# Patient Record
Sex: Female | Born: 1940 | Race: White | Hispanic: No | Marital: Married | Smoking: Never smoker
Health system: Southern US, Community
[De-identification: ages and names within clinical notes are randomized; demographics above are authoritative.]

## PROBLEM LIST (undated history)

## (undated) DIAGNOSIS — H269 Unspecified cataract: Secondary | ICD-10-CM

## (undated) DIAGNOSIS — Z923 Personal history of irradiation: Secondary | ICD-10-CM

## (undated) DIAGNOSIS — T7840XA Allergy, unspecified, initial encounter: Secondary | ICD-10-CM

## (undated) DIAGNOSIS — M1990 Unspecified osteoarthritis, unspecified site: Secondary | ICD-10-CM

## (undated) DIAGNOSIS — E079 Disorder of thyroid, unspecified: Secondary | ICD-10-CM

## (undated) HISTORY — DX: Disorder of thyroid, unspecified: E07.9

## (undated) HISTORY — DX: Unspecified cataract: H26.9

## (undated) HISTORY — PX: KNEE SURGERY: SHX244

## (undated) HISTORY — PX: DG  BONE DENSITY (ARMC HX): HXRAD1102

## (undated) HISTORY — DX: Unspecified osteoarthritis, unspecified site: M19.90

## (undated) HISTORY — PX: TUBAL LIGATION: SHX77

## (undated) HISTORY — DX: Allergy, unspecified, initial encounter: T78.40XA

---

## 2002-10-06 ENCOUNTER — Encounter: Payer: Self-pay | Admitting: Internal Medicine

## 2002-10-06 ENCOUNTER — Encounter: Admission: RE | Admit: 2002-10-06 | Discharge: 2002-10-06 | Payer: Self-pay | Admitting: Internal Medicine

## 2002-10-15 ENCOUNTER — Ambulatory Visit (HOSPITAL_COMMUNITY): Admission: RE | Admit: 2002-10-15 | Discharge: 2002-10-15 | Payer: Self-pay | Admitting: Gastroenterology

## 2002-12-22 ENCOUNTER — Encounter: Admission: RE | Admit: 2002-12-22 | Discharge: 2002-12-22 | Payer: Self-pay | Admitting: Internal Medicine

## 2002-12-22 ENCOUNTER — Encounter: Payer: Self-pay | Admitting: Internal Medicine

## 2003-11-09 ENCOUNTER — Encounter: Admission: RE | Admit: 2003-11-09 | Discharge: 2003-11-09 | Payer: Self-pay | Admitting: Internal Medicine

## 2004-11-14 ENCOUNTER — Encounter: Admission: RE | Admit: 2004-11-14 | Discharge: 2004-11-14 | Payer: Self-pay | Admitting: Internal Medicine

## 2005-11-18 ENCOUNTER — Encounter: Admission: RE | Admit: 2005-11-18 | Discharge: 2005-11-18 | Payer: Self-pay | Admitting: Internal Medicine

## 2007-03-20 ENCOUNTER — Encounter: Admission: RE | Admit: 2007-03-20 | Discharge: 2007-03-20 | Payer: Self-pay | Admitting: Emergency Medicine

## 2008-03-28 ENCOUNTER — Encounter: Admission: RE | Admit: 2008-03-28 | Discharge: 2008-03-28 | Payer: Self-pay | Admitting: Emergency Medicine

## 2009-03-31 ENCOUNTER — Encounter: Admission: RE | Admit: 2009-03-31 | Discharge: 2009-03-31 | Payer: Self-pay | Admitting: Emergency Medicine

## 2010-04-01 ENCOUNTER — Encounter: Payer: Self-pay | Admitting: Emergency Medicine

## 2010-04-09 ENCOUNTER — Encounter
Admission: RE | Admit: 2010-04-09 | Discharge: 2010-04-09 | Payer: Self-pay | Source: Home / Self Care | Attending: Emergency Medicine | Admitting: Emergency Medicine

## 2010-05-14 ENCOUNTER — Other Ambulatory Visit: Payer: Self-pay | Admitting: Emergency Medicine

## 2010-05-14 ENCOUNTER — Ambulatory Visit
Admission: RE | Admit: 2010-05-14 | Discharge: 2010-05-14 | Disposition: A | Discharge status: Home / Self Care | Payer: Self-pay | Source: Ambulatory Visit | Attending: Emergency Medicine | Admitting: Emergency Medicine

## 2010-05-14 DIAGNOSIS — M25569 Pain in unspecified knee: Secondary | ICD-10-CM

## 2010-06-05 ENCOUNTER — Ambulatory Visit (HOSPITAL_BASED_OUTPATIENT_CLINIC_OR_DEPARTMENT_OTHER)
Admission: RE | Admit: 2010-06-05 | Discharge: 2010-06-05 | Disposition: A | Discharge status: Home / Self Care | Payer: Medicare Other | Source: Ambulatory Visit | Attending: Orthopaedic Surgery | Admitting: Orthopaedic Surgery

## 2010-06-05 DIAGNOSIS — Z01810 Encounter for preprocedural cardiovascular examination: Secondary | ICD-10-CM | POA: Insufficient documentation

## 2010-06-05 DIAGNOSIS — Z01812 Encounter for preprocedural laboratory examination: Secondary | ICD-10-CM | POA: Insufficient documentation

## 2010-06-05 DIAGNOSIS — M2240 Chondromalacia patellae, unspecified knee: Secondary | ICD-10-CM | POA: Insufficient documentation

## 2010-06-05 DIAGNOSIS — M23329 Other meniscus derangements, posterior horn of medial meniscus, unspecified knee: Secondary | ICD-10-CM | POA: Insufficient documentation

## 2010-06-05 LAB — POCT HEMOGLOBIN-HEMACUE: Hemoglobin: 12.7 g/dL (ref 12.0–15.0)

## 2010-06-16 NOTE — Op Note (Signed)
NAMEKJERSTEN, ORMISTON NO.:  [id number]  MEDICAL RECORD NO.:  [id number]           PATIENT TYPE:  LOCATION:                                 FACILITY:  PHYSICIAN:  Lubertha Basque. Jerl Santos, M.D.     DATE OF BIRTH:  DATE OF PROCEDURE:  06/05/2010 DATE OF DISCHARGE:                              OPERATIVE REPORT   PREOPERATIVE DIAGNOSES: 1. Left knee torn medial meniscus. 2. Left knee chondromalacia.  POSTOPERATIVE DIAGNOSES: 1. Left knee torn medial meniscus. 2. Left knee chondromalacia.  PROCEDURES: 1. Left knee partial medial meniscectomy. 2. Left knee abrasion, chondroplasty, patellofemoral.  ANESTHESIA:  General.  ATTENDING SURGEON:  Lubertha Basque. Jerl Santos, MD  ASSISTANT:  Lindwood Qua, PA   INDICATIONS FOR PROCEDURE:  The patient is a 70 year old woman with a long history of intense left knee pain.  She has persisted with difficulty despite oral anti-inflammatories and an injection.  By MRI scan, she has a radial tear of the posterior horn of the medial meniscus.  She has pain which limits her ability to walk and rest, and she is offered an arthroscopy.  Informed operative consent was obtained after discussion of possible complications including reaction to anesthesia and infection.  SUMMARY, FINDINGS, AND PROCEDURE:  Under general anesthesia, an arthroscopy of the left knee was performed.  Suprapatellar pouch was benign while the patellofemoral joint exhibited some grade 3 and focal grade 4 change addressed with abrasion to bleeding bone in one tiny area along with a chondroplasty.  Medial compartment did exhibit a large radial tear of the posterior horn which went back to the root of the meniscus.  About 15% partial medial meniscectomy was required contouring this area and removing a substantial portion of the posterior horn at the root attachment.  She had some grade 3 changes here.  ACL was intact and the lateral compartment was  benign.  DESCRIPTION OF THE PROCEDURE:  The patient was taken to the operating suite where general anesthetic was applied without difficulty.  She was positioned supine and prepped and draped in normal sterile fashion. After administration of IV vancomycin, arthroscopy of the left knee was performed through total of two portals.  Findings were as noted above, and procedure consisted of the partial medial meniscectomy done with basket and shaver involving the posterior horn.  A brief chondroplasty was done in this compartment.  She also had abrasion to bleeding bone, patellofemoral.  The knee was thoroughly irrigated followed by placement of Marcaine with epinephrine and morphine.  Adaptic was placed over the portals, followed by dry gauze and loose Ace wrap.  Estimated blood loss and intraoperative fluids obtained from anesthesia records.  DISPOSITION:  The patient was extubated in the operating room and taken to recovery room in stable condition.  She was to go home same day and follow up in the office in less than 1 week.  I will contact her by phone tonight.     Lubertha Basque Jerl Santos, M.D.     PGD/MEDQ  D:  06/05/2010  T:  06/06/2010  Job:  [id number]  Electronically Signed by Marcene Corning  M.D. on 06/16/2010 01:12:27 PM

## 2010-07-27 NOTE — Op Note (Signed)
NAME:  Nicole Brennan, Nicole Brennan                                ACCOUNT NO.:  [id number]   MEDICAL RECORD NO.:  [id number]                   PATIENT TYPE:  AMB   LOCATION:  ENDO                                 FACILITY:  [facility]   PHYSICIAN:  Anselmo Rod, M.D.               DATE OF BIRTH:  [birth year]   DATE OF PROCEDURE:  10/15/2002  DATE OF DISCHARGE:                                 OPERATIVE REPORT   PROCEDURE:  Screening colonoscopy.   ENDOSCOPIST:  Charna Elizabeth, M.D.   INSTRUMENT USED:  Olympus video colonoscope.   INDICATIONS FOR PROCEDURE:  A 70 year old white female with a family history  of colon in her mother.  She is undergoing screening colonoscopy to rule out  colonic polyps, masses, etc.   PREPROCEDURE PREPARATION:  Informed consent was procured from the patient.  The patient fasted for eight hours prior to the procedure and prepped with a  bottle of magnesium citrate and a gallon of GOLYTELY the night prior to the  procedure.   PREPROCEDURE PHYSICAL EXAMINATION:  VITAL SIGNS:  The patient had stable  vital signs.  NECK:  Supple.  CHEST:  Clear to auscultation.  HEART:  S1 and S2 regular.  ABDOMEN:  Soft with normal bowel sounds.   DESCRIPTION OF PROCEDURE:  The patient was placed in the left lateral  decubitus position, sedated with 60 mg of Demerol and 6 mg of Versed  intravenously.  Once the patient was adequately sedated and maintained on  low flow oxygen and continuous cardiac monitoring, the Olympus video  colonoscope was advanced from the rectum to the cecum.  The appendiceal  orifice and ileocecal valve were visualized and photographed.  There was  some residual stool in the colon.  Multiple washings were done.  A few  scattered diverticula were seen throughout the colon in other stages of  formation.  Small internal hemorrhoids were seen on retroflexion in the  rectum.  No masses or polyps were noted.  The patient tolerated the  procedure well without  complications.   IMPRESSION:  1. Small nonbleeding internal hemorrhoids.  2. Scattered early diverticular disease.  3. No masses or polyps seen.   RECOMMENDATIONS:  1. A high fiber diet with liberal fluid intake has been advocated.  2.     Considering her family history of colon cancer in a first degree relative,     repeat colonoscopy has been recommended in the next 5 years unless the     patient develops any abnormal symptoms.  3. Outpatient followup as need arises in the future.                                               Anselmo Rod, M.D.  JNM/MEDQ  D:  10/15/2002  T:  10/15/2002  Job:  [id number]   cc:   Olene Craven, M.D.  [address]  [address]  [place]  [place] [address]  Fax: [phone]

## 2011-03-13 ENCOUNTER — Other Ambulatory Visit: Payer: Self-pay | Admitting: Emergency Medicine

## 2011-03-13 DIAGNOSIS — Z1231 Encounter for screening mammogram for malignant neoplasm of breast: Secondary | ICD-10-CM

## 2011-04-11 ENCOUNTER — Ambulatory Visit
Admission: RE | Admit: 2011-04-11 | Discharge: 2011-04-11 | Disposition: A | Discharge status: Home / Self Care | Payer: Medicare Other | Source: Ambulatory Visit | Attending: Emergency Medicine | Admitting: Emergency Medicine

## 2011-04-11 DIAGNOSIS — Z1231 Encounter for screening mammogram for malignant neoplasm of breast: Secondary | ICD-10-CM

## 2011-04-19 ENCOUNTER — Other Ambulatory Visit: Payer: Self-pay | Admitting: Physician Assistant

## 2011-04-19 MED ORDER — LEVOTHYROXINE SODIUM 75 MCG PO TABS: 75.0000 ug | ORAL_TABLET | Freq: Every day | ORAL | Status: DC

## 2011-04-30 ENCOUNTER — Encounter: Payer: Self-pay | Admitting: Physician Assistant

## 2011-04-30 ENCOUNTER — Ambulatory Visit (INDEPENDENT_AMBULATORY_CARE_PROVIDER_SITE_OTHER): Payer: Medicare Other | Admitting: Emergency Medicine

## 2011-04-30 ENCOUNTER — Ambulatory Visit: Payer: Medicare Other

## 2011-04-30 DIAGNOSIS — E039 Hypothyroidism, unspecified: Secondary | ICD-10-CM

## 2011-04-30 DIAGNOSIS — Z13220 Encounter for screening for lipoid disorders: Secondary | ICD-10-CM

## 2011-04-30 DIAGNOSIS — M1990 Unspecified osteoarthritis, unspecified site: Secondary | ICD-10-CM | POA: Insufficient documentation

## 2011-04-30 DIAGNOSIS — M7989 Other specified soft tissue disorders: Secondary | ICD-10-CM

## 2011-04-30 DIAGNOSIS — Z0000 Encounter for general adult medical examination without abnormal findings: Secondary | ICD-10-CM

## 2011-04-30 LAB — POCT URINALYSIS DIPSTICK
Bilirubin, UA: NEGATIVE
Blood, UA: NEGATIVE
Glucose, UA: NEGATIVE
Spec Grav, UA: 1.02

## 2011-04-30 LAB — LIPID PANEL
Cholesterol: 189 mg/dL (ref 0–200)
Triglycerides: 98 mg/dL (ref <150)

## 2011-04-30 LAB — CBC WITH DIFFERENTIAL/PLATELET
Basophils Relative: 1 % (ref 0–1)
HCT: 40.8 % (ref 36.0–46.0)
Hemoglobin: 13.6 g/dL (ref 12.0–15.0)
Lymphocytes Relative: 37 % (ref 12–46)
MCHC: 33.3 g/dL (ref 30.0–36.0)
Monocytes Absolute: 0.4 10*3/uL (ref 0.1–1.0)
Monocytes Relative: 8 % (ref 3–12)
Neutro Abs: 2.4 10*3/uL (ref 1.7–7.7)

## 2011-04-30 LAB — COMPREHENSIVE METABOLIC PANEL
Albumin: 4.4 g/dL (ref 3.5–5.2)
BUN: 14 mg/dL (ref 6–23)
Calcium: 10 mg/dL (ref 8.4–10.5)
Chloride: 105 mEq/L (ref 96–112)
Glucose, Bld: 88 mg/dL (ref 70–99)
Potassium: 4.2 mEq/L (ref 3.5–5.3)

## 2011-04-30 LAB — TSH: TSH: 3.504 u[IU]/mL (ref 0.350–4.500)

## 2011-04-30 MED ORDER — ALENDRONATE SODIUM 70 MG PO TABS: 70.0000 mg | ORAL_TABLET | ORAL | Status: DC

## 2011-04-30 MED ORDER — LEVOTHYROXINE SODIUM 75 MCG PO TABS: 75.0000 ug | ORAL_TABLET | Freq: Every day | ORAL | Status: DC

## 2011-04-30 NOTE — Progress Notes (Signed)
Subjective:    Patient ID: Nicole Brennan, female    DOB: 05/03/1940, 71 y.o.   MRN: [id number]  HPI [name] is here today for an annual physical. Overall, she has been feeling great.    Hypothyroidism: No concerns.  Has been watching her weight and participated in Weight Watchers recently and is pleased with her 15 lb weight loss.   Left 4th finger has been swollen for 2 months with some pain.  She has been playing piano more around the holidays and still travels and plays ~ 2-4 hours/day.  She can not get her wedding ring off although she says her ring is not tight.   [facility] UTD Colonoscopy 2010   Review of Systems  Constitutional: Negative.   HENT: Negative.  Facial swelling: Up to date on dental exam.   Eyes: Negative.        Up to date on eye exam  Respiratory: Negative.   Cardiovascular: Negative.   Musculoskeletal: Positive for joint swelling (left hand 4th phalanx PIP).  Skin: Negative.   Neurological: Negative.   Psychiatric/Behavioral: Negative.        Objective:   Physical Exam  Constitutional: She is oriented to person, place, and time. She appears well-developed and well-nourished.  HENT:  Right Ear: External ear normal.  Left Ear: External ear normal.  Nose: Nose normal.  Mouth/Throat: Oropharynx is clear and moist.  Eyes: Conjunctivae and EOM are normal. Pupils are equal, round, and reactive to light. Scleral icterus is present.  Neck: Normal range of motion. No thyromegaly present.  Cardiovascular: Normal rate, regular rhythm and normal heart sounds.   Pulmonary/Chest: Effort normal and breath sounds normal.  Abdominal: Soft. Bowel sounds are normal. There is no tenderness.  Musculoskeletal:       Left hand 4th phalanx PIP with swelling and mild tenderness.  No erythema.  Multiple DIP jts with minimal swelling.  Lymphadenopathy:    She has no cervical adenopathy.  Neurological: She is alert and oriented to person, place, and time. She has normal reflexes.    Skin: Skin is warm.    Results for orders placed in visit on 04/30/11  CBC WITH DIFFERENTIAL      Component Value Range   WBC 4.8  4.0 - 10.5 (K/uL)   RBC 4.29  3.87 - 5.11 (MIL/uL)   Hemoglobin 13.6  12.0 - 15.0 (g/dL)   HCT 40.9  81.1 - 91.4 (%)   MCV 95.1  78.0 - 100.0 (fL)   MCH 31.7  26.0 - 34.0 (pg)   MCHC 33.3  30.0 - 36.0 (g/dL)   RDW 78.2  95.6 - 21.3 (%)   Platelets 343  150 - 400 (K/uL)   Neutrophils Relative 51  43 - 77 (%)   Neutro Abs 2.4  1.7 - 7.7 (K/uL)   Lymphocytes Relative 37  12 - 46 (%)   Lymphs Abs 1.7  0.7 - 4.0 (K/uL)   Monocytes Relative 8  3 - 12 (%)   Monocytes Absolute 0.4  0.1 - 1.0 (K/uL)   Eosinophils Relative 4  0 - 5 (%)   Eosinophils Absolute 0.2  0.0 - 0.7 (K/uL)   Basophils Relative 1  0 - 1 (%)   Basophils Absolute 0.0  0.0 - 0.1 (K/uL)   Smear Review Criteria for review not met    COMPREHENSIVE METABOLIC PANEL      Component Value Range   Sodium 142  135 - 145 (mEq/L)   Potassium 4.2  3.5 -  5.3 (mEq/L)   Chloride 105  96 - 112 (mEq/L)   CO2 27  19 - 32 (mEq/L)   Glucose, Bld 88  70 - 99 (mg/dL)   BUN 14  6 - 23 (mg/dL)   Creat 4.09  8.11 - 9.14 (mg/dL)   Total Bilirubin 0.5  0.3 - 1.2 (mg/dL)   Alkaline Phosphatase 58  39 - 117 (U/L)   AST 22  0 - 37 (U/L)   ALT 20  0 - 35 (U/L)   Total Protein 7.0  6.0 - 8.3 (g/dL)   Albumin 4.4  3.5 - 5.2 (g/dL)   Calcium 78.2  8.4 - 10.5 (mg/dL)  TSH      Component Value Range   TSH 3.504  0.350 - 4.500 (uIU/mL)  LIPID PANEL      Component Value Range   Cholesterol 189  0 - 200 (mg/dL)   Triglycerides 98  <956 (mg/dL)   HDL 66  >21 (mg/dL)   Total CHOL/HDL Ratio 2.9     VLDL 20  0 - 40 (mg/dL)   LDL Cholesterol 308 (*) 0 - 99 (mg/dL)  POCT URINALYSIS DIPSTICK      Component Value Range   Color, UA yellow     Clarity, UA clear     Glucose, UA neg     Bilirubin, UA neg     Ketones, UA neg     Spec Grav, UA 1.020     Blood, UA neg     pH, UA 7.0     Protein, UA neg      Urobilinogen, UA 0.2     Nitrite, UA neg     Leukocytes, UA Negative       [facility] reading (PRIMARY) by  Dr. Cleta Alberts.  Left 4th finger Significant jt space narrowing noted PIP and DIP c/w OA     Assessment & Plan:  Annual Screening Exam Hypothyroidism Osteoarthritis Finger pain /swelling  CBC/Diff, CMET, TSH, Lipid profile Recommend Paraffin baths for OA pain relief; Tylenol QD. Encouraged continuing exercise and diet management Refill Synthroid 75 mcg and Fosamax 70 mg.

## 2011-04-30 NOTE — Patient Instructions (Addendum)
Use paraffin bath for pain relief in hands. Tylenol for arthritis pain

## 2011-05-01 DIAGNOSIS — M810 Age-related osteoporosis without current pathological fracture: Secondary | ICD-10-CM | POA: Insufficient documentation

## 2012-03-16 ENCOUNTER — Other Ambulatory Visit: Payer: Self-pay | Admitting: Emergency Medicine

## 2012-03-16 DIAGNOSIS — Z1231 Encounter for screening mammogram for malignant neoplasm of breast: Secondary | ICD-10-CM

## 2012-04-12 ENCOUNTER — Telehealth: Payer: Self-pay | Admitting: *Deleted

## 2012-04-12 NOTE — Telephone Encounter (Signed)
[facility] requesting refill on alendronate 70mg .  Last fill 01/18/12 #12

## 2012-04-13 ENCOUNTER — Ambulatory Visit
Admission: RE | Admit: 2012-04-13 | Discharge: 2012-04-13 | Disposition: A | Discharge status: Home / Self Care | Payer: Medicare Other | Source: Ambulatory Visit | Attending: Emergency Medicine | Admitting: Emergency Medicine

## 2012-04-13 DIAGNOSIS — Z1231 Encounter for screening mammogram for malignant neoplasm of breast: Secondary | ICD-10-CM

## 2012-04-13 MED ORDER — ALENDRONATE SODIUM 70 MG PO TABS: 70.0000 mg | ORAL_TABLET | ORAL | Status: DC

## 2012-04-13 NOTE — Telephone Encounter (Signed)
1 month supply sent to pharmacy, but will need OV for additional refills

## 2012-04-21 ENCOUNTER — Encounter: Payer: Self-pay | Admitting: Emergency Medicine

## 2012-04-21 ENCOUNTER — Ambulatory Visit (INDEPENDENT_AMBULATORY_CARE_PROVIDER_SITE_OTHER): Payer: Medicare Other | Admitting: Emergency Medicine

## 2012-04-21 VITALS — BP 118/60 | HR 69 | Temp 98.0°F | Resp 16 | Ht 65.5 in | Wt 190.0 lb

## 2012-04-21 DIAGNOSIS — H269 Unspecified cataract: Secondary | ICD-10-CM | POA: Insufficient documentation

## 2012-04-21 DIAGNOSIS — M810 Age-related osteoporosis without current pathological fracture: Secondary | ICD-10-CM

## 2012-04-21 DIAGNOSIS — M79609 Pain in unspecified limb: Secondary | ICD-10-CM

## 2012-04-21 DIAGNOSIS — D235 Other benign neoplasm of skin of trunk: Secondary | ICD-10-CM

## 2012-04-21 DIAGNOSIS — J302 Other seasonal allergic rhinitis: Secondary | ICD-10-CM | POA: Insufficient documentation

## 2012-04-21 DIAGNOSIS — M79646 Pain in unspecified finger(s): Secondary | ICD-10-CM

## 2012-04-21 DIAGNOSIS — R635 Abnormal weight gain: Secondary | ICD-10-CM

## 2012-04-21 DIAGNOSIS — E785 Hyperlipidemia, unspecified: Secondary | ICD-10-CM

## 2012-04-21 LAB — COMPREHENSIVE METABOLIC PANEL
CO2: 26 mEq/L (ref 19–32)
Calcium: 10.1 mg/dL (ref 8.4–10.5)
Chloride: 107 mEq/L (ref 96–112)
Glucose, Bld: 85 mg/dL (ref 70–99)
Sodium: 141 mEq/L (ref 135–145)
Total Bilirubin: 0.5 mg/dL (ref 0.3–1.2)
Total Protein: 6.8 g/dL (ref 6.0–8.3)

## 2012-04-21 LAB — CBC WITH DIFFERENTIAL/PLATELET
Eosinophils Absolute: 0.2 10*3/uL (ref 0.0–0.7)
Eosinophils Relative: 4 % (ref 0–5)
Hemoglobin: 13.1 g/dL (ref 12.0–15.0)
Lymphocytes Relative: 33 % (ref 12–46)
Lymphs Abs: 1.7 10*3/uL (ref 0.7–4.0)
MCH: 31.8 pg (ref 26.0–34.0)
MCV: 94.7 fL (ref 78.0–100.0)
Monocytes Relative: 8 % (ref 3–12)
Neutrophils Relative %: 54 % (ref 43–77)
RBC: 4.12 MIL/uL (ref 3.87–5.11)

## 2012-04-21 LAB — LIPID PANEL
Cholesterol: 185 mg/dL (ref 0–200)
Triglycerides: 124 mg/dL (ref <150)
VLDL: 25 mg/dL (ref 0–40)

## 2012-04-21 LAB — T4, FREE: Free T4: 1.23 ng/dL (ref 0.80–1.80)

## 2012-04-21 NOTE — Progress Notes (Signed)
  Subjective:    Patient ID: Nicole Brennan, female    DOB: 03/23/1940, 72 y.o.   MRN: [id number]  HPI patient here to followup on hypothyroidism on replacement. She also has osteoporosis and takes Fosamax for this. She has been doing well except weight gain over the holidays with she like her thyroid checked be sure this is not going on.    Review of Systems there is a 3 x 4 mm, don't present over the mid back at about the L2 level .     Objective:   Physical Exam H. EENT exam is unremarkable. Neck is supple. Chest is clear to auscultation and percussion. Heart regular rate no murmurs rubs or gallops appreciated abdomen is soft nontender. Over the mid back there is a 3 x 4 mm blackhead. The material was removed from the inside with an 18-gauge needle without difficulty.        Assessment & Plan:  Routine labs were done today next physical in about 6 months

## 2012-04-22 ENCOUNTER — Encounter: Payer: Self-pay | Admitting: *Deleted

## 2012-08-06 ENCOUNTER — Other Ambulatory Visit: Payer: Self-pay

## 2012-08-06 MED ORDER — ALENDRONATE SODIUM 70 MG PO TABS: 70.0000 mg | ORAL_TABLET | ORAL | Status: DC

## 2012-09-08 ENCOUNTER — Other Ambulatory Visit: Payer: Self-pay | Admitting: Emergency Medicine

## 2012-10-13 ENCOUNTER — Ambulatory Visit (INDEPENDENT_AMBULATORY_CARE_PROVIDER_SITE_OTHER): Payer: Medicare Other | Admitting: Emergency Medicine

## 2012-10-13 ENCOUNTER — Encounter: Payer: Self-pay | Admitting: Emergency Medicine

## 2012-10-13 VITALS — BP 128/76 | HR 64 | Temp 97.9°F | Resp 16 | Ht 65.75 in | Wt 184.8 lb

## 2012-10-13 DIAGNOSIS — Z0000 Encounter for general adult medical examination without abnormal findings: Secondary | ICD-10-CM

## 2012-10-13 DIAGNOSIS — Z139 Encounter for screening, unspecified: Secondary | ICD-10-CM

## 2012-10-13 DIAGNOSIS — E559 Vitamin D deficiency, unspecified: Secondary | ICD-10-CM

## 2012-10-13 DIAGNOSIS — E039 Hypothyroidism, unspecified: Secondary | ICD-10-CM

## 2012-10-13 DIAGNOSIS — M810 Age-related osteoporosis without current pathological fracture: Secondary | ICD-10-CM

## 2012-10-13 LAB — COMPREHENSIVE METABOLIC PANEL
AST: 25 U/L (ref 0–37)
Albumin: 4.3 g/dL (ref 3.5–5.2)
BUN: 15 mg/dL (ref 6–23)
CO2: 26 mEq/L (ref 19–32)
Calcium: 9.4 mg/dL (ref 8.4–10.5)
Chloride: 108 mEq/L (ref 96–112)
Creat: 0.89 mg/dL (ref 0.50–1.10)
Potassium: 4.2 mEq/L (ref 3.5–5.3)

## 2012-10-13 LAB — POCT URINALYSIS DIPSTICK
Bilirubin, UA: NEGATIVE
Blood, UA: NEGATIVE
Glucose, UA: NEGATIVE
Nitrite, UA: NEGATIVE
Spec Grav, UA: 1.015
Urobilinogen, UA: 0.2

## 2012-10-13 LAB — CBC WITH DIFFERENTIAL/PLATELET
Eosinophils Relative: 5 % (ref 0–5)
HCT: 39.3 % (ref 36.0–46.0)
Hemoglobin: 13.1 g/dL (ref 12.0–15.0)
Lymphocytes Relative: 33 % (ref 12–46)
Lymphs Abs: 1.7 10*3/uL (ref 0.7–4.0)
MCV: 92.9 fL (ref 78.0–100.0)
Monocytes Absolute: 0.4 10*3/uL (ref 0.1–1.0)
Monocytes Relative: 8 % (ref 3–12)
Neutro Abs: 2.8 10*3/uL (ref 1.7–7.7)
RBC: 4.23 MIL/uL (ref 3.87–5.11)
WBC: 5.2 10*3/uL (ref 4.0–10.5)

## 2012-10-13 LAB — IFOBT (OCCULT BLOOD): IFOBT: NEGATIVE

## 2012-10-13 LAB — TSH: TSH: 1.867 u[IU]/mL (ref 0.350–4.500)

## 2012-10-13 LAB — LIPID PANEL
Cholesterol: 169 mg/dL (ref 0–200)
HDL: 54 mg/dL (ref >39)
Total CHOL/HDL Ratio: 3.1 Ratio

## 2012-10-13 MED ORDER — LEVOTHYROXINE SODIUM 75 MCG PO TABS: ORAL_TABLET | ORAL | Status: DC

## 2012-10-13 NOTE — Progress Notes (Signed)
  Subjective:    Patient ID: Nicole Brennan, female    DOB: [birth year], 72 y.o.   MRN: [id number]  HPI  72 year old female here for annual physical.  Larey Seat last night and hit the right side of body.  Feels fine over all.  Thyroid medicine is working well.  Keeps a busy schedule and works all the time.  Has not been to the hand doctor yet but she is going to schedule the appointment.  Walking some but not as much as she should.  Up to date on colonoscopy.  Has had the shingles vaccine.  Has had the pneumonia vaccine.  Has also had the Hep B shots.      Review of Systems  Constitutional: Positive for unexpected weight change.  HENT: Negative.   Eyes: Negative.   Respiratory: Negative.   Cardiovascular: Negative.   Gastrointestinal: Positive for constipation.  Endocrine: Negative.        Thyroid abnormalities  Genitourinary: Positive for frequency.  Musculoskeletal: Positive for myalgias and joint swelling.  Skin: Negative.   Allergic/Immunologic: Negative.   Neurological: Negative.   Hematological: Negative.   Psychiatric/Behavioral: Negative.        Objective:   Physical Exam HEENT exam is unremarkable. Her neck is supple. Her chest is clear to auscultation and percussion. Cardiac exam reveals a regular rate without murmurs rubs or gallops. Abdomen is obese liver and spleen not enlarged there are no areas of tenderness. GU exam reveals a normal female there are no adnexal masses palpable the vulva appear normal without lesions rectal exam confirms above and no masses are felt. Her extremities are without edema her pulses are 2+ and symmetrical. Her breast exam reveals no tenderness and no masses.        Assessment & Plan:  Meds are refilled. I encouraged her to see Dr. Amanda Pea regarding her hand . Synthroid will be refilled .

## 2012-10-14 LAB — PAP IG (IMAGE GUIDED)

## 2012-10-14 LAB — VITAMIN D 25 HYDROXY (VIT D DEFICIENCY, FRACTURES): Vit D, 25-Hydroxy: 51 ng/mL (ref 30–89)

## 2012-12-14 ENCOUNTER — Other Ambulatory Visit: Payer: Self-pay | Admitting: Emergency Medicine

## 2013-03-12 ENCOUNTER — Other Ambulatory Visit: Payer: Self-pay | Admitting: Physician Assistant

## 2013-03-15 ENCOUNTER — Other Ambulatory Visit: Payer: Self-pay

## 2013-03-15 DIAGNOSIS — Z1231 Encounter for screening mammogram for malignant neoplasm of breast: Secondary | ICD-10-CM

## 2013-04-07 ENCOUNTER — Telehealth: Payer: Self-pay

## 2013-04-07 DIAGNOSIS — E039 Hypothyroidism, unspecified: Secondary | ICD-10-CM

## 2013-04-07 NOTE — Telephone Encounter (Signed)
I changed pharmacy. Can we refill?

## 2013-04-07 NOTE — Telephone Encounter (Signed)
Pt would like a refill on levothyroxine, and fosamax, she states that she has an upcoming appt on Feb.17, 2015.  Pharmacy: [facility]; pt no longer uses [facility] due to them not being in network with her insurance. Best# [phone]

## 2013-04-08 MED ORDER — LEVOTHYROXINE SODIUM 75 MCG PO TABS: ORAL_TABLET | ORAL | Status: DC

## 2013-04-08 MED ORDER — ALENDRONATE SODIUM 70 MG PO TABS: ORAL_TABLET | ORAL | Status: DC

## 2013-04-08 NOTE — Telephone Encounter (Signed)
Pt aware.

## 2013-04-08 NOTE — Telephone Encounter (Signed)
Meds ordered this encounter  Medications  . alendronate (FOSAMAX) 70 MG tablet    Sig: TAKE 1 TABLET EVERY WEEK IN THE AM WITH A GLASS OF WATER.DO NOT LIE DOWN OR EAT ANYTHING FOR AT LEAST 30 MINUTES.    Dispense:  4 tablet    Refill:  0    Order Specific Question:  Supervising Provider    Answer:  DOOLITTLE, ROBERT P [[id number]]  . levothyroxine (SYNTHROID, LEVOTHROID) 75 MCG tablet    Sig: TAKE ONE (1) TABLET EACH DAY    Dispense:  30 tablet    Refill:  0    Order Specific Question:  Supervising Provider    Answer:  DOOLITTLE, ROBERT P [[id number]]

## 2013-04-16 ENCOUNTER — Ambulatory Visit
Admission: RE | Admit: 2013-04-16 | Discharge: 2013-04-16 | Disposition: A | Discharge status: Home / Self Care | Payer: Medicare Other | Source: Ambulatory Visit

## 2013-04-16 DIAGNOSIS — Z1231 Encounter for screening mammogram for malignant neoplasm of breast: Secondary | ICD-10-CM | POA: Diagnosis not present

## 2013-04-27 ENCOUNTER — Ambulatory Visit: Payer: Medicare Other | Admitting: Emergency Medicine

## 2013-04-30 ENCOUNTER — Ambulatory Visit (INDEPENDENT_AMBULATORY_CARE_PROVIDER_SITE_OTHER): Payer: Medicare Other | Admitting: Emergency Medicine

## 2013-04-30 ENCOUNTER — Encounter: Payer: Self-pay | Admitting: Emergency Medicine

## 2013-04-30 VITALS — BP 120/70 | HR 67 | Temp 98.1°F | Resp 16 | Ht 65.5 in | Wt 187.0 lb

## 2013-04-30 DIAGNOSIS — E559 Vitamin D deficiency, unspecified: Secondary | ICD-10-CM

## 2013-04-30 DIAGNOSIS — M25569 Pain in unspecified knee: Secondary | ICD-10-CM

## 2013-04-30 DIAGNOSIS — E039 Hypothyroidism, unspecified: Secondary | ICD-10-CM

## 2013-04-30 DIAGNOSIS — M25551 Pain in right hip: Secondary | ICD-10-CM

## 2013-04-30 DIAGNOSIS — M810 Age-related osteoporosis without current pathological fracture: Secondary | ICD-10-CM

## 2013-04-30 MED ORDER — LEVOTHYROXINE SODIUM 75 MCG PO TABS
ORAL_TABLET | ORAL | Status: DC
Start: 2013-04-30 — End: 2013-04-30

## 2013-04-30 MED ORDER — ALENDRONATE SODIUM 70 MG PO TABS: ORAL_TABLET | ORAL | Status: DC

## 2013-04-30 MED ORDER — LEVOTHYROXINE SODIUM 75 MCG PO TABS: ORAL_TABLET | ORAL | Status: DC

## 2013-04-30 NOTE — Progress Notes (Signed)
   Subjective:    Patient ID: Nicole Brennan, female    DOB: January 04, 1941, 73 y.o.   MRN: [id number]  HPI   Patient here for medication refills on synthroid and fosamax.  Patient has been feeling good.  Has a little pain and limp in lower right back and hip.  Hurts when walking or when lying on back.  Occasionally has a leg cramp.  Finger pain on right middle finger.  Thinks she might have something in the finger.  Finger is a little tender.  Went to hand doctor for left ring finger but they decided not to do anything right now.      Review of Systems     Objective:   Physical Exam patient is alert and cooperative she is in no distress. Her neck is supple. Chest was clear. Heart regular rate no murmurs. Abdomen is soft nontender extremities without edema .        Assessment & Plan:  Meds refill. She is to return to clinic in 6 months for her regular  Physical .

## 2013-06-24 ENCOUNTER — Ambulatory Visit (INDEPENDENT_AMBULATORY_CARE_PROVIDER_SITE_OTHER): Payer: Medicare Other | Admitting: Emergency Medicine

## 2013-06-24 VITALS — BP 120/80 | HR 62 | Temp 97.4°F | Resp 16 | Ht 65.0 in | Wt 188.0 lb

## 2013-06-24 DIAGNOSIS — J029 Acute pharyngitis, unspecified: Secondary | ICD-10-CM

## 2013-06-24 LAB — POCT RAPID STREP A (OFFICE): Rapid Strep A Screen: NEGATIVE

## 2013-06-24 MED ORDER — BENZONATATE 100 MG PO CAPS: 100.0000 mg | ORAL_CAPSULE | Freq: Three times a day (TID) | ORAL | Status: DC | PRN

## 2013-06-24 MED ORDER — FIRST-DUKES MOUTHWASH MT SUSP: OROMUCOSAL | Status: DC

## 2013-06-24 NOTE — Progress Notes (Signed)
   Subjective:    Patient ID: Nicole Brennan, female    DOB: 1940-12-03, 73 y.o.   MRN: [id number]  HPI This chart was scribed for Remo Lipps Hiyab Nhem-MD, by Lovena Le Day, Scribe. This patient was seen in room 10 and the patient's care was started at 9:34 AM.  HPI Comments: Nicole Brennan is a 73 y.o. female who presents to the [facility] for a sore throat since yesterday. 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 She denies any fever or cough. She denies any issues w/seasonal allergies this year. She reports mild rhinorrhea last PM. She has not have a tonsillectomy before.   Past Medical History  Diagnosis Date  . Allergy   . Arthritis   . Cataract     Allergies  Allergen Reactions  . Penicillins Hives    No orders of the defined types were placed in this encounter.    Review of Systems  Constitutional: Negative for fever and chills.  HENT: Positive for sore throat.   Respiratory: Negative for cough and shortness of breath.   Cardiovascular: Negative for chest pain.  Gastrointestinal: Negative for abdominal pain.  Musculoskeletal: Negative for back pain.      Objective:   Physical Exam Nursing note and vitals reviewed. Constitutional: Patient is oriented to person, place, and time. Patient appears well-developed and well-nourished. No distress.  HENT: Fluid behind right TM. The throat is slightly red Head: Normocephalic and atraumatic.  Neck: Neck supple. No tracheal deviation present.  Cardiovascular: Normal rate, regular rhythm and normal heart sounds.   No murmur heard. Pulmonary/Chest: Effort normal and breath sounds normal. No respiratory distress. Patient has no wheezes. Patient has no rales.  Musculoskeletal: Normal range of motion.  Neurological: Patient is alert and oriented to person, place, and time.  Skin: Skin is warm and dry.  Psychiatric: Patient has a normal mood and affect. Patient's behavior is normal.   Results for orders placed in visit on 06/24/13  POCT RAPID STREP A (OFFICE)      Result Value Ref Range   Rapid Strep A Screen Negative  Negative   Triage Vitals: BP 120/80  Pulse 62  Temp(Src) 97.4 F (36.3 C) (Oral)  Resp 16  Ht 5\' 5"  (1.651 m)  Wt 188 lb (85.276 kg)  BMI 31.28 kg/m2  SpO2 97%     Assessment & Plan:  DIAGNOSTIC STUDIES: Oxygen Saturation is 97% on room air, normal by my interpretation.    COORDINATION OF CARE: At 930 AM Discussed treatment plan with patient which includes rapid strept screen. Patient agrees.  I suspect this is allergic or viral related. Treatment Tessalon Perles and Dukes mouthwash and advised her to consider taking Claritin or Zyrtec one a day. I personally performed the services described in this documentation, which was scribed in my presence. The recorded information has been reviewed and is accurate.

## 2013-06-26 LAB — CULTURE, GROUP A STREP: Organism ID, Bacteria: NORMAL

## 2013-07-19 DIAGNOSIS — H2510 Age-related nuclear cataract, unspecified eye: Secondary | ICD-10-CM | POA: Diagnosis not present

## 2013-07-19 DIAGNOSIS — H04129 Dry eye syndrome of unspecified lacrimal gland: Secondary | ICD-10-CM | POA: Diagnosis not present

## 2013-07-19 DIAGNOSIS — H18419 Arcus senilis, unspecified eye: Secondary | ICD-10-CM | POA: Diagnosis not present

## 2013-07-19 DIAGNOSIS — H02839 Dermatochalasis of unspecified eye, unspecified eyelid: Secondary | ICD-10-CM | POA: Diagnosis not present

## 2013-08-23 DIAGNOSIS — H5200 Hypermetropia, unspecified eye: Secondary | ICD-10-CM | POA: Diagnosis not present

## 2013-08-23 DIAGNOSIS — H2510 Age-related nuclear cataract, unspecified eye: Secondary | ICD-10-CM | POA: Diagnosis not present

## 2013-08-23 DIAGNOSIS — H16229 Keratoconjunctivitis sicca, not specified as Sjogren's, unspecified eye: Secondary | ICD-10-CM | POA: Diagnosis not present

## 2013-10-25 ENCOUNTER — Ambulatory Visit (INDEPENDENT_AMBULATORY_CARE_PROVIDER_SITE_OTHER): Payer: Medicare Other | Admitting: Emergency Medicine

## 2013-10-25 ENCOUNTER — Encounter: Payer: Self-pay | Admitting: Emergency Medicine

## 2013-10-25 VITALS — BP 134/72 | HR 60 | Temp 98.3°F | Resp 16 | Ht 66.5 in | Wt 189.4 lb

## 2013-10-25 DIAGNOSIS — E038 Other specified hypothyroidism: Secondary | ICD-10-CM | POA: Diagnosis not present

## 2013-10-25 DIAGNOSIS — Z23 Encounter for immunization: Secondary | ICD-10-CM

## 2013-10-25 DIAGNOSIS — Z0000 Encounter for general adult medical examination without abnormal findings: Secondary | ICD-10-CM

## 2013-10-25 DIAGNOSIS — Z800 Family history of malignant neoplasm of digestive organs: Secondary | ICD-10-CM | POA: Diagnosis not present

## 2013-10-25 LAB — CBC WITH DIFFERENTIAL/PLATELET
BASOS ABS: 0 10*3/uL (ref 0.0–0.1)
BASOS PCT: 1 % (ref 0–1)
EOS PCT: 3 % (ref 0–5)
Eosinophils Absolute: 0.1 10*3/uL (ref 0.0–0.7)
HCT: 40.2 % (ref 36.0–46.0)
Hemoglobin: 13.5 g/dL (ref 12.0–15.0)
Lymphocytes Relative: 33 % (ref 12–46)
Lymphs Abs: 1.6 10*3/uL (ref 0.7–4.0)
MCH: 31.6 pg (ref 26.0–34.0)
MCHC: 33.6 g/dL (ref 30.0–36.0)
MCV: 94.1 fL (ref 78.0–100.0)
MONO ABS: 0.4 10*3/uL (ref 0.1–1.0)
Monocytes Relative: 8 % (ref 3–12)
NEUTROS ABS: 2.6 10*3/uL (ref 1.7–7.7)
Neutrophils Relative %: 55 % (ref 43–77)
Platelets: 371 10*3/uL (ref 150–400)
RBC: 4.27 MIL/uL (ref 3.87–5.11)
RDW: 13.8 % (ref 11.5–15.5)
WBC: 4.8 10*3/uL (ref 4.0–10.5)

## 2013-10-25 LAB — POCT URINALYSIS DIPSTICK
Bilirubin, UA: NEGATIVE
Blood, UA: NEGATIVE
Glucose, UA: NEGATIVE
KETONES UA: NEGATIVE
LEUKOCYTES UA: NEGATIVE
Nitrite, UA: NEGATIVE
PROTEIN UA: NEGATIVE
Spec Grav, UA: 1.01
UROBILINOGEN UA: 0.2
pH, UA: 6.5

## 2013-10-25 NOTE — Progress Notes (Signed)
   Subjective:    Patient ID: Nicole Brennan, female    DOB: January 16, 1941, 73 y.o.   MRN: [id number]  HPI  Patient is doing great.  Has a place on the inside corner of her right eye that is bothering her a little bit but other wise doing good.  Traveling with her music.  Had her mammogram this year and it was ok.      Review of Systems  Constitutional: Positive for activity change.  HENT: Negative.   Eyes: Negative.   Respiratory: Negative.   Cardiovascular: Negative.   Gastrointestinal: Negative.   Endocrine: Negative.   Genitourinary: Negative.   Musculoskeletal: Negative.   Skin: Negative.   Allergic/Immunologic: Negative.   Neurological: Negative.   Hematological: Negative.   Psychiatric/Behavioral: Negative.        Objective:   Physical Exam there is a 4 x 5 mm raised red area medial portion of the right upper lid. Pupils equal and reactive nose normal throat normal neck supple chest clear. Heart regular rate and rhythm. Abdomen soft liver spleen not enlarged no tenderness. Extremities without cyanosis clubbing or edema. Neurological cranial nerves II through XII intact. Motor 5 out of 5.  Results for orders placed in visit on 10/25/13  POCT URINALYSIS DIPSTICK      Result Value Ref Range   Color, UA yellow     Clarity, UA clear     Glucose, UA neg     Bilirubin, UA neg     Ketones, UA neg     Spec Grav, UA 1.010     Blood, UA neg     pH, UA 6.5     Protein, UA neg     Urobilinogen, UA 0.2     Nitrite, UA neg     Leukocytes, UA Negative          Assessment & Plan:  Patient's exam is normal. Routine labs were done. She is up-to-date on mammography. Have rescheduled her to see Dr. Collene Mares for a colonoscopy. She does have a family history of colon cancer. She was given Prevnar vaccine today.

## 2013-10-26 ENCOUNTER — Encounter: Payer: Self-pay | Admitting: Emergency Medicine

## 2013-10-26 ENCOUNTER — Telehealth: Payer: Self-pay | Admitting: Radiology

## 2013-10-26 LAB — COMPLETE METABOLIC PANEL WITH GFR
ALK PHOS: 61 U/L (ref 39–117)
ALT: 21 U/L (ref 0–35)
AST: 22 U/L (ref 0–37)
Albumin: 4.7 g/dL (ref 3.5–5.2)
BILIRUBIN TOTAL: 0.5 mg/dL (ref 0.2–1.2)
BUN: 15 mg/dL (ref 6–23)
CO2: 27 mEq/L (ref 19–32)
Calcium: 10.1 mg/dL (ref 8.4–10.5)
Chloride: 105 mEq/L (ref 96–112)
Creat: 0.81 mg/dL (ref 0.50–1.10)
GFR, Est African American: 85 mL/min
GFR, Est Non African American: 73 mL/min
Glucose, Bld: 84 mg/dL (ref 70–99)
Potassium: 4.4 mEq/L (ref 3.5–5.3)
Sodium: 142 mEq/L (ref 135–145)
Total Protein: 7 g/dL (ref 6.0–8.3)

## 2013-10-26 LAB — LIPID PANEL
CHOLESTEROL: 195 mg/dL (ref 0–200)
HDL: 58 mg/dL (ref >39)
LDL Cholesterol: 106 mg/dL — ABNORMAL HIGH (ref 0–99)
Total CHOL/HDL Ratio: 3.4 Ratio
Triglycerides: 156 mg/dL — ABNORMAL HIGH (ref <150)
VLDL: 31 mg/dL (ref 0–40)

## 2013-10-26 LAB — TSH: TSH: 3.881 u[IU]/mL (ref 0.350–4.500)

## 2013-10-26 NOTE — Telephone Encounter (Signed)
Will you talk to me about the vaccine administration / Prevnar that was given to the patient yesterday? Ext [phone] [address]ia

## 2013-10-26 NOTE — Telephone Encounter (Signed)
I do not believe that a 1 change in the temperature would have any appreciable affect on the quality of the Prevnar. I do think it would be fine to notify the patient of exactly what we know. I would not recommend a repeat vaccination I think that would be unsafe.

## 2013-10-27 ENCOUNTER — Encounter: Payer: Self-pay | Admitting: Family Medicine

## 2013-10-27 NOTE — Telephone Encounter (Signed)
Thank you the manufacturer has advised 1 degree is fine, had it dropped to 0 we would have to discard the remaining doses. I will advise patient. Recomended storage for prevnar is 2-8 degrees.

## 2013-11-03 ENCOUNTER — Telehealth: Payer: Self-pay

## 2013-11-03 NOTE — Telephone Encounter (Signed)
Advised pt results and advised letter to be sent.

## 2013-11-03 NOTE — Telephone Encounter (Signed)
DAUB - PT WANTS KNOW HER LAB RESULTS.  PLEASE CALL TODAY BEFORE LUNCH IF POSSIBLE AT [phone]

## 2013-11-13 DIAGNOSIS — M722 Plantar fascial fibromatosis: Secondary | ICD-10-CM | POA: Diagnosis not present

## 2013-11-13 DIAGNOSIS — M7660 Achilles tendinitis, unspecified leg: Secondary | ICD-10-CM | POA: Diagnosis not present

## 2013-11-30 DIAGNOSIS — M7660 Achilles tendinitis, unspecified leg: Secondary | ICD-10-CM | POA: Diagnosis not present

## 2013-11-30 DIAGNOSIS — M722 Plantar fascial fibromatosis: Secondary | ICD-10-CM | POA: Diagnosis not present

## 2013-12-21 ENCOUNTER — Ambulatory Visit (INDEPENDENT_AMBULATORY_CARE_PROVIDER_SITE_OTHER): Payer: Medicare Other | Admitting: *Deleted

## 2013-12-21 DIAGNOSIS — Z23 Encounter for immunization: Secondary | ICD-10-CM

## 2014-02-16 ENCOUNTER — Other Ambulatory Visit: Payer: Self-pay | Admitting: Emergency Medicine

## 2014-03-15 ENCOUNTER — Other Ambulatory Visit: Payer: Self-pay

## 2014-03-15 DIAGNOSIS — Z1231 Encounter for screening mammogram for malignant neoplasm of breast: Secondary | ICD-10-CM

## 2014-04-19 ENCOUNTER — Ambulatory Visit
Admission: RE | Admit: 2014-04-19 | Discharge: 2014-04-19 | Disposition: A | Discharge status: Home / Self Care | Payer: No Typology Code available for payment source | Source: Ambulatory Visit

## 2014-04-19 DIAGNOSIS — Z1231 Encounter for screening mammogram for malignant neoplasm of breast: Secondary | ICD-10-CM

## 2014-05-02 ENCOUNTER — Other Ambulatory Visit: Payer: Self-pay | Admitting: Emergency Medicine

## 2014-06-01 ENCOUNTER — Telehealth: Payer: Self-pay

## 2014-06-01 DIAGNOSIS — E038 Other specified hypothyroidism: Secondary | ICD-10-CM

## 2014-06-01 NOTE — Telephone Encounter (Signed)
Pt states she need a 90 day supply which will be less expensive of her LEVOTHYROXINE 75MG S AND ALENDRONATE 70MG S. Please call [facility] PHONE NUMBER IS [phone] AND HER ID [employer] NUMBER IS [id number] IF NEEDED

## 2014-06-03 MED ORDER — LEVOTHYROXINE SODIUM 75 MCG PO TABS: 75.0000 ug | ORAL_TABLET | Freq: Every day | ORAL | Status: DC

## 2014-06-03 NOTE — Telephone Encounter (Signed)
Spoke to pt, she is aware she will need an ov for future refills.  i am sending a refill in to supply her until her ov.

## 2014-06-14 ENCOUNTER — Encounter: Payer: Self-pay | Admitting: Emergency Medicine

## 2014-06-14 ENCOUNTER — Ambulatory Visit (INDEPENDENT_AMBULATORY_CARE_PROVIDER_SITE_OTHER): Payer: Medicare Other | Admitting: Emergency Medicine

## 2014-06-14 VITALS — BP 124/58 | HR 61 | Temp 97.7°F | Resp 16 | Ht 65.5 in | Wt 189.0 lb

## 2014-06-14 DIAGNOSIS — E038 Other specified hypothyroidism: Secondary | ICD-10-CM

## 2014-06-14 DIAGNOSIS — E039 Hypothyroidism, unspecified: Secondary | ICD-10-CM

## 2014-06-14 DIAGNOSIS — M810 Age-related osteoporosis without current pathological fracture: Secondary | ICD-10-CM | POA: Diagnosis not present

## 2014-06-14 DIAGNOSIS — E559 Vitamin D deficiency, unspecified: Secondary | ICD-10-CM | POA: Diagnosis not present

## 2014-06-14 MED ORDER — LEVOTHYROXINE SODIUM 75 MCG PO TABS: ORAL_TABLET | ORAL | Status: DC

## 2014-06-14 NOTE — Progress Notes (Signed)
   Subjective:  This chart was scribe for Nicole Russian, MD by Judithann Sauger, ED Scribe. The patient was seen in Room 21 and the patient's care was started at 4:39 PM.    Patient ID: Nicole Brennan, female    DOB: [birth year], 74 y.o.   MRN: [id number]  HPI HPI Comments: Nicole Brennan is a 74 y.o. female with a hx of thyroid disease, arthritis, allergy, and cataract who presents to the [facility] for blood work and medication refill. She would want a 90 day refill so that she is not charged. She reports that she has been non-compliant with her Fosamax because of her broken bone risk. She adds that she has been on Fosamax for about 12 years. She reports that when she wakes up, she is a little stiff. She denies performing any weight bearing exercises or walking a lot. She is unsure the last time she had a bone density done. She also reports that she is UTD on her mammogram.   Past Medical History  Diagnosis Date  . Allergy   . Arthritis   . Cataract   . Thyroid disease    Past Surgical History  Procedure Laterality Date  . Knee surgery    . Tubal ligation     Allergies  Allergen Reactions  . Penicillins Hives      Review of Systems  Constitutional: Negative for fever, chills and fatigue.  Respiratory: Negative for shortness of breath.   Musculoskeletal: Negative for myalgias.       Objective:   Physical Exam  Vitals reviewed.  CONSTITUTIONAL: Well developed/well nourished HEAD: Normocephalic/atraumatic EYES: EOMI/PERRL ENMT: Mucous membranes moist NECK: supple no meningeal signs. Thyroid is not enlarged.  SPINE/BACK:entire spine nontender CV: S1/S2 noted, no murmurs/rubs/gallops noted LUNGS: Lungs are clear to auscultation bilaterally, no apparent distress ABDOMEN: soft, nontender, no rebound or guarding, bowel sounds noted throughout abdomen GU:no cva tenderness NEURO: Pt is awake/alert/appropriate, moves all extremitiesx4.  No facial droop.     EXTREMITIES: pulses normal/equal, full ROM SKIN: warm, color normal PSYCH: no abnormalities of mood noted, alert and oriented to situation     Assessment & Plan:  Patient has been on Fosamax for many years now. There are reports of pathologic subtrochanteric fractures of the hip with patients on long-term Fosamax. I advised her to stop this. We'll check a bone density study. We'll continue calcium and vitamin D. Her Synthroid was refilled and a TSH free T4 were done today.I personally performed the services described in this documentation, which was scribed in my presence. The recorded information has been reviewed and is accurate.

## 2014-06-15 LAB — COMPLETE METABOLIC PANEL WITH GFR
ALBUMIN: 4.4 g/dL (ref 3.5–5.2)
ALT: 16 U/L (ref 0–35)
AST: 20 U/L (ref 0–37)
Alkaline Phosphatase: 54 U/L (ref 39–117)
BUN: 20 mg/dL (ref 6–23)
CO2: 24 mEq/L (ref 19–32)
Calcium: 9.4 mg/dL (ref 8.4–10.5)
Chloride: 108 mEq/L (ref 96–112)
Creat: 1 mg/dL (ref 0.50–1.10)
GFR, EST NON AFRICAN AMERICAN: 56 mL/min — AB
GFR, Est African American: 65 mL/min
GLUCOSE: 84 mg/dL (ref 70–99)
POTASSIUM: 4.2 meq/L (ref 3.5–5.3)
SODIUM: 141 meq/L (ref 135–145)
TOTAL PROTEIN: 6.8 g/dL (ref 6.0–8.3)
Total Bilirubin: 0.3 mg/dL (ref 0.2–1.2)

## 2014-06-15 LAB — T4, FREE: FREE T4: 1.09 ng/dL (ref 0.80–1.80)

## 2014-06-15 LAB — TSH: TSH: 2.179 u[IU]/mL (ref 0.350–4.500)

## 2014-06-16 LAB — VITAMIN D 25 HYDROXY (VIT D DEFICIENCY, FRACTURES): Vit D, 25-Hydroxy: 38 ng/mL (ref 30–100)

## 2014-07-01 ENCOUNTER — Other Ambulatory Visit: Payer: No Typology Code available for payment source

## 2014-07-01 ENCOUNTER — Other Ambulatory Visit: Payer: Self-pay | Admitting: *Deleted

## 2014-07-01 DIAGNOSIS — M810 Age-related osteoporosis without current pathological fracture: Secondary | ICD-10-CM

## 2014-07-27 ENCOUNTER — Ambulatory Visit
Admission: RE | Admit: 2014-07-27 | Discharge: 2014-07-27 | Disposition: A | Discharge status: Home / Self Care | Payer: No Typology Code available for payment source | Source: Ambulatory Visit | Attending: Emergency Medicine | Admitting: Emergency Medicine

## 2014-07-27 DIAGNOSIS — M810 Age-related osteoporosis without current pathological fracture: Secondary | ICD-10-CM

## 2014-07-27 DIAGNOSIS — Z13820 Encounter for screening for osteoporosis: Secondary | ICD-10-CM | POA: Diagnosis not present

## 2014-07-27 DIAGNOSIS — E559 Vitamin D deficiency, unspecified: Secondary | ICD-10-CM

## 2014-07-27 DIAGNOSIS — Z780 Asymptomatic menopausal state: Secondary | ICD-10-CM | POA: Diagnosis not present

## 2014-07-31 ENCOUNTER — Ambulatory Visit (INDEPENDENT_AMBULATORY_CARE_PROVIDER_SITE_OTHER): Payer: Medicare Other | Admitting: Internal Medicine

## 2014-07-31 VITALS — BP 108/84 | HR 66 | Temp 98.0°F | Resp 18 | Ht 65.5 in | Wt 191.8 lb

## 2014-07-31 DIAGNOSIS — H9202 Otalgia, left ear: Secondary | ICD-10-CM

## 2014-07-31 NOTE — Progress Notes (Signed)
   Subjective:    Patient ID: Nicole Brennan, female    DOB: [birth year], 74 y.o.   MRN: [id number]  HPI Has loss hearing left ear. Mild ear ache.  Review of Systems     Objective:   Physical Exam  Constitutional: She is oriented to person, place, and time. She appears well-developed and well-nourished.  HENT:  Head: Normocephalic.  Right Ear: Hearing, tympanic membrane, external ear and ear canal normal.  Left Ear: A foreign body is present. Decreased hearing is noted.  Mouth/Throat: Oropharynx is clear and moist.  Eyes: EOM are normal.  Neck: Normal range of motion.  Cardiovascular: Normal rate.   Pulmonary/Chest: Effort normal.  Neurological: She is alert and oriented to person, place, and time. She exhibits normal muscle tone. Coordination normal.  Psychiatric: She has a normal mood and affect.    Cerumen impaction Irrigated till clear.      Assessment & Plan:  Ear care

## 2014-07-31 NOTE — Patient Instructions (Signed)
Cerumen Impaction °A cerumen impaction is when the wax in your ear forms a plug. This plug usually causes reduced hearing. Sometimes it also causes an earache or dizziness. Removing a cerumen impaction can be difficult and painful. The wax sticks to the ear canal. The canal is sensitive and bleeds easily. If you try to remove a heavy wax buildup with a cotton tipped swab, you may push it in further. °Irrigation with water, suction, and small ear curettes may be used to clear out the wax. If the impaction is fixed to the skin in the ear canal, ear drops may be needed for a few days to loosen the wax. People who build up a lot of wax frequently can use ear wax removal products available in your local drugstore. °SEEK MEDICAL CARE IF:  °You develop an earache, increased hearing loss, or marked dizziness. °Document Released: 04/04/2004 Document Revised: 05/20/2011 Document Reviewed: 05/25/2009 °ExitCare® Patient Information ©2015 [employer]. This information is not intended to replace advice given to you by your health care provider. Make sure you discuss any questions you have with your health care provider. ° °

## 2014-08-25 ENCOUNTER — Encounter: Payer: Self-pay | Admitting: Emergency Medicine

## 2014-09-01 ENCOUNTER — Encounter: Payer: Self-pay | Admitting: Emergency Medicine

## 2014-09-01 ENCOUNTER — Ambulatory Visit (INDEPENDENT_AMBULATORY_CARE_PROVIDER_SITE_OTHER): Payer: Medicare Other | Admitting: Emergency Medicine

## 2014-09-01 VITALS — BP 127/75 | HR 65 | Temp 97.0°F | Resp 16 | Ht 65.5 in | Wt 189.4 lb

## 2014-09-01 DIAGNOSIS — H9202 Otalgia, left ear: Secondary | ICD-10-CM | POA: Diagnosis not present

## 2014-09-01 DIAGNOSIS — T162XXD Foreign body in left ear, subsequent encounter: Secondary | ICD-10-CM | POA: Diagnosis not present

## 2014-09-01 NOTE — Progress Notes (Signed)
Subjective:  This chart was scribed for Nicole Russian, MD by Tamsen Roers, at [facility].  This patient was seen in room 21 and the patient's care was started at 8:16 AM.    Patient ID: Nicole Brennan, female    DOB: [birth year], 74 y.o.   MRN: [id number] Chief Complaint  Patient presents with  . Cerumen Impaction    per patient x 1 month    HPI  HPI Comments: Nicole Brennan is a 74 y.o. female who presents to the [facility] complaining of wax build/pain in her ears bilaterally onset 1 month ago.  Patient states that her left ear is worse than her right and she only has mild change in hearing with a "cogged up sensation."  She was here at [facility] yesterday and saw Dr. Elder Cyphers with the same complaint but denies any relief.  She has not tried to irrigate her ears as she states that she is not able to do it on her own but has been using ear drops (states they did now work).  She has no other complaints today.    Patient Active Problem List   Diagnosis Date Noted  . Seasonal allergies 04/21/2012  . Cataract 04/21/2012  . Osteoporosis 05/01/2011  . Hypothyroidism 04/30/2011  . Osteoarthritis 04/30/2011   Past Medical History  Diagnosis Date  . Allergy   . Arthritis   . Cataract   . Thyroid disease    Past Surgical History  Procedure Laterality Date  . Knee surgery    . Tubal ligation    . Dg  bone density ([facility] hx)     Allergies  Allergen Reactions  . Penicillins Hives   Prior to Admission medications   Medication Sig Start Date End Date Taking? Authorizing Provider  benzonatate (TESSALON) 100 MG capsule Take 1-2 capsules (100-200 mg total) by mouth 3 (three) times daily as needed for cough. Patient not taking: Reported on 06/14/2014 06/24/13   Nicole Russian, MD  calcium carbonate 200 MG capsule Take 250 mg by mouth 2 (two) times daily with a meal.    Historical Provider, MD  Carboxymethylcellulose Sodium ([place] OP) Apply to eye.     Historical Provider, MD  Diphenhyd-Hydrocort-Nystatin (FIRST-DUKES MOUTHWASH) SUSP 1 teaspoon as a rinse and gargle and spit 4 times a day Patient not taking: Reported on 06/14/2014 06/24/13   Nicole Russian, MD  levothyroxine (SYNTHROID, LEVOTHROID) 75 MCG tablet Take 1 tablet daily 06/14/14   Nicole Russian, MD  Multiple Vitamins-Minerals (MULTIVITAMIN WITH MINERALS) tablet Take 1 tablet by mouth daily.    Historical Provider, MD   History   Social History  . Marital Status: Married    Spouse Name: N/A  . Number of Children: N/A  . Years of Education: N/A   Occupational History  . Not on file.   Social History Main Topics  . Smoking status: Never Smoker   . Smokeless tobacco: Never Used  . Alcohol Use: No  . Drug Use: No  . Sexual Activity: Not on file   Other Topics Concern  . Not on file   Social History Narrative     Current Outpatient Prescriptions on File Prior to Visit  Medication Sig Dispense Refill  . calcium carbonate 200 MG capsule Take 250 mg by mouth 2 (two) times daily with a meal.    . Carboxymethylcellulose Sodium (THERATEARS OP) Apply to eye.    . levothyroxine (SYNTHROID, LEVOTHROID) 75  MCG tablet Take 1 tablet daily 90 tablet 3  . Multiple Vitamins-Minerals (MULTIVITAMIN WITH MINERALS) tablet Take 1 tablet by mouth daily.     No current facility-administered medications on file prior to visit.    Allergies  Allergen Reactions  . Penicillins Hives    Review of Systems  HENT: Positive for ear pain.        Objective:   Physical Exam CONSTITUTIONAL: Well developed/well nourished HEAD: Normocephalic/atraumatic EYES: EOMI/PERRL ENMT: Mucous membranes moist there is wax present in both ears. The wax is nonobstructing. This was essentially removed with irrigation small residual anterior wax on the left ear. NECK: supple no meningeal signs SPINE/BACK:entire spine nontender CV: S1/S2 noted, no murmurs/rubs/gallops noted LUNGS: Lungs are clear to  auscultation bilaterally, no apparent distress ABDOMEN: soft, nontender, no rebound or guarding, bowel sounds noted throughout abdomen GU:no cva tenderness NEURO: Pt is awake/alert/appropriate, moves all extremitiesx4.  No facial droop.   EXTREMITIES: pulses normal/equal, full ROM SKIN: warm, color normal PSYCH: no abnormalities of mood noted, alert and oriented to situation  Filed Vitals:   09/01/14 0813  BP: 127/75  Pulse: 65  Temp: 97 F (36.1 C)  TempSrc: Oral  Resp: 16  Height: 5' 5.5" (1.664 m)  Weight: 189 lb 6.4 oz (85.911 kg)  SpO2: 97%       Assessment & Plan:  Wax removed from both external auditory canals. Patient tolerated this well. There is a minimal amount of residual wax on the left. No further treatment necessary.I personally performed the services described in this documentation, which was scribed in my presence. The recorded information has been reviewed and is accurate.  [name], MD

## 2014-09-21 ENCOUNTER — Telehealth: Payer: Self-pay | Admitting: Family Medicine

## 2014-09-21 NOTE — Telephone Encounter (Signed)
lmom to call and reschedule her appt with Daub on 10/18 is Cancell he will not be in the clinic that day

## 2014-10-12 DIAGNOSIS — D225 Melanocytic nevi of trunk: Secondary | ICD-10-CM | POA: Diagnosis not present

## 2014-10-12 DIAGNOSIS — L814 Other melanin hyperpigmentation: Secondary | ICD-10-CM | POA: Diagnosis not present

## 2014-10-12 DIAGNOSIS — L821 Other seborrheic keratosis: Secondary | ICD-10-CM | POA: Diagnosis not present

## 2014-10-27 ENCOUNTER — Encounter: Payer: Medicare Other | Admitting: Emergency Medicine

## 2014-11-08 ENCOUNTER — Encounter: Payer: Self-pay | Admitting: Emergency Medicine

## 2014-11-15 ENCOUNTER — Encounter: Payer: Self-pay | Admitting: Emergency Medicine

## 2014-11-15 ENCOUNTER — Telehealth: Payer: Self-pay | Admitting: *Deleted

## 2014-11-15 ENCOUNTER — Ambulatory Visit (INDEPENDENT_AMBULATORY_CARE_PROVIDER_SITE_OTHER): Payer: Medicare Other | Admitting: Emergency Medicine

## 2014-11-15 VITALS — BP 119/69 | HR 56 | Temp 98.4°F | Resp 16 | Ht 66.0 in | Wt 186.0 lb

## 2014-11-15 DIAGNOSIS — Z23 Encounter for immunization: Secondary | ICD-10-CM | POA: Diagnosis not present

## 2014-11-15 DIAGNOSIS — E038 Other specified hypothyroidism: Secondary | ICD-10-CM

## 2014-11-15 LAB — T4, FREE: Free T4: 1.1 ng/dL (ref 0.80–1.80)

## 2014-11-15 LAB — TSH: TSH: 3.77 u[IU]/mL (ref 0.350–4.500)

## 2014-11-15 NOTE — Progress Notes (Signed)
This chart was scribed for Nicole Queen, MD by Moises Blood, Medical Scribe. This patient was seen in Room 21 and the patient's care was started 11:07 AM.  Chief Complaint:  Chief Complaint  Patient presents with  . Hypothyroidism  . Nail Problem    right big toe    HPI: Nicole Brennan is a 74 y.o. female who reports to [facility] today for follow up for thyroid.  Nail Problem She dropped a coke can on her left big toe 4 years ago and the nail loosened. About 2-3 weeks ago, she jammed it, the nail fell off. She went to get it done by pedicure, and had paint over it to stay in place. After the pedicure, the nail still fell off. She went back and had an acrylic nail put in. Now, the toe has some redness around the nail.   Immunizations She was recommended for a flu shot today.    Past Medical History  Diagnosis Date  . Allergy   . Arthritis   . Cataract   . Thyroid disease    Past Surgical History  Procedure Laterality Date  . Knee surgery    . Tubal ligation    . Dg  bone density ([facility] hx)     Social History   Social History  . Marital Status: Married    Spouse Name: N/A  . Number of Children: N/A  . Years of Education: N/A   Social History Main Topics  . Smoking status: Never Smoker   . Smokeless tobacco: Never Used  . Alcohol Use: No  . Drug Use: No  . Sexual Activity: Not Asked   Other Topics Concern  . None   Social History Narrative   Family History  Problem Relation Age of Onset  . Cancer Mother   . Vascular Disease Mother   . Dementia Father    Allergies  Allergen Reactions  . Penicillins Hives   Prior to Admission medications   Medication Sig Start Date End Date Taking? Authorizing Provider  calcium carbonate 200 MG capsule Take 250 mg by mouth 2 (two) times daily with a meal.    Historical Provider, MD  Carboxymethylcellulose Sodium (THERATEARS OP) Apply to eye.    Historical Provider, MD  levothyroxine (SYNTHROID, LEVOTHROID) 75 MCG tablet  Take 1 tablet daily 06/14/14   [name], MD  Multiple Vitamins-Minerals (MULTIVITAMIN WITH MINERALS) tablet Take 1 tablet by mouth daily.    Historical Provider, MD     ROS: The patient denies fevers, chills, night sweats, unintentional weight loss, chest pain, palpitations, wheezing, dyspnea on exertion, nausea, vomiting, abdominal pain, dysuria, hematuria, melena, numbness, weakness, or tingling. Has redness on left big toe  All other systems have been reviewed and were otherwise negative with the exception of those mentioned in the HPI and as above.    PHYSICAL EXAM: Filed Vitals:   11/15/14 1057  BP: 119/69  Pulse: 56  Temp: 98.4 F (36.9 C)  Resp: 16   Body mass index is 30.04 kg/(m^2).   General: Alert, no acute distress HEENT:  Normocephalic, atraumatic, oropharynx patent; thyroid not enlarged Eye: EOMI, PEERLDC Cardiovascular: no rubs murmurs or gallops.  No Carotid bruits, radial pulse intact. No pedal edema; Irregular rate and rhythm Respiratory: Clear to auscultation bilaterally.  No wheezes, rales, or rhonchi.  No cyanosis, no use of accessory musculature Abdominal: No organomegaly, abdomen is soft and non-tender, positive bowel sounds.  No masses. Musculoskeletal: Gait intact. No edema, tenderness;  acrylic false nail on left great toe, redness around left nail plate area Skin: No rashes. Neurologic: Facial musculature symmetric. Psychiatric: Patient acts appropriately throughout our interaction.  Lymphatic: No cervical or submandibular lymphadenopathy Genitourinary/Anorectal: No acute findings   LABS: Results for orders placed or performed in visit on 06/14/14  Vit D  25 hydroxy (rtn osteoporosis monitoring)  Result Value Ref Range   Vit D, 25-Hydroxy 38 30 - 100 ng/mL  COMPLETE METABOLIC PANEL WITH GFR  Result Value Ref Range   Sodium 141 135 - 145 mEq/L   Potassium 4.2 3.5 - 5.3 mEq/L   Chloride 108 96 - 112 mEq/L   CO2 24 19 - 32 mEq/L   Glucose, Bld  84 70 - 99 mg/dL   BUN 20 6 - 23 mg/dL   Creat 1.00 0.50 - 1.10 mg/dL   Total Bilirubin 0.3 0.2 - 1.2 mg/dL   Alkaline Phosphatase 54 39 - 117 U/L   AST 20 0 - 37 U/L   ALT 16 0 - 35 U/L   Total Protein 6.8 6.0 - 8.3 g/dL   Albumin 4.4 3.5 - 5.2 g/dL   Calcium 9.4 8.4 - 10.5 mg/dL   GFR, Est African American 65 mL/min   GFR, Est Non African American 56 (L) mL/min  TSH  Result Value Ref Range   TSH 2.179 0.350 - 4.500 uIU/mL  T4, free  Result Value Ref Range   Free T4 1.09 0.80 - 1.80 ng/dL     EKG/XRAY:   Primary read interpreted by Dr. Everlene Farrier at [facility].   ASSESSMENT/PLAN: We were unable to get the wax removed from the left ear. We are trying to get her in to [facility] urine nose and throat. She will use De Brox drops until that time. TSH and T4 were done. She does have evidence of trauma to her toe no treatment necessary for this.   Gross sideeffects, risk and benefits, and alternatives of medications d/w patient. Patient is aware that all medications have potential sideeffects and we are unable to predict every sideeffect or drug-drug interaction that may occur.  Nicole Queen MD 11/15/2014 11:15 AM

## 2014-11-15 NOTE — Telephone Encounter (Signed)
Called and left message on machine that pt has appt at [facility] at [facility] on Friday 11/18/14.  Pt must be there at 840am and bring ins card and list of meds.  Also she is to pick up some Debrox over the counter to put in her left ear until she is seen by ENT.

## 2014-11-18 DIAGNOSIS — H6122 Impacted cerumen, left ear: Secondary | ICD-10-CM | POA: Diagnosis not present

## 2014-12-13 ENCOUNTER — Encounter: Payer: Self-pay | Admitting: Emergency Medicine

## 2014-12-27 ENCOUNTER — Ambulatory Visit: Payer: Self-pay | Admitting: Emergency Medicine

## 2015-03-01 ENCOUNTER — Ambulatory Visit (INDEPENDENT_AMBULATORY_CARE_PROVIDER_SITE_OTHER): Payer: Medicare Other

## 2015-03-01 ENCOUNTER — Ambulatory Visit (INDEPENDENT_AMBULATORY_CARE_PROVIDER_SITE_OTHER): Payer: Medicare Other | Admitting: Family Medicine

## 2015-03-01 ENCOUNTER — Encounter: Payer: Self-pay | Admitting: Family Medicine

## 2015-03-01 VITALS — BP 130/82 | HR 74 | Temp 98.2°F | Resp 16 | Ht 65.5 in | Wt 187.4 lb

## 2015-03-01 DIAGNOSIS — M25511 Pain in right shoulder: Secondary | ICD-10-CM

## 2015-03-01 DIAGNOSIS — R059 Cough, unspecified: Secondary | ICD-10-CM

## 2015-03-01 DIAGNOSIS — R05 Cough: Secondary | ICD-10-CM | POA: Diagnosis not present

## 2015-03-01 MED ORDER — PREDNISONE 20 MG PO TABS: ORAL_TABLET | ORAL | Status: DC

## 2015-03-01 NOTE — Patient Instructions (Addendum)
It was good to see you today I will let you know if the radiologist says anything of concern about your films Let me know if the pain in your right clavicle does not eventually go away!  You might ask your eye doctor about Xiidra drops for your dry eyes Try the prednisone for 5 days for your cough, chest congestion. If you are not feeling better or if you start to feel worse, have a fever, etc please let me know.

## 2015-03-01 NOTE — Progress Notes (Signed)
[facility] [address], [place] [address] [phone]  Date:  03/01/2015   Name:  Nicole Brennan   DOB:  05-28-1940   MRN:  [id number]  PCP:  Jenny Reichmann, MD    Chief Complaint: Cough; Sore Throat; and right bone hurts   History of Present Illness:  Nicole Brennan is a 74 y.o. very pleasant female patient who presents with the following:  Here today with illness-  She is an Engineer, civil (consulting) and sings a lot- she has noted a raspy ST off an on, and has an intermittent cough that can be severe.  Her sx have been present for 4-5 days She has not noted a fever. No chills she has noted a soreness in her right clavicle - his hurts her more at night over the lsst 4-5 months.  This is not getting better but is not getting worse.  Tylenol or ice on the area helps a little.     She is not aware of any injury in particular.    She is generally in good health She has not lost any weight, never been a smoker.   She takes just synthroid- no other medications.   Recent NORMAL bone density scan   Patient Active Problem List   Diagnosis Date Noted  . Seasonal allergies 04/21/2012  . Cataract 04/21/2012  . Osteoporosis 05/01/2011  . Hypothyroidism 04/30/2011  . Osteoarthritis 04/30/2011    Past Medical History  Diagnosis Date  . Allergy   . Arthritis   . Cataract   . Thyroid disease     Past Surgical History  Procedure Laterality Date  . Knee surgery    . Tubal ligation    . Dg  bone density ([facility] hx)      Social History  Substance Use Topics  . Smoking status: Never Smoker   . Smokeless tobacco: Never Used  . Alcohol Use: No    Family History  Problem Relation Age of Onset  . Cancer Mother   . Vascular Disease Mother   . Dementia Father     Allergies  Allergen Reactions  . Penicillins Hives    Medication list has been reviewed and updated.  Current Outpatient Prescriptions on File Prior to Visit  Medication Sig Dispense Refill  .  calcium carbonate 200 MG capsule Take 250 mg by mouth 2 (two) times daily with a meal.    . Carboxymethylcellulose Sodium (THERATEARS OP) Apply to eye.    . levothyroxine (SYNTHROID, LEVOTHROID) 75 MCG tablet Take 1 tablet daily 90 tablet 3  . Multiple Vitamins-Minerals (MULTIVITAMIN WITH MINERALS) tablet Take 1 tablet by mouth daily.     No current facility-administered medications on file prior to visit.    Review of Systems:  As per HPI- otherwise negative.   Physical Examination: Filed Vitals:   03/01/15 0927  BP: 130/82  Pulse: 74  Temp: 98.2 F (36.8 C)  Resp: 16   Filed Vitals:   03/01/15 0927  Height: 5' 5.5" (1.664 m)  Weight: 187 lb 6.4 oz (85.004 kg)   Body mass index is 30.7 kg/(m^2). Ideal Body Weight: Weight in (lb) to have BMI = 25: 152.2  GEN: WDWN, NAD, Non-toxic, A & O x 3, looks well.  Some cough and hoarse voice HEENT: Atraumatic, Normocephalic. Neck supple. No masses, No LAD.  Bilateral TM wnl, oropharynx normal.  PEERL,EOMI.   She notes mild tenderness around the right clavicle that seems associated with the muscles  and not the bone itself. No LAD noted  Ears and Nose: No external deformity. CV: RRR, No M/G/R. No JVD. No thrill. No extra heart sounds. PULM: CTA B, no wheezes, crackles, rhonchi. No retractions. No resp. distress. No accessory muscle use. ABD: S, NT, ND. No rebound. No HSM. EXTR: No c/c/e NEURO Normal gait.  PSYCH: Normally interactive. Conversant. Not depressed or anxious appearing.  Calm demeanor.   [facility] reading (PRIMARY) by  Dr. Lorelei Pont. Right clavicle: negative CXR: negative  CHEST 2 VIEW  COMPARISON: None in PACs  FINDINGS: The lungs are well-expanded. There is no focal infiltrate. There is no pleural effusion. The heart and pulmonary vascularity are normal. The mediastinum is normal in width. There is mild multilevel degenerative disc disease of the thoracic spine. There is old deformity of the posterior aspect of the  right eighth rib.  IMPRESSION: There is no active cardiopulmonary disease.  RIGHT CLAVICLE - 2+ VIEWS  COMPARISON: None.  FINDINGS: Frontal and tilt frontal images were obtained. No fracture or dislocation. There is osteoarthritic change in the acromioclavicular joint. No erosive change.  IMPRESSION: Osteoarthritic change in the acromioclavicular joint. No fracture or dislocation.  Assessment and Plan: Cough - Plan: DG Chest 2 View, predniSONE (DELTASONE) 20 MG tablet  Clavicle pain, right - Plan: DG Clavicle Right  Trial of prednisone for likely viral URI that is affecting her ability to sing and perform.  Hope this will help with her cough and congestion.  She did have osteoporosis in the past but this was treated and resolved X-rays are reassuring and do not show any explanation for the pain around the right clavicle.  She would ike to observe and see if this goes away  Signed Lamar Blinks, MD

## 2015-03-03 ENCOUNTER — Telehealth: Payer: Self-pay

## 2015-03-03 MED ORDER — HYDROCODONE-HOMATROPINE 5-1.5 MG/5ML PO SYRP: 2.5000 mL | ORAL_SOLUTION | Freq: Three times a day (TID) | ORAL | Status: DC | PRN

## 2015-03-03 NOTE — Telephone Encounter (Signed)
I wrote some hycodan for the patient - she will have to pick it up

## 2015-03-03 NOTE — Telephone Encounter (Signed)
LMOM for pt that Rx is ready for p/up, and warned about drowsiness.

## 2015-03-03 NOTE — Telephone Encounter (Signed)
Thank you Nicole Brennan

## 2015-03-03 NOTE — Telephone Encounter (Signed)
Pt would like this directed to Dr. Everlene Farrier. She was seen here on 12/21 by Dr. Lorelei Pont for Cough - Primary. She stated she doesn't feel terribly bad, but her cough is really bothering her. She would like something called into [facility] on [address]. CB  #  [phone]

## 2015-03-06 ENCOUNTER — Ambulatory Visit (INDEPENDENT_AMBULATORY_CARE_PROVIDER_SITE_OTHER): Payer: Medicare Other | Admitting: Family Medicine

## 2015-03-06 VITALS — BP 124/70 | HR 64 | Temp 97.4°F | Resp 18 | Ht 65.5 in | Wt 190.8 lb

## 2015-03-06 DIAGNOSIS — R059 Cough, unspecified: Secondary | ICD-10-CM

## 2015-03-06 DIAGNOSIS — J209 Acute bronchitis, unspecified: Secondary | ICD-10-CM | POA: Diagnosis not present

## 2015-03-06 DIAGNOSIS — R05 Cough: Secondary | ICD-10-CM | POA: Diagnosis not present

## 2015-03-06 DIAGNOSIS — J0110 Acute frontal sinusitis, unspecified: Secondary | ICD-10-CM

## 2015-03-06 MED ORDER — BENZONATATE 100 MG PO CAPS: 100.0000 mg | ORAL_CAPSULE | Freq: Three times a day (TID) | ORAL | Status: DC | PRN

## 2015-03-06 MED ORDER — DOXYCYCLINE HYCLATE 100 MG PO CAPS
100.0000 mg | ORAL_CAPSULE | Freq: Two times a day (BID) | ORAL | Status: DC
Start: 2015-03-06 — End: 2015-03-21

## 2015-03-06 NOTE — Progress Notes (Signed)
[facility] [address], [place] [address] [phone]  Date:  03/06/2015   Name:  Nicole Brennan   DOB:  [birth year]   MRN:  [id number]  PCP:  Jenny Reichmann, MD    Chief Complaint: Cough   History of Present Illness:  Nicole Brennan is a 74 y.o. very pleasant female patient who presents with the following:  Here today for a recheck- she was here 5 days ago with complaint of a raspy ST and intermittent cough.  At that time she had a negative CXR and benign exam- we used a short course of prednisone for her sx.    She notes that she still has a cough that is "just severe, and it gets so bad in the night." she has sinus congestion still Overall she feels "punky" but not like she has the flu or anything that serious  They are going out of town for a few days and her husband wanted her to have an eval first  No fever No GI symptoms She feels like her throat  She tried some hycodan syrup but this did not seem to help   Patient Active Problem List   Diagnosis Date Noted  . Seasonal allergies 04/21/2012  . Cataract 04/21/2012  . Osteoporosis 05/01/2011  . Hypothyroidism 04/30/2011  . Osteoarthritis 04/30/2011    Past Medical History  Diagnosis Date  . Allergy   . Arthritis   . Cataract   . Thyroid disease     Past Surgical History  Procedure Laterality Date  . Knee surgery    . Tubal ligation    . Dg  bone density ([facility] hx)      Social History  Substance Use Topics  . Smoking status: Never Smoker   . Smokeless tobacco: Never Used  . Alcohol Use: No    Family History  Problem Relation Age of Onset  . Cancer Mother   . Vascular Disease Mother   . Dementia Father     Allergies  Allergen Reactions  . Penicillins Hives    Medication list has been reviewed and updated.  Current Outpatient Prescriptions on File Prior to Visit  Medication Sig Dispense Refill  . calcium carbonate 200 MG capsule Take 250 mg by mouth 2 (two) times daily  with a meal.    . Carboxymethylcellulose Sodium (THERATEARS OP) Apply to eye.    Marland Kitchen HYDROcodone-homatropine (HYCODAN) 5-1.5 MG/5ML syrup Take 2.5-5 mLs by mouth every 8 (eight) hours as needed for cough. 70 mL 0  . levothyroxine (SYNTHROID, LEVOTHROID) 75 MCG tablet Take 1 tablet daily 90 tablet 3  . Multiple Vitamins-Minerals (MULTIVITAMIN WITH MINERALS) tablet Take 1 tablet by mouth daily.    . predniSONE (DELTASONE) 20 MG tablet Take 1 pill daily 5 tablet 0   No current facility-administered medications on file prior to visit.    Review of Systems:  As per HPI- otherwise negative.   Physical Examination: Filed Vitals:   03/06/15 0814  BP: 124/70  Pulse: 64  Temp: 97.4 F (36.3 C)  Resp: 18   Filed Vitals:   03/06/15 0814  Height: 5' 5.5" (1.664 m)  Weight: 190 lb 12.8 oz (86.546 kg)   Body mass index is 31.26 kg/(m^2). Ideal Body Weight: Weight in (lb) to have BMI = 25: 152.2  GEN: WDWN, NAD, Non-toxic, A & O x , looks well. Mild cough HEENT: Atraumatic, Normocephalic. Neck supple. No masses, No LAD.  Bilateral TM wnl, oropharynx  normal.  PEERL,EOMI.   Ears and Nose: No external deformity. CV: RRR, No M/G/R. No JVD. No thrill. No extra heart sounds. PULM: CTA B, no wheezes, crackles, rhonchi. No retractions. No resp. distress. No accessory muscle use. EXTR: No c/c/e NEURO Normal gait.  PSYCH: Normally interactive. Conversant. Not depressed or anxious appearing.  Calm demeanor.    Assessment and Plan: Acute bronchitis, unspecified organism - Plan: doxycycline (VIBRAMYCIN) 100 MG capsule  Acute frontal sinusitis, recurrence not specified  Cough - Plan: benzonatate (TESSALON) 100 MG capsule  Here today with illness for over a week.  They are going out of town for a few days and she would like an abx which is reasonable at this point.  Meds ordered this encounter  Medications  . doxycycline (VIBRAMYCIN) 100 MG capsule    Sig: Take 1 capsule (100 mg total) by mouth 2  (two) times daily.    Dispense:  20 capsule    Refill:  0  . benzonatate (TESSALON) 100 MG capsule    Sig: Take 1 capsule (100 mg total) by mouth 3 (three) times daily as needed for cough.    Dispense:  40 capsule    Refill:  0   She will let me know if not feeling better soon!   Signed Lamar Blinks, MD

## 2015-03-06 NOTE — Patient Instructions (Signed)
Have a great trip to [facility] head Use the doxycycline antibiotic as directed for your sinuses and chest Use the cough perles as needed-ok to combine with your cough syrup Let me know if you do not feel better soon!

## 2015-03-15 DIAGNOSIS — H2513 Age-related nuclear cataract, bilateral: Secondary | ICD-10-CM | POA: Diagnosis not present

## 2015-03-21 ENCOUNTER — Encounter: Payer: Self-pay | Admitting: Emergency Medicine

## 2015-03-21 ENCOUNTER — Telehealth: Payer: Self-pay | Admitting: *Deleted

## 2015-03-21 ENCOUNTER — Ambulatory Visit (INDEPENDENT_AMBULATORY_CARE_PROVIDER_SITE_OTHER): Payer: Medicare Other | Admitting: Emergency Medicine

## 2015-03-21 ENCOUNTER — Ambulatory Visit: Payer: Medicare Other | Admitting: Emergency Medicine

## 2015-03-21 VITALS — BP 119/68 | HR 80 | Temp 97.9°F | Resp 16 | Wt 188.8 lb

## 2015-03-21 DIAGNOSIS — E038 Other specified hypothyroidism: Secondary | ICD-10-CM

## 2015-03-21 DIAGNOSIS — R059 Cough, unspecified: Secondary | ICD-10-CM

## 2015-03-21 DIAGNOSIS — R05 Cough: Secondary | ICD-10-CM

## 2015-03-21 DIAGNOSIS — J209 Acute bronchitis, unspecified: Secondary | ICD-10-CM | POA: Diagnosis not present

## 2015-03-21 MED ORDER — HYDROCODONE-HOMATROPINE 5-1.5 MG/5ML PO SYRP: 2.5000 mL | ORAL_SOLUTION | Freq: Three times a day (TID) | ORAL | Status: DC | PRN

## 2015-03-21 MED ORDER — ALBUTEROL SULFATE (2.5 MG/3ML) 0.083% IN NEBU
2.5000 mg | INHALATION_SOLUTION | Freq: Once | RESPIRATORY_TRACT | Status: AC
  Administered 2015-03-21: 2.5 mg via RESPIRATORY_TRACT

## 2015-03-21 MED ORDER — LEVOTHYROXINE SODIUM 75 MCG PO TABS: ORAL_TABLET | ORAL | Status: DC

## 2015-03-21 NOTE — Telephone Encounter (Signed)
Faxed prescription to [facility], per Dr Everlene Farrier.

## 2015-03-21 NOTE — Progress Notes (Signed)
By signing my name below, I, Nicole Brennan, attest that this documentation has been prepared under the direction and in the presence of [name], MD. Electronically Signed: [name], ER Scribe. 03/21/2015. 12:32 PM.  Chief Complaint:  Chief Complaint  Patient presents with  . follow up thyroid  . Cough    6 weeks   HPI: Nicole Brennan is a 75 y.o. female who reports to [facility] today complaining of continued cough for the last four weeks. Her sx started with cough and fatigue as well as a slight sore throat. Her fatigue and sore throat have been resolved for some weeks, but her cough has persisted. She has taken tessalon pearls without relief. She saw Dr. Edilia Bo for the same sx on 03/01/2015 and 03/06/2015. She had a normal CXR on the 21st, and was prescribed prednisone at that visit. She returned on the 26th without any improvement in her sx. She stated at that visit her cough was getting bad at night and she noted she had sinus congestion at that visit. She was prescribed 10 days of doxycycline and tessalon pearls at that visit. She has not used an inhaler in the past.   Past Medical History  Diagnosis Date  . Allergy   . Arthritis   . Cataract   . Thyroid disease    Past Surgical History  Procedure Laterality Date  . Knee surgery    . Tubal ligation    . Dg  bone density ([facility] hx)     Social History   Social History  . Marital Status: Married    Spouse Name: N/A  . Number of Children: N/A  . Years of Education: N/A   Social History Main Topics  . Smoking status: Never Smoker   . Smokeless tobacco: Never Used  . Alcohol Use: No  . Drug Use: No  . Sexual Activity: Not Asked   Other Topics Concern  . None   Social History Narrative   Family History  Problem Relation Age of Onset  . Cancer Mother   . Vascular Disease Mother   . Dementia Father    Allergies  Allergen Reactions  . Penicillins Hives   Prior to Admission medications   Medication Sig  Start Date End Date Taking? Authorizing Provider  calcium carbonate 200 MG capsule Take 250 mg by mouth 2 (two) times daily with a meal.    Historical Provider, MD  Carboxymethylcellulose Sodium (THERATEARS OP) Apply to eye.    Historical Provider, MD  levothyroxine (SYNTHROID, LEVOTHROID) 75 MCG tablet Take 1 tablet daily 06/14/14   [name], MD  Multiple Vitamins-Minerals (MULTIVITAMIN WITH MINERALS) tablet Take 1 tablet by mouth daily.    Historical Provider, MD     ROS: The patient denies fevers, chills, night sweats, unintentional weight loss, chest pain, palpitations, wheezing, dyspnea on exertion, nausea, vomiting, abdominal pain, dysuria, hematuria, melena, numbness, weakness, or tingling.   All other systems have been reviewed and were otherwise negative with the exception of those mentioned in the HPI and as above.    PHYSICAL EXAM: Filed Vitals:   03/21/15 1212  BP: 119/68  Pulse: 80  Temp: 97.9 F (36.6 C)  Resp: 16   Body mass index is 30.93 kg/(m^2).   General: Alert, no acute distress HEENT:  Normocephalic, atraumatic, oropharynx patent. Eye: Juliette Mangle [facility] Cardiovascular:  Regular rate and rhythm, no rubs murmurs or gallops.  No Carotid bruits, radial pulse intact. No pedal edema.  Respiratory: Clear  to auscultation bilaterally.  No wheezes, rales, or rhonchi.  No cyanosis, no use of accessory musculature Abdominal: No organomegaly, abdomen is soft and non-tender, positive bowel sounds.  No masses. Musculoskeletal: Gait intact. No edema, tenderness Skin: No rashes. Neurologic: Facial musculature symmetric. Psychiatric: Patient acts appropriately throughout our interaction. Lymphatic: No cervical or submandibular lymphadenopathy    LABS:    EKG/XRAY:   Primary read interpreted by Dr. Everlene Farrier at [facility].   ASSESSMENT/PLAN: The albuterol treatment did not seem to help. This seems to be an upper airways cough. I did refill her Hycodan to take at night. Hopefully  this will give her some relief. When she took the prednisone it also did not help.I personally performed the services described in this documentation, which was scribed in my presence. The recorded information has been reviewed and is accurate.   Gross sideeffects, risk and benefits, and alternatives of medications d/w patient. Patient is aware that all medications have potential sideeffects and we are unable to predict every sideeffect or drug-drug interaction that may occur.  Arlyss Queen MD 03/21/2015 12:32 PM

## 2015-03-27 ENCOUNTER — Other Ambulatory Visit: Payer: Self-pay

## 2015-03-27 DIAGNOSIS — Z1231 Encounter for screening mammogram for malignant neoplasm of breast: Secondary | ICD-10-CM

## 2015-04-05 ENCOUNTER — Other Ambulatory Visit: Payer: Self-pay | Admitting: Emergency Medicine

## 2015-05-17 ENCOUNTER — Ambulatory Visit
Admission: RE | Admit: 2015-05-17 | Discharge: 2015-05-17 | Disposition: A | Discharge status: Home / Self Care | Payer: Medicare Other | Source: Ambulatory Visit

## 2015-05-17 ENCOUNTER — Ambulatory Visit: Payer: No Typology Code available for payment source

## 2015-05-17 DIAGNOSIS — Z1231 Encounter for screening mammogram for malignant neoplasm of breast: Secondary | ICD-10-CM | POA: Diagnosis not present

## 2015-05-19 ENCOUNTER — Other Ambulatory Visit: Payer: Self-pay | Admitting: Emergency Medicine

## 2015-05-19 DIAGNOSIS — R928 Other abnormal and inconclusive findings on diagnostic imaging of breast: Secondary | ICD-10-CM

## 2015-05-22 ENCOUNTER — Telehealth: Payer: Self-pay

## 2015-05-22 NOTE — Telephone Encounter (Signed)
I called and spoke with patient. She will contact the x-ray facility and get scheduled for her follow-up films of the right breast.

## 2015-05-22 NOTE — Telephone Encounter (Signed)
Pt states Nicole Brennan called her this weekend regarding her x-ray. Please call [phone]

## 2015-05-22 NOTE — Telephone Encounter (Signed)
The order has been faxed back to [place].  Contacting patient now to inform her that it was sent.

## 2015-05-22 NOTE — Telephone Encounter (Signed)
Pt called to say she [facility] at 9:20 in the morning but need the referral sent over asap Would like a call back to [phone] when done. Pt is really anxious

## 2015-05-23 ENCOUNTER — Ambulatory Visit
Admission: RE | Admit: 2015-05-23 | Discharge: 2015-05-23 | Disposition: A | Discharge status: Home / Self Care | Payer: Medicare Other | Source: Ambulatory Visit | Attending: Emergency Medicine | Admitting: Emergency Medicine

## 2015-05-23 DIAGNOSIS — R928 Other abnormal and inconclusive findings on diagnostic imaging of breast: Secondary | ICD-10-CM

## 2015-05-23 DIAGNOSIS — N641 Fat necrosis of breast: Secondary | ICD-10-CM | POA: Diagnosis not present

## 2015-07-14 ENCOUNTER — Other Ambulatory Visit: Payer: Self-pay | Admitting: Emergency Medicine

## 2015-07-14 DIAGNOSIS — N63 Unspecified lump in unspecified breast: Secondary | ICD-10-CM

## 2015-08-11 ENCOUNTER — Ambulatory Visit (INDEPENDENT_AMBULATORY_CARE_PROVIDER_SITE_OTHER): Payer: Medicare Other | Admitting: Emergency Medicine

## 2015-08-11 VITALS — BP 124/78 | HR 68 | Temp 98.5°F | Resp 17 | Ht 65.5 in | Wt 187.2 lb

## 2015-08-11 DIAGNOSIS — R059 Cough, unspecified: Secondary | ICD-10-CM

## 2015-08-11 DIAGNOSIS — R490 Dysphonia: Secondary | ICD-10-CM | POA: Diagnosis not present

## 2015-08-11 DIAGNOSIS — J029 Acute pharyngitis, unspecified: Secondary | ICD-10-CM

## 2015-08-11 DIAGNOSIS — R05 Cough: Secondary | ICD-10-CM

## 2015-08-11 LAB — POCT CBC
Granulocyte percent: 72 %G (ref 37–80)
HCT, POC: 35.5 % — AB (ref 37.7–47.9)
HEMOGLOBIN: 12.5 g/dL (ref 12.2–16.2)
LYMPH, POC: 1.9 (ref 0.6–3.4)
MCH: 32.6 pg — AB (ref 27–31.2)
MCHC: 35.1 g/dL (ref 31.8–35.4)
MCV: 92.8 fL (ref 80–97)
MID (cbc): 0.7 (ref 0–0.9)
MPV: 7.4 fL (ref 0–99.8)
PLATELET COUNT, POC: 267 10*3/uL (ref 142–424)
POC Granulocyte: 6.6 (ref 2–6.9)
POC LYMPH PERCENT: 20.7 %L (ref 10–50)
POC MID %: 7.3 % (ref 0–12)
RBC: 3.83 M/uL — AB (ref 4.04–5.48)
RDW, POC: 13.7 %
WBC: 9.2 10*3/uL (ref 4.6–10.2)

## 2015-08-11 LAB — POCT RAPID STREP A (OFFICE): Rapid Strep A Screen: NEGATIVE

## 2015-08-11 MED ORDER — BENZONATATE 100 MG PO CAPS: 100.0000 mg | ORAL_CAPSULE | Freq: Three times a day (TID) | ORAL | Status: DC | PRN

## 2015-08-11 MED ORDER — HYDROCODONE-HOMATROPINE 5-1.5 MG/5ML PO SYRP: 2.5000 mL | ORAL_SOLUTION | Freq: Three times a day (TID) | ORAL | Status: DC | PRN

## 2015-08-11 NOTE — Patient Instructions (Addendum)
     IF you received an x-ray today, you will receive an invoice from [facility]. Please contact [facility] at [phone] with questions or concerns regarding your invoice.   IF you received labwork today, you will receive an invoice from [facility]. Please contact [facility] at [phone] with questions or concerns regarding your invoice.   Our billing staff will not be able to assist you with questions regarding bills from these companies.  You will be contacted with the lab results as soon as they are available. The fastest way to get your results is to activate your My Chart account. Instructions are located on the last page of this paperwork. If you have not heard from us regarding the results in 2 weeks, please contact this office.      Cough, Adult Coughing is a reflex that clears your throat and your airways. Coughing helps to heal and protect your lungs. It is normal to cough occasionally, but a cough that happens with other symptoms or lasts a long time may be a sign of a condition that needs treatment. A cough may last only 2-3 weeks (acute), or it may last longer than 8 weeks (chronic). CAUSES Coughing is commonly caused by:  Breathing in substances that irritate your lungs.  A viral or bacterial respiratory infection.  Allergies.  Asthma.  Postnasal drip.  Smoking.  Acid backing up from the stomach into the esophagus (gastroesophageal reflux).  Certain medicines.  Chronic lung problems, including COPD (or rarely, lung cancer).  Other medical conditions such as heart failure. HOME CARE INSTRUCTIONS  Pay attention to any changes in your symptoms. Take these actions to help with your discomfort:  Take medicines only as told by your health care provider.  If you were prescribed an antibiotic medicine, take it as told by your health care provider. Do not stop taking the antibiotic even if you start to feel  better.  Talk with your health care provider before you take a cough suppressant medicine.  Drink enough fluid to keep your urine clear or pale yellow.  If the air is dry, use a cold steam vaporizer or humidifier in your bedroom or your home to help loosen secretions.  Avoid anything that causes you to cough at work or at home.  If your cough is worse at night, try sleeping in a semi-upright position.  Avoid cigarette smoke. If you smoke, quit smoking. If you need help quitting, ask your health care provider.  Avoid caffeine.  Avoid alcohol.  Rest as needed. SEEK MEDICAL CARE IF:   You have new symptoms.  You cough up pus.  Your cough does not get better after 2-3 weeks, or your cough gets worse.  You cannot control your cough with suppressant medicines and you are losing sleep.  You develop pain that is getting worse or pain that is not controlled with pain medicines.  You have a fever.  You have unexplained weight loss.  You have night sweats. SEEK IMMEDIATE MEDICAL CARE IF:  You cough up blood.  You have difficulty breathing.  Your heartbeat is very fast.   This information is not intended to replace advice given to you by your health care provider. Make sure you discuss any questions you have with your health care provider.   Document Released: 08/24/2010 Document Revised: 11/16/2014 Document Reviewed: 05/04/2014 Elsevier Interactive Patient Education 2016 [employer]vier Inc.  

## 2015-08-11 NOTE — Progress Notes (Addendum)
Patient ID: Nicole Brennan, female   DOB: 12-Oct-1940, 75 y.o.   MRN: [id number]    By signing my name below, I, Nicole Brennan, attest that this documentation has been prepared under the direction and in the presence of Nicole Russian, MD Electronically Signed: Ladene Artist, ED Scribe 08/11/2015 at 9:17 AM  Chief Complaint:  Chief Complaint  Patient presents with  . Nasal Congestion    hoarseness, pressure behind the eyes, cough  x 2-3 days ago    HPI: Nicole Brennan is a 75 y.o. female who reports to [facility] today complaining of gradually worsening sore throat onset 2 days ago. 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 She reports associated symptoms of fatigue, hoarseness, minimally  clear rhinorrhea, nasal congestion, dry cough, pressure behind both eyes. No treatments tried PTA. She denies fever.  Past Medical History  Diagnosis Date  . Allergy   . Arthritis   . Cataract   . Thyroid disease    Past Surgical History  Procedure Laterality Date  . Knee surgery    . Tubal ligation    . Dg  bone density ([facility] hx)     Social History   Social History  . Marital Status: Married    Spouse Name: N/A  . Number of Children: N/A  . Years of Education: N/A   Social History Main Topics  . Smoking status: Never Smoker   . Smokeless tobacco: Never Used  . Alcohol Use: No  . Drug Use: No  . Sexual Activity: Not Asked   Other Topics Concern  . None   Social History Narrative   Family History  Problem Relation Age of Onset  . Cancer Mother   . Vascular Disease Mother   . Dementia Father    Allergies  Allergen Reactions  . Penicillins Hives   Prior to Admission medications   Medication Sig Start Date End Date Taking? Authorizing Provider  calcium carbonate 200 MG capsule Take 250 mg by mouth 2 (two) times daily with a meal.   Yes Historical Provider, MD  Carboxymethylcellulose Sodium (THERATEARS OP) Apply to eye.   Yes Historical Provider, MD    levothyroxine (SYNTHROID, LEVOTHROID) 75 MCG tablet Take 1 tablet daily 03/21/15  Yes Nicole Russian, MD  Multiple Vitamins-Minerals (MULTIVITAMIN WITH MINERALS) tablet Take 1 tablet by mouth daily.   Yes Historical Provider, MD   ROS: The patient denies fevers, chills, night sweats, unintentional weight loss, chest pain, palpitations, wheezing, dyspnea on exertion, nausea, vomiting, abdominal pain, dysuria, hematuria, melena, numbness, weakness, or tingling.  All other systems have been reviewed and were otherwise negative with the exception of those mentioned in the HPI and as above.    PHYSICAL EXAM: Filed Vitals:   08/11/15 0825  BP: 124/78  Pulse: 68  Temp: 98.5 F (36.9 C)  Resp: 17   Body mass index is 30.67 kg/(m^2).  General: Alert, no acute distress HEENT:  Normocephalic, atraumatic, oropharynx patent. Throat is red. Nose is normal. Raspy voice.   Eye: Nicole Brennan [facility] Cardiovascular: Regular rate and rhythm, no rubs murmurs or gallops. No Carotid bruits, radial pulse intact. No pedal edema.  Respiratory: Clear to auscultation bilaterally. No wheezes, rales, or rhonchi. No cyanosis, no use of accessory musculature Abdominal: No organomegaly, abdomen is soft and non-tender, positive bowel sounds. No masses. Musculoskeletal: Gait intact. No edema, tenderness Skin: No rashes. Neurologic: Facial musculature symmetric. Psychiatric: Patient acts appropriately throughout our interaction. Lymphatic: No cervical or submandibular  lymphadenopathy  LABS: Results for orders placed or performed in visit on 08/11/15  POCT CBC  Result Value Ref Range   WBC 9.2 4.6 - 10.2 K/uL   Lymph, poc 1.9 0.6 - 3.4   POC LYMPH PERCENT 20.7 10 - 50 %L   MID (cbc) 0.7 0 - 0.9   POC MID % 7.3 0 - 12 %M   POC Granulocyte 6.6 2 - 6.9   Granulocyte percent 72.0 37 - 80 %G   RBC 3.83 (A) 4.04 - 5.48 M/uL   Hemoglobin 12.5 12.2 - 16.2 g/dL   HCT, POC 35.5 (A) 37.7 - 47.9 %   MCV 92.8 80 - 97 fL   MCH,  POC 32.6 (A) 27 - 31.2 pg   MCHC 35.1 31.8 - 35.4 g/dL   RDW, POC 13.7 %   Platelet Count, POC 267 142 - 424 K/uL   MPV 7.4 0 - 99.8 fL  POCT rapid strep A  Result Value Ref Range   Rapid Strep A Screen Negative Negative   EKG/XRAY:   Primary read interpreted by Dr. Everlene Farrier at [facility].  ASSESSMENT/PLAN: Strep screen is negative cough medication will be filled. Her white cell count is normal. Her lung exam is normal. She does have significant hoarseness. 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 The recorded information has been reviewed and is accurate.Patient will return to clinic in September for a TSH.    Gross sideeffects, risk and benefits, and alternatives of medications d/w patient. Patient is aware that all medications have potential sideeffects and we are unable to predict every sideeffect or drug-drug interaction that may occur.  Nicole Queen MD 08/11/2015 8:49 AM

## 2015-08-12 LAB — CULTURE, GROUP A STREP: ORGANISM ID, BACTERIA: NORMAL

## 2015-08-13 DIAGNOSIS — J0100 Acute maxillary sinusitis, unspecified: Secondary | ICD-10-CM | POA: Diagnosis not present

## 2015-08-13 DIAGNOSIS — J40 Bronchitis, not specified as acute or chronic: Secondary | ICD-10-CM | POA: Diagnosis not present

## 2015-08-23 ENCOUNTER — Ambulatory Visit
Admission: RE | Admit: 2015-08-23 | Discharge: 2015-08-23 | Disposition: A | Discharge status: Home / Self Care | Payer: Medicare Other | Source: Ambulatory Visit | Attending: Emergency Medicine | Admitting: Emergency Medicine

## 2015-08-23 DIAGNOSIS — N63 Unspecified lump in unspecified breast: Secondary | ICD-10-CM

## 2015-08-23 DIAGNOSIS — N641 Fat necrosis of breast: Secondary | ICD-10-CM | POA: Diagnosis not present

## 2015-08-25 ENCOUNTER — Other Ambulatory Visit: Payer: Medicare Other

## 2015-08-25 DIAGNOSIS — R05 Cough: Secondary | ICD-10-CM | POA: Diagnosis not present

## 2015-08-25 DIAGNOSIS — R490 Dysphonia: Secondary | ICD-10-CM | POA: Diagnosis not present

## 2015-08-25 DIAGNOSIS — J343 Hypertrophy of nasal turbinates: Secondary | ICD-10-CM | POA: Diagnosis not present

## 2015-09-04 ENCOUNTER — Ambulatory Visit (INDEPENDENT_AMBULATORY_CARE_PROVIDER_SITE_OTHER): Payer: Medicare Other | Admitting: Emergency Medicine

## 2015-09-04 VITALS — BP 124/74 | HR 74 | Temp 97.4°F | Resp 17 | Ht 65.5 in | Wt 180.0 lb

## 2015-09-04 DIAGNOSIS — R490 Dysphonia: Secondary | ICD-10-CM | POA: Diagnosis not present

## 2015-09-04 DIAGNOSIS — R059 Cough, unspecified: Secondary | ICD-10-CM

## 2015-09-04 DIAGNOSIS — J029 Acute pharyngitis, unspecified: Secondary | ICD-10-CM

## 2015-09-04 DIAGNOSIS — R05 Cough: Secondary | ICD-10-CM | POA: Diagnosis not present

## 2015-09-04 MED ORDER — MONTELUKAST SODIUM 10 MG PO TABS: 10.0000 mg | ORAL_TABLET | Freq: Every day | ORAL | Status: DC

## 2015-09-04 MED ORDER — RANITIDINE HCL 150 MG PO TABS: 150.0000 mg | ORAL_TABLET | Freq: Two times a day (BID) | ORAL | Status: DC

## 2015-09-04 NOTE — Progress Notes (Signed)
Subjective:  This chart was scribed for Arlyss Queen MD, by Tamsen Roers, at [facility].  This patient was seen in room 3  and the patient's care was started at 11:02 AM.   Chief Complaint  Patient presents with  . Cough  . leg cramps     Patient ID: Nicole Brennan, female    DOB: 12-18-1940, 75 y.o.   MRN: [id number]  HPI HPI Comments: Nicole Brennan is a 75 y.o. female who presents to the [facility] complaining of a "tickling and aggravating" cough onset earlier this month.  Patient is resting her voice and has been using saline nasal spray as well as mucin ex.  She has never had allergies or used allergy medication in the past. 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  She went to ENT and was told that there was no issues with her vocal chords or anything significant to be concerned of. She has been using cough syrup and drops but states that nothing has helped alleviate her cough. She denies any wheezing.     She is also complaining of waking up intermittently with cramps in her arms and legs in the middle of the night. She has noticed that it occurs more often after her long trips.  She does not wear compression tights when she travels.    Chest x ray 03/01/15 was normal.  Patient Active Problem List   Diagnosis Date Noted  . Seasonal allergies 04/21/2012  . Cataract 04/21/2012  . Osteoporosis 05/01/2011  . Hypothyroidism 04/30/2011  . Osteoarthritis 04/30/2011   Past Medical History  Diagnosis Date  . Allergy   . Arthritis   . Cataract   . Thyroid disease    Past Surgical History  Procedure Laterality Date  . Knee surgery    . Tubal ligation    . Dg  bone density ([facility] hx)     Allergies  Allergen Reactions  . Penicillins Hives   Prior to Admission medications   Medication Sig Start Date End Date Taking? Authorizing Provider  calcium carbonate 200 MG  capsule Take 250 mg by mouth 2 (two) times daily with a meal.   Yes Historical Provider, MD  Carboxymethylcellulose Sodium (THERATEARS OP) Apply to eye.   Yes Historical Provider, MD  levothyroxine (SYNTHROID, LEVOTHROID) 75 MCG tablet Take 1 tablet daily 03/21/15  Yes [name], MD  Multiple Vitamins-Minerals (MULTIVITAMIN WITH MINERALS) tablet Take 1 tablet by mouth daily.   Yes Historical Provider, MD  benzonatate (TESSALON) 100 MG capsule Take 1-2 capsules (100-200 mg total) by mouth 3 (three) times daily as needed for cough. Patient not taking: Reported on 09/04/2015 08/11/15   [name], MD  HYDROcodone-homatropine [facility]) 5-1.5 MG/5ML syrup Take 2.5-5 mLs by mouth every 8 (eight) hours as needed for cough. Patient not taking: Reported on 09/04/2015 08/11/15   [name], MD   Social History   Social History  . Marital Status: Married    Spouse Name: N/A  . Number of Children: N/A  . Years of Education: N/A   Occupational History  . Not on file.   Social History Main Topics  . Smoking status: Never Smoker   . Smokeless tobacco: Never Used  . Alcohol Use: No  . Drug Use: No  . Sexual Activity: Not on file   Other Topics Concern  . Not on file  Social History Narrative       Review of Systems  Constitutional: Negative for fever and chills.  Eyes: Negative for pain, redness and itching.  Respiratory: Positive for cough. Negative for wheezing.   Gastrointestinal: Negative for nausea and vomiting.  Musculoskeletal: Negative for neck pain and neck stiffness.  Neurological: Negative for seizures, syncope and speech difficulty.       Objective:   Physical Exam Filed Vitals:   09/04/15 1041  BP: 124/74  Pulse: 74  Temp: 97.4 F (36.3 C)  TempSrc: Oral  Resp: 17  Height: 5' 5.5" (1.664 m)  Weight: 180 lb (81.647 kg)  SpO2: 98%   CONSTITUTIONAL: Well developed/well nourished HEAD: Normocephalic/atraumatic EYES: EOMI/PERRL ENMT: Mucous membranes  moist NECK: supple no meningeal signs SPINE/BACK:entire spine nontender CV: S1/S2 noted, no murmurs/rubs/gallops noted LUNGS: Lungs are clear to auscultation bilaterally, no apparent distress, frequent episodes of coughing.  NEURO: Pt is awake/alert/appropriate, moves all extremitiesx4.  No facial droop.   EXTREMITIES: pulses normal/equal, full ROM SKIN: warm, color normal PSYCH: no abnormalities of mood noted, alert and oriented to situation  Office Spirometry Results: FEV1: 2.39 liters FVC: 3.01 liters FEV1/FVC: 79.4 % FVC  % Predicted: 100 liters FEV % Predicted: 105 liters FeF 25-75: 2.25 liters FeF 25-75 % Predicted: 124     Assessment & Plan:  Pulmonary function test is completely normal. I suspect her symptoms are allergy related. She will start on Singulair one at night. She does need to continue to rest her voice. I also advised her to take 150 of Zantac twice a day.I personally performed the services she will also take a magnesium tablet 200 or 250 mg daily. described in this documentation, which was scribed in my presence. The recorded information has been reviewed and is accurate.

## 2015-09-04 NOTE — Patient Instructions (Addendum)
Please rest your  voice. Please gargle with salt water 3-4 times a day. Take Singulair one at night. Take Zantac one twice a day. Call if no better in 2 weeks and we will make a referral to the pulmonary specialist.    IF you received an x-ray today, you will receive an invoice from [facility]. Please contact [facility] at [phone] with questions or concerns regarding your invoice.   IF you received labwork today, you will receive an invoice from [employer]. Please contact [facility] at [phone] with questions or concerns regarding your invoice.   Our billing staff will not be able to assist you with questions regarding bills from these companies.  You will be contacted with the lab results as soon as they are available. The fastest way to get your results is to activate your My Chart account. Instructions are located on the last page of this paperwork. If you have not heard from [place] regarding the results in 2 weeks, please contact this office.

## 2016-01-22 ENCOUNTER — Encounter: Payer: Self-pay | Admitting: Family Medicine

## 2016-01-22 ENCOUNTER — Ambulatory Visit (INDEPENDENT_AMBULATORY_CARE_PROVIDER_SITE_OTHER): Payer: Medicare Other | Admitting: Family Medicine

## 2016-01-22 VITALS — BP 106/64 | HR 83 | Temp 97.8°F | Ht 65.5 in | Wt 186.2 lb

## 2016-01-22 DIAGNOSIS — Z131 Encounter for screening for diabetes mellitus: Secondary | ICD-10-CM | POA: Diagnosis not present

## 2016-01-22 DIAGNOSIS — E038 Other specified hypothyroidism: Secondary | ICD-10-CM

## 2016-01-22 DIAGNOSIS — M7501 Adhesive capsulitis of right shoulder: Secondary | ICD-10-CM | POA: Diagnosis not present

## 2016-01-22 DIAGNOSIS — Z23 Encounter for immunization: Secondary | ICD-10-CM

## 2016-01-22 DIAGNOSIS — Z130 Encounter for screening for diseases of the blood and blood-forming organs and certain disorders involving the immune mechanism: Secondary | ICD-10-CM

## 2016-01-22 DIAGNOSIS — E785 Hyperlipidemia, unspecified: Secondary | ICD-10-CM | POA: Diagnosis not present

## 2016-01-22 DIAGNOSIS — E034 Atrophy of thyroid (acquired): Secondary | ICD-10-CM | POA: Diagnosis not present

## 2016-01-22 LAB — CBC
HCT: 38.5 % (ref 36.0–46.0)
Hemoglobin: 13 g/dL (ref 12.0–15.0)
MCHC: 33.7 g/dL (ref 30.0–36.0)
MCV: 95.1 fl (ref 78.0–100.0)
PLATELETS: 328 10*3/uL (ref 150.0–400.0)
RBC: 4.05 Mil/uL (ref 3.87–5.11)
RDW: 13.4 % (ref 11.5–15.5)
WBC: 5.6 10*3/uL (ref 4.0–10.5)

## 2016-01-22 LAB — LIPID PANEL
CHOLESTEROL: 191 mg/dL (ref 0–200)
HDL: 60.3 mg/dL (ref >39.00)
LDL CALC: 111 mg/dL — AB (ref 0–99)
NonHDL: 130.35
TRIGLYCERIDES: 99 mg/dL (ref 0.0–149.0)
Total CHOL/HDL Ratio: 3
VLDL: 19.8 mg/dL (ref 0.0–40.0)

## 2016-01-22 LAB — COMPREHENSIVE METABOLIC PANEL
ALBUMIN: 4.3 g/dL (ref 3.5–5.2)
ALK PHOS: 56 U/L (ref 39–117)
ALT: 24 U/L (ref 0–35)
AST: 24 U/L (ref 0–37)
BILIRUBIN TOTAL: 0.6 mg/dL (ref 0.2–1.2)
BUN: 18 mg/dL (ref 6–23)
CALCIUM: 10 mg/dL (ref 8.4–10.5)
CHLORIDE: 106 meq/L (ref 96–112)
CO2: 30 mEq/L (ref 19–32)
CREATININE: 0.95 mg/dL (ref 0.40–1.20)
GFR: 61.13 mL/min (ref >60.00)
Glucose, Bld: 100 mg/dL — ABNORMAL HIGH (ref 70–99)
Potassium: 4.7 mEq/L (ref 3.5–5.1)
SODIUM: 141 meq/L (ref 135–145)
TOTAL PROTEIN: 7 g/dL (ref 6.0–8.3)

## 2016-01-22 LAB — TSH: TSH: 2.09 u[IU]/mL (ref 0.35–4.50)

## 2016-01-22 MED ORDER — LEVOTHYROXINE SODIUM 75 MCG PO TABS: ORAL_TABLET | ORAL | 3 refills | Status: DC

## 2016-01-22 NOTE — Progress Notes (Signed)
Pre visit review using our clinic review tool, if applicable. No additional management support is needed unless otherwise documented below in the visit note. 

## 2016-01-22 NOTE — Addendum Note (Signed)
Addended by: Lamar Blinks C on: 01/22/2016 09:20 PM   Modules accepted: Orders

## 2016-01-22 NOTE — Patient Instructions (Signed)
It was very nice to see you today as always!  We will check your thyroid- assuming your dosage is the same I will refill the same dose.  If any adjustmemt is needed I will let you know We will refer you to see physical therapy after the 1st of the year regarding your right shoulder. You do have some stiffness or "frozen shoulder," PT can help to improve your range of motion and prevent worsening

## 2016-01-22 NOTE — Progress Notes (Signed)
[name] [address], [place], [place] [address] [phone] [id number] [phone]  Date:  01/22/2016   Name:  Nicole Brennan   DOB:  1940-09-24   MRN:  [id number]  PCP:  Jenny Reichmann, MD    Chief Complaint: Establish Care (Pt here to est care. Former Dr. Everlene Farrier pt. c/o hair thinning, weight gain and thyoid concers pt feels that she tires more easily now. Flu vaccine today. )   History of Present Illness:  Nicole Brennan is a 75 y.o. very pleasant female patient who presents with the following:  Here today to establish care although I do know this pt from [facility].  I last saw her about 11 months ago with bronchitis.  She has history of hypothyroidism, OA, allergies, GERD.  Lab Results  Component Value Date   TSH 3.770 11/15/2014   Her last TSH level was over a year ago.  She feels like she has noted some hair loss, and has gained a few lbs since June. Would like to make sure that her TSH is still in range.   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  She is not aware of any particular injury. The shoulder does make it a bit hard for her to brush her hair, etc She will occasionally use some ibuprofen for her shoulder and uses biofreeze as needed She gets her synthroid from [facility] mail away- member ID number [id number]  She is fasting today except for tea Flu shot today   Patient Active Problem List   Diagnosis Date Noted  . Seasonal allergies 04/21/2012  . Cataract 04/21/2012  . Osteoporosis  05/01/2011  . Hypothyroidism 04/30/2011  . Osteoarthritis 04/30/2011    Past Medical History:  Diagnosis Date  . Allergy   . Arthritis   . Cataract   . Thyroid disease     Past Surgical History:  Procedure Laterality Date  . DG  BONE DENSITY ([facility] HX)    . KNEE SURGERY    . TUBAL LIGATION      Social History  Substance Use Topics  . Smoking status: Never Smoker  . Smokeless tobacco: Never Used  . Alcohol use No    Family History  Problem Relation Age of Onset  . Cancer Mother   . Vascular Disease Mother   . Dementia Father     Allergies  Allergen Reactions  . Penicillins Hives    Medication list has been reviewed and updated.  Current Outpatient Prescriptions on File Prior to Visit  Medication Sig Dispense Refill  . benzonatate (TESSALON) 100 MG capsule Take 1-2 capsules (100-200 mg total) by mouth 3 (three) times daily as needed for cough. (Patient not taking: Reported on 09/04/2015) 40 capsule 0  . calcium carbonate 200 MG capsule Take 250 mg by mouth 2 (two) times daily with a meal.    . Carboxymethylcellulose Sodium (THERATEARS OP) Apply to eye.    Marland Kitchen  HYDROcodone-homatropine (HYCODAN) 5-1.5 MG/5ML syrup Take 2.5-5 mLs by mouth every 8 (eight) hours as needed for cough. (Patient not taking: Reported on 09/04/2015) 180 mL 0  . levothyroxine (SYNTHROID, LEVOTHROID) 75 MCG tablet Take 1 tablet daily 90 tablet 3  . montelukast (SINGULAIR) 10 MG tablet Take 1 tablet (10 mg total) by mouth at bedtime. 30 tablet 3  . Multiple Vitamins-Minerals (MULTIVITAMIN WITH MINERALS) tablet Take 1 tablet by mouth daily.    . ranitidine (ZANTAC) 150 MG tablet Take 1 tablet (150 mg total) by mouth 2 (two) times daily. 60 tablet 3   No current facility-administered medications on file prior to visit.     Review of Systems:  As per HPI- otherwise negative.  No fever, chills, nausea, vomiting, diarrhea   Physical Examination: Vitals:   01/22/16 0948  BP: 106/64  Pulse: 83   Temp: 97.8 F (36.6 C)   Vitals:   01/22/16 0948  Weight: 186 lb 3.2 oz (84.5 kg)  Height: 5' 5.5" (1.664 m)   Body mass index is 30.51 kg/m. Ideal Body Weight: Weight in (lb) to have BMI = 25: 152.2  GEN: WDWN, NAD, Non-toxic, A & O x 3, looks well, overweight HEENT: Atraumatic, Normocephalic. Neck supple. No masses, No LAD. Ears and Nose: No external deformity. CV: RRR, No M/G/R. No JVD. No thrill. No extra heart sounds. PULM: CTA B, no wheezes, crackles, rhonchi. No retractions. No resp. distress. No accessory muscle use. EXTR: No c/c/e NEURO Normal gait.  PSYCH: Normally interactive. Conversant. Not depressed or anxious appearing.  Calm demeanor.  Right shoulder:  She has a frozen shoulder- active and passive ROM are limited in flexion, abduction, internal rotation.   She has normal biceps and deltoid strength  Assessment and Plan: Hypothyroidism due to acquired atrophy of thyroid - Plan: TSH  Adhesive capsulitis of right shoulder - Plan: Ambulatory referral to Physical Therapy  Screening for deficiency anemia - Plan: CBC  Dyslipidemia - Plan: Lipid panel  Screening for diabetes mellitus - Plan: Comprehensive metabolic panel  Immunization due - Plan: Flu vaccine HIGH DOSE PF  Here today for a follow-up visit Will check her TSH and other labs as above Flu shot today Referral to PT to work on her shoulder problem.  At this time she is not interested in shoulder surgery  Signed Lamar Blinks, MD

## 2016-01-25 ENCOUNTER — Telehealth: Payer: Self-pay | Admitting: Emergency Medicine

## 2016-01-25 NOTE — Telephone Encounter (Signed)
°  Relation to [id number] Call back number:[phone]  Reason for call:  Patient inquiring about lab results

## 2016-01-29 ENCOUNTER — Ambulatory Visit: Payer: Medicare Other | Attending: Family Medicine | Admitting: Physical Therapy

## 2016-01-29 DIAGNOSIS — G8929 Other chronic pain: Secondary | ICD-10-CM | POA: Insufficient documentation

## 2016-01-29 DIAGNOSIS — M25611 Stiffness of right shoulder, not elsewhere classified: Secondary | ICD-10-CM

## 2016-01-29 DIAGNOSIS — R293 Abnormal posture: Secondary | ICD-10-CM | POA: Diagnosis not present

## 2016-01-29 DIAGNOSIS — M25511 Pain in right shoulder: Secondary | ICD-10-CM | POA: Diagnosis not present

## 2016-01-29 DIAGNOSIS — M6281 Muscle weakness (generalized): Secondary | ICD-10-CM | POA: Diagnosis not present

## 2016-01-29 NOTE — Patient Instructions (Signed)
Cane Exercise: Flexion    Lie on back, holding cane above chest. Keeping arms as straight as possible, lower cane toward floor beyond head. Hold __5-10__ seconds. Repeat __15__ times. Do __2__ sessions per day.    SHOULDER: External Rotation - Supine (Cane)    Hold cane with both hands. Rotate arm away from body. Keep elbow on floor and next to body. _15__ reps per set, _2__ sets per day,  Add towel to keep elbow at side.  Copyright  VHI. All rights reserved.      Cane Exercise: External Rotation    Lie with elbows on surface, even with shoulders. Hold cane above chest, palms toward toes. Lower arms back as far as possible. Keep elbows on surface. Hold __5-10__ seconds. Repeat _15___ times. Do __2__ sessions per day.    Cane Exercise: Abduction    Hold cane with right hand over end, palm-up, with other hand palm-down. Move arm out from side and up by pushing with other arm. Hold _5-10___ seconds. Repeat __15__ times. Do _2___ sessions per day.

## 2016-01-29 NOTE — Telephone Encounter (Signed)
Called pt to inform her that lab results should have been mailed to her. Went over results over the phone and also mailed a new copy of labs to pt.

## 2016-01-29 NOTE — Therapy (Signed)
[facility] [address]  [place], [place], [address] Phone: [phone]   Fax:  [phone]  Physical Therapy Evaluation  Patient Details  Name: KATHEINE LEVELL MRN: [id number] Date of Birth: [birth year] Referring Provider: Dr. Lamar Blinks   Encounter Date: 01/29/2016      PT End of Session - 01/29/16 1608    Visit Number 1   Number of Visits 16   Date for PT Re-Evaluation 03/25/16   PT Start Time [id number]   PT Stop Time 1449   PT Time Calculation (min) 47 min   Activity Tolerance Patient tolerated treatment well   Behavior During Therapy [facility] for tasks assessed/performed      Past Medical History:  Diagnosis Date  . Allergy   . Arthritis   . Cataract   . Thyroid disease     Past Surgical History:  Procedure Laterality Date  . DG  BONE DENSITY ([facility] HX)    . KNEE SURGERY    . TUBAL LIGATION      There were no vitals filed for this visit.       Subjective Assessment - 01/29/16 1405    Subjective Patient reporting difficulty using R UE - difficulty raising R arm overhead. 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 Has most difficulty reaching for items above head, dressing, difficulty fastening bra, and washing hair. Reports this issue has lasted >6 months. Patient reports use of ibuprofen intermittently - little relief. Able to sleep on shoulder.   Limitations Lifting;House hold activities   Diagnostic tests none   Patient Stated Goals wants to regian use of R arm, reduce pain   Currently in Pain? Yes   Pain Score 3    Pain Location Shoulder   Pain Orientation Right   Pain Descriptors / Indicators Aching   Pain Type Chronic pain   Pain Onset More than a month ago   Pain Frequency Constant   Aggravating Factors  reaching overhead, prolonged driving   Pain Relieving Factors resting, sleeping            [facility] PT Assessment - 01/29/16 1413       Assessment   Medical Diagnosis Adhesive capsulitis of R shoulder   Referring Provider Dr. Janett Billow Copland    Onset Date/Surgical Date --  >6 months   Hand Dominance Right   Next MD Visit prn   Prior Therapy none     Precautions   Precautions None     Balance Screen   Has the patient fallen in the past 6 months No   Has the patient had a decrease in activity level because of a fear of falling?  Yes   Is the patient reluctant to leave their home because of a fear of falling?  No     Home Ecologist residence   Living Arrangements Spouse/significant other     Prior Function   Level of Independence Independent   Vocation Retired   Psychologist, clinical - 5x/week and church on [place] dog walking, travel      Cognition   Overall Cognitive Status Within Functional Limits for tasks assessed     Observation/Other Assessments   Focus on Therapeutic Outcomes (FOTO)  55 (45% limited, predicted 34% limited)     Posture/Postural Control   Posture/Postural Control Postural limitations   Postural Limitations Rounded Shoulders;Forward head  ROM / Strength   AROM / PROM / Strength AROM;PROM;Strength     AROM   AROM Assessment Site Shoulder   Right/Left Shoulder Right;Left   Right Shoulder Flexion 122 Degrees   Right Shoulder ABduction 123 Degrees   Right Shoulder Internal Rotation --  functional to approx L3   Right Shoulder External Rotation --  functional to T1   Left Shoulder Flexion 164 Degrees   Left Shoulder ABduction 168 Degrees   Left Shoulder Internal Rotation --  functional to mid back/bra area   Left Shoulder External Rotation --  functional to T1     PROM   PROM Assessment Site Shoulder   Right/Left Shoulder Right;Left   Right Shoulder Flexion 140 Degrees   Right Shoulder ABduction 122 Degrees   Right Shoulder Internal Rotation 65 Degrees   Right Shoulder External Rotation 75 Degrees      Strength   Strength Assessment Site Shoulder;Elbow   Right/Left Shoulder Right;Left   Right Shoulder Flexion 4-/5  some pain   Right Shoulder ABduction 4-/5  some pain   Right Shoulder Internal Rotation 4/5   Right Shoulder External Rotation 3+/5   Left Shoulder Flexion 4/5   Left Shoulder ABduction 4/5   Left Shoulder Internal Rotation 4/5   Left Shoulder External Rotation 4/5   Right/Left Elbow Right;Left   Right Elbow Flexion 4/5   Right Elbow Extension 4/5   Left Elbow Flexion 4/5   Left Elbow Extension 4/5     Special Tests    Special Tests Rotator Cuff Impingement   Rotator Cuff Impingment tests Empty Can test;Lift- off test     Lift-Off test   Findings Negative   Side Right     Empty Can test   Findings Negative   Side Right                           PT Education - 01/29/16 1607    Education provided Yes   Education Details exam findings, POC, initial HEP   Person(s) Educated Patient   Methods Explanation;Demonstration;Handout   Comprehension Verbalized understanding;Returned demonstration;Need further instruction          PT Short Term Goals - 01/29/16 1709      PT SHORT TERM GOAL #1   Title patient to be independent with HEP (02/26/16)   Status New     PT SHORT TERM GOAL #2   Title Patient to demonstrate PROM of R shoulder equal to that of L shoulder (02/26/16)   Status New           PT Long Term Goals - 01/29/16 1710      PT LONG TERM GOAL #1   Title Patient to be independent with advanced HEP (03/25/16)   Status New     PT LONG TERM GOAL #2   Title Patient to improve R shoulder AROM equal to that of L shoulder for improved functional use of R UE (03/25/16)   Status New     PT LONG TERM GOAL #3   Title Patient to improve R shoulder strength to >/= 4+/5 with no increase in pain (03/25/16)   Status New     PT LONG TERM GOAL #4   Title Patient to verbalize as well as demonstrate ability to self correct posture for  reduce stress placed on neck/shoulder musculature for improved shoulder biomechanics (03/25/16)   Status New     PT LONG TERM GOAL #5  Title Patient to report ability to wash hair, lift small items above head (1-2#), as well as clasp bra demonstrating improved functional use of R UE (03/25/16)   Status New               Plan - 01/29/16 1609    Clinical Impression Statement Xandra is a 75 y/o F presenting to OPPT today with primary complaints of reduced functional use of R shouler with associated pain. Patient today demonstrated reduced ROM of R shoulder as compared to L, with AROM near equal to PROM, reduced R shoulder strength, as well as reduced postual awareness with rounded shoulders. Patients signs and symptoms consistent with adhesive capsulitis with greatest limitations in flexion, abduction, and internal rotation. Patient to benefit from PT to address the above listed deficits to allow paitnet to regain functional use of R UE.    Rehab Potential Good   PT Frequency 2x / week   PT Duration 8 weeks   PT Treatment/Interventions ADLs/Self Care Home Management;Cryotherapy;Electrical Stimulation;Iontophoresis 4mg /ml Dexamethasone;Moist Heat;Ultrasound;Therapeutic exercise;Therapeutic activities;Patient/family education;Manual techniques;Passive range of motion;Vasopneumatic Device;Taping;Dry needling   PT Next Visit Plan progress ROM   Consulted and Agree with Plan of Care Patient      Patient will benefit from skilled therapeutic intervention in order to improve the following deficits and impairments:  Decreased activity tolerance, Decreased range of motion, Decreased strength, Impaired UE functional use, Pain  Visit Diagnosis: Stiffness of right shoulder, not elsewhere classified - Plan: PT plan of care cert/re-cert  Chronic right shoulder pain - Plan: PT plan of care cert/re-cert  Muscle weakness (generalized) - Plan: PT plan of care cert/re-cert  Abnormal posture - Plan: PT  plan of care cert/re-cert      G-Codes - [id number]    Functional Assessment Tool Used FOTO: 55 (45% limited)   Functional Limitation Carrying, moving and handling objects   Carrying, Moving and Handling Objects Current Status [id number]) At least 40 percent but less than 60 percent impaired, limited or restricted   Carrying, Moving and Handling Objects Goal Status [id number]) At least 20 percent but less than 40 percent impaired, limited or restricted       Problem List Patient Active Problem List   Diagnosis Date Noted  . Seasonal allergies 04/21/2012  . Cataract 04/21/2012  . Osteoporosis 05/01/2011  . Hypothyroidism 04/30/2011  . Osteoarthritis 04/30/2011      [name], PT, DPT 01/29/16 5:24 PM     [facility] [address]  [place], [place], [address] Phone: [phone]   Fax:  [phone]  Name: DALIYA SUMI MRN: [id number] Date of Birth: 01/25/1941

## 2016-01-29 NOTE — Telephone Encounter (Signed)
Patient is following up on her lab results. She is getting frustrated because she hasn't been informed of her lab results. Please advise.

## 2016-02-05 ENCOUNTER — Ambulatory Visit: Payer: Medicare Other | Admitting: Physical Therapy

## 2016-02-05 DIAGNOSIS — M25511 Pain in right shoulder: Secondary | ICD-10-CM

## 2016-02-05 DIAGNOSIS — M6281 Muscle weakness (generalized): Secondary | ICD-10-CM | POA: Diagnosis not present

## 2016-02-05 DIAGNOSIS — M25611 Stiffness of right shoulder, not elsewhere classified: Secondary | ICD-10-CM | POA: Diagnosis not present

## 2016-02-05 DIAGNOSIS — R293 Abnormal posture: Secondary | ICD-10-CM

## 2016-02-05 DIAGNOSIS — G8929 Other chronic pain: Secondary | ICD-10-CM

## 2016-02-05 NOTE — Therapy (Signed)
[facility] [address]  [place], [place], [address] Phone: [phone]   Fax:  [phone]  Physical Therapy Treatment  Patient Details  Name: Nicole Brennan MRN: [id number] Date of Birth: 1940-08-28 Referring Provider: Dr. Lamar Blinks   Encounter Date: 02/05/2016      PT End of Session - 02/05/16 1625    Visit Number 2   Number of Visits 16   Date for PT Re-Evaluation 03/25/16   PT Start Time 1400   PT Stop Time 1446   PT Time Calculation (min) 46 min   Activity Tolerance Patient tolerated treatment well   Behavior During Therapy [facility] for tasks assessed/performed      Past Medical History:  Diagnosis Date  . Allergy   . Arthritis   . Cataract   . Thyroid disease     Past Surgical History:  Procedure Laterality Date  . DG  BONE DENSITY ([facility] HX)    . KNEE SURGERY    . TUBAL LIGATION      There were no vitals filed for this visit.      Subjective Assessment - 02/05/16 1401    Subjective Patient has been perfomring HEP daily, feels like she can reach a little higher. Only has pain when touching "bone" as well as when she "pushes it" going higher.    Limitations Lifting;House hold activities   Patient Stated Goals wants to regian use of R arm, reduce pain   Currently in Pain? No/denies   Pain Score 0-No pain   Aggravating Factors  reaching overhead, prolonged driving   Pain Relieving Factors resting, sleeping                         [facility] Adult PT Treatment/Exercise - 02/05/16 1403      Exercises   Exercises Shoulder     Shoulder Exercises: Supine   External Rotation AAROM;Right;5 reps   External Rotation Limitations 5-10 second hold at end range   Internal Rotation --   Internal Rotation Limitations --   Flexion AAROM;Right;5 reps   Flexion Limitations 5-10 second hold into end range   ABduction AAROM;Right;5 reps   ABduction Limitations 5-10 second hold at end  range     Shoulder Exercises: Seated   Horizontal ABduction Both;10 reps;Theraband   Theraband Level (Shoulder Horizontal ABduction) Level 1 (Yellow)   Horizontal ABduction Limitations with scap squeeze    External Rotation Both;10 reps;Theraband   Theraband Level (Shoulder External Rotation) Level 1 (Yellow)   External Rotation Limitations with scap squeeze     Shoulder Exercises: Standing   Retraction Both;10 reps   Retraction Limitations 5 second hold      Shoulder Exercises: ROM/Strengthening   UBE (Upper Arm Bike) level 2 x 6 minutes (3' fwd/ 3' bwd)     Shoulder Exercises: Stretch   Internal Rotation Stretch 1 rep   Internal Rotation Stretch Limitations 30 seconds with towel behind back - added to HEP     Manual Therapy   Manual Therapy Passive ROM   Manual therapy comments R shoulder - flexion, abduction, ER, and IR - 10 reps with 5-10 second hold at end range                   PT Short Term Goals - 01/29/16 1709      PT SHORT TERM GOAL #1   Title patient to be independent with HEP (02/26/16)  Status New     PT SHORT TERM GOAL #2   Title Patient to demonstrate PROM of R shoulder equal to that of L shoulder (02/26/16)   Status New           PT Long Term Goals - 01/29/16 1710      PT LONG TERM GOAL #1   Title Patient to be independent with advanced HEP (03/25/16)   Status New     PT LONG TERM GOAL #2   Title Patient to improve R shoulder AROM equal to that of L shoulder for improved functional use of R UE (03/25/16)   Status New     PT LONG TERM GOAL #3   Title Patient to improve R shoulder strength to >/= 4+/5 with no increase in pain (03/25/16)   Status New     PT LONG TERM GOAL #4   Title Patient to verbalize as well as demonstrate ability to self correct posture for reduce stress placed on neck/shoulder musculature for improved shoulder biomechanics (03/25/16)   Status New     PT LONG TERM GOAL #5   Title Patient to report ability to wash  hair, lift small items above head (1-2#), as well as clasp bra demonstrating improved functional use of R UE (03/25/16)   Status New               Plan - 02/05/16 1627    Clinical Impression Statement Patient today requiring continued education for proper form for all HEP stretching tasks. Patient with subjective reports of feeling able to move R shoulder more, however, abduction continues to be most limited and painful with both PROM and AROM. Treatment session focusing on improving ROM as well as scapular stabilization strengthening with patient able to perform actively with no increase in pain. Patient to continue to benefit from PT to maximize functional use of R UE.    PT Treatment/Interventions ADLs/Self Care Home Management;Cryotherapy;Electrical Stimulation;Iontophoresis 4mg /ml Dexamethasone;Moist Heat;Ultrasound;Therapeutic exercise;Therapeutic activities;Patient/family education;Manual techniques;Passive range of motion;Vasopneumatic Device;Taping;Dry needling   PT Next Visit Plan progress ROM/scapular strength   Consulted and Agree with Plan of Care Patient      Patient will benefit from skilled therapeutic intervention in order to improve the following deficits and impairments:  Decreased activity tolerance, Decreased range of motion, Decreased strength, Impaired UE functional use, Pain  Visit Diagnosis: Stiffness of right shoulder, not elsewhere classified  Chronic right shoulder pain  Muscle weakness (generalized)  Abnormal posture     Problem List Patient Active Problem List   Diagnosis Date Noted  . Seasonal allergies 04/21/2012  . Cataract 04/21/2012  . Osteoporosis 05/01/2011  . Hypothyroidism 04/30/2011  . Osteoarthritis 04/30/2011      [name], PT, DPT 02/05/16 4:31 PM    [facility] [address]  [place], [place], [address] Phone: [phone]   Fax:   [phone]  Name: Nicole Brennan MRN: [id number] Date of Birth: 1941/02/16

## 2016-02-05 NOTE — Patient Instructions (Signed)
Scapular Retraction (Standing)    With arms at sides, pinch shoulder blades together. Repeat ___15_ times per set. Do __2__ sets per session.    Resisted Horizontal Abduction: Bilateral    Sit or stand, tubing in both hands, arms out in front. Keeping arms straight, pinch shoulder blades together and stretch arms out. Repeat __15__ times per set. Do ___2_ sets per session.     External Rotation (Eccentric), (Resistance Band)    Quickly pull band outward with forearm of affected arm. Keep elbows at sides, wrists neutral. Slowly return to start for 3-5 seconds. Use __yellow______ resistance band. Hint: Use towel roll between elbow and hip. __15_ reps per set, _2__ sets per day,

## 2016-02-07 ENCOUNTER — Ambulatory Visit: Payer: Medicare Other

## 2016-02-07 DIAGNOSIS — G8929 Other chronic pain: Secondary | ICD-10-CM | POA: Diagnosis not present

## 2016-02-07 DIAGNOSIS — M6281 Muscle weakness (generalized): Secondary | ICD-10-CM | POA: Diagnosis not present

## 2016-02-07 DIAGNOSIS — M25511 Pain in right shoulder: Secondary | ICD-10-CM

## 2016-02-07 DIAGNOSIS — R293 Abnormal posture: Secondary | ICD-10-CM

## 2016-02-07 DIAGNOSIS — M25611 Stiffness of right shoulder, not elsewhere classified: Secondary | ICD-10-CM

## 2016-02-07 NOTE — Therapy (Signed)
[facility] [address]  [place], [place], [address] Phone: [phone]   Fax:  [phone]  Physical Therapy Treatment  Patient Details  Name: Nicole Brennan MRN: [id number] Date of Birth: January 27, 1941 Referring Provider: Dr. Lamar Blinks   Encounter Date: 02/07/2016      PT End of Session - 02/07/16 1328    Visit Number 3   Number of Visits 16   Date for PT Re-Evaluation 03/25/16   PT Start Time [id number]   PT Stop Time 1400   PT Time Calculation (min) 45 min   Activity Tolerance Patient tolerated treatment well   Behavior During Therapy [facility] for tasks assessed/performed      Past Medical History:  Diagnosis Date  . Allergy   . Arthritis   . Cataract   . Thyroid disease     Past Surgical History:  Procedure Laterality Date  . DG  BONE DENSITY ([place] HX)    . KNEE SURGERY    . TUBAL LIGATION      There were no vitals filed for this visit.      Subjective Assessment - 02/07/16 1315    Subjective Pt. reporting she has been performing current HEP however still feels like she needs to review some of these.     Patient Stated Goals wants to regian use of R arm, reduce pain   Currently in Pain? No/denies   Pain Score 0-No pain   Multiple Pain Sites No            [facility] Adult PT Treatment/Exercise - 02/07/16 1352      Shoulder Exercises: Supine   External Rotation AAROM;Right;10 reps   External Rotation Limitations 5-10 second hold at end range   Flexion AAROM;Right;10 reps   ABduction AAROM;Right;10 reps   ABduction Limitations 5-10 second hold at end range     Shoulder Exercises: Sidelying   External Rotation Weight (lbs) 1   External Rotation Limitations x 15 reps   ABduction Weight (lbs) 2   ABduction Limitations x 10 reps    Other Sidelying Exercises Sidelying R shoulder circles 2# CW, CCW in 90 dg abduction x 20 reps each way     Shoulder Exercises: Standing   Protraction AROM;15  reps;Both   Protraction Weight (lbs) 2   External Rotation 15 reps   Theraband Level (Shoulder External Rotation) Level 1 (Yellow)   Retraction Both;10 reps   Theraband Level (Shoulder Retraction) Level 3 (Green)   Retraction Limitations 5 second hold    Other Standing Exercises R shoulder circles with 2# CW, CCW in 90 dg flexion x 20 reps each way                  PT Short Term Goals - 02/07/16 1328      PT SHORT TERM GOAL #1   Title patient to be independent with HEP (02/26/16)   Status On-going     PT SHORT TERM GOAL #2   Title Patient to demonstrate PROM of R shoulder equal to that of L shoulder (02/26/16)   Status On-going           PT Long Term Goals - 02/07/16 1329      PT LONG TERM GOAL #1   Title Patient to be independent with advanced HEP (03/25/16)   Status On-going     PT LONG TERM GOAL #2   Title Patient to improve R shoulder AROM equal to that of L  shoulder for improved functional use of R UE (03/25/16)   Status On-going     PT LONG TERM GOAL #3   Title Patient to improve R shoulder strength to >/= 4+/5 with no increase in pain (03/25/16)   Status On-going     PT LONG TERM GOAL #4   Title Patient to verbalize as well as demonstrate ability to self correct posture for reduce stress placed on neck/shoulder musculature for improved shoulder biomechanics (03/25/16)   Status On-going     PT LONG TERM GOAL #5   Title Patient to report ability to wash hair, lift small items above head (1-2#), as well as clasp bra demonstrating improved functional use of R UE (03/25/16)   Status On-going               Plan - 02/07/16 1329    Clinical Impression Statement Pt. tolerated continued scapular and shoulder strengthening and stability activities very well today however continues to require frequent verbal cueing with all therex especially ER at R shoulder.  Pt. pain free today and able to progress reps and resistance with most activities.  Pt. would  benefit from further skilled instruction to maximize R shoulder strength and ROM to improve function.     PT Treatment/Interventions ADLs/Self Care Home Management;Cryotherapy;Electrical Stimulation;Iontophoresis 4mg /ml Dexamethasone;Moist Heat;Ultrasound;Therapeutic exercise;Therapeutic activities;Patient/family education;Manual techniques;Passive range of motion;Vasopneumatic Device;Taping;Dry needling   PT Next Visit Plan progress ROM/scapular strength      Patient will benefit from skilled therapeutic intervention in order to improve the following deficits and impairments:  Decreased activity tolerance, Decreased range of motion, Decreased strength, Impaired UE functional use, Pain  Visit Diagnosis: Stiffness of right shoulder, not elsewhere classified  Chronic right shoulder pain  Muscle weakness (generalized)  Abnormal posture     Problem List Patient Active Problem List   Diagnosis Date Noted  . Seasonal allergies 04/21/2012  . Cataract 04/21/2012  . Osteoporosis 05/01/2011  . Hypothyroidism 04/30/2011  . Osteoarthritis 04/30/2011    Bess Harvest, PTA 02/07/16 2:16 PM  [facility] [address]  [place], [place], [address] Phone: [phone]   Fax:  [phone]  Name: Nicole Brennan MRN: [id number] Date of Birth: 09/21/1940

## 2016-02-14 ENCOUNTER — Ambulatory Visit: Payer: Medicare Other | Attending: Family Medicine

## 2016-02-14 DIAGNOSIS — G8929 Other chronic pain: Secondary | ICD-10-CM

## 2016-02-14 DIAGNOSIS — M25511 Pain in right shoulder: Secondary | ICD-10-CM | POA: Insufficient documentation

## 2016-02-14 DIAGNOSIS — M25611 Stiffness of right shoulder, not elsewhere classified: Secondary | ICD-10-CM | POA: Diagnosis not present

## 2016-02-14 DIAGNOSIS — M6281 Muscle weakness (generalized): Secondary | ICD-10-CM | POA: Diagnosis not present

## 2016-02-14 DIAGNOSIS — R293 Abnormal posture: Secondary | ICD-10-CM | POA: Diagnosis not present

## 2016-02-14 NOTE — Therapy (Signed)
[facility] [address]  [place], [place], [address] Phone: [phone]   Fax:  [phone]  Physical Therapy Treatment  Patient Details  Name: Nicole Brennan MRN: [id number] Date of Birth: March 26, 1940 Referring Provider: Dr. Lamar Blinks   Encounter Date: 02/14/2016      PT End of Session - 02/14/16 1501    Visit Number 4   Number of Visits 16   Date for PT Re-Evaluation 03/25/16   PT Start Time [id number]   PT Stop Time 1530   PT Time Calculation (min) 45 min   Activity Tolerance Patient tolerated treatment well   Behavior During Therapy [facility] for tasks assessed/performed      Past Medical History:  Diagnosis Date  . Allergy   . Arthritis   . Cataract   . Thyroid disease     Past Surgical History:  Procedure Laterality Date  . DG  BONE DENSITY ([place] HX)    . KNEE SURGERY    . TUBAL LIGATION      There were no vitals filed for this visit.      Subjective Assessment - 02/14/16 1457    Subjective Pt. reporting she has been very busy playing shows and feels that her R shoulder is much better at this point.     Patient Stated Goals wants to regian use of R arm, reduce pain   Currently in Pain? No/denies   Pain Score 0-No pain   Multiple Pain Sites No             [facility] Adult PT Treatment/Exercise - 02/14/16 1504      Shoulder Exercises: Supine   External Rotation AAROM;Right;15 reps   External Rotation Limitations 5-10 second hold at end range   Flexion 15 reps;AROM   Shoulder Flexion Weight (lbs) 3   Flexion Limitations 0-100 dg      Shoulder Exercises: Sidelying   External Rotation AROM;15 reps;Right;Weights   External Rotation Weight (lbs) 2   External Rotation Limitations x 15 reps   ABduction AROM;15 reps;Right;Weights   ABduction Weight (lbs) 2   ABduction Limitations x 15 reps    Other Sidelying Exercises Sidelying R shoulder circles 3# CW, CCW in 90 dg abduction x 20 reps each way      Shoulder Exercises: Standing   Other Standing Exercises Standing R shouder flexion, abduction on pegboard x 10 reps each side     Manual Therapy   Manual Therapy Passive ROM   Manual therapy comments R shoulder - flexion, abduction, ER, and IR - 10 reps with 5-10 second hold at end range                   PT Short Term Goals - 02/07/16 1328      PT SHORT TERM GOAL #1   Title patient to be independent with HEP (02/26/16)   Status On-going     PT SHORT TERM GOAL #2   Title Patient to demonstrate PROM of R shoulder equal to that of L shoulder (02/26/16)   Status On-going           PT Long Term Goals - 02/07/16 1329      PT LONG TERM GOAL #1   Title Patient to be independent with advanced HEP (03/25/16)   Status On-going     PT LONG TERM GOAL #2   Title Patient to improve R shoulder AROM equal to that of L shoulder for improved functional  use of R UE (03/25/16)   Status On-going     PT LONG TERM GOAL #3   Title Patient to improve R shoulder strength to >/= 4+/5 with no increase in pain (03/25/16)   Status On-going     PT LONG TERM GOAL #4   Title Patient to verbalize as well as demonstrate ability to self correct posture for reduce stress placed on neck/shoulder musculature for improved shoulder biomechanics (03/25/16)   Status On-going     PT LONG TERM GOAL #5   Title Patient to report ability to wash hair, lift small items above head (1-2#), as well as clasp bra demonstrating improved functional use of R UE (03/25/16)   Status On-going               Plan - 02/14/16 1501    Clinical Impression Statement All supine and sidelying shoulder strengthening therex progressed today in reps and resistance without issue.  Pt. still with, "twinges" of pain with overhead ER, flexion, abduction motions however reporting she can, "go back further than before".  Standing flexion, abduction on peg-board initiated today with pt. at ~ 150 dg on each without pain.  Pt. to  return on 12/8 for appointment with PT and verbalized that she feels she may not need more therapy following this.   PT Treatment/Interventions ADLs/Self Care Home Management;Cryotherapy;Electrical Stimulation;Iontophoresis 4mg /ml Dexamethasone;Moist Heat;Ultrasound;Therapeutic exercise;Therapeutic activities;Patient/family education;Manual techniques;Passive range of motion;Vasopneumatic Device;Taping;Dry needling   PT Next Visit Plan progress ROM/scapular strength      Patient will benefit from skilled therapeutic intervention in order to improve the following deficits and impairments:  Decreased activity tolerance, Decreased range of motion, Decreased strength, Impaired UE functional use, Pain  Visit Diagnosis: Stiffness of right shoulder, not elsewhere classified  Chronic right shoulder pain  Muscle weakness (generalized)  Abnormal posture     Problem List Patient Active Problem List   Diagnosis Date Noted  . Seasonal allergies 04/21/2012  . Cataract 04/21/2012  . Osteoporosis 05/01/2011  . Hypothyroidism 04/30/2011  . Osteoarthritis 04/30/2011    Bess Harvest, PTA 02/14/16 3:56 PM  [facility] [address]  [place], [place], [address] Phone: [phone]   Fax:  [phone]  Name: Nicole Brennan MRN: [id number] Date of Birth: 1940/11/22

## 2016-02-16 ENCOUNTER — Ambulatory Visit: Payer: Medicare Other | Admitting: Physical Therapy

## 2016-02-16 DIAGNOSIS — G8929 Other chronic pain: Secondary | ICD-10-CM

## 2016-02-16 DIAGNOSIS — M25511 Pain in right shoulder: Secondary | ICD-10-CM

## 2016-02-16 DIAGNOSIS — R293 Abnormal posture: Secondary | ICD-10-CM | POA: Diagnosis not present

## 2016-02-16 DIAGNOSIS — M6281 Muscle weakness (generalized): Secondary | ICD-10-CM

## 2016-02-16 DIAGNOSIS — M25611 Stiffness of right shoulder, not elsewhere classified: Secondary | ICD-10-CM | POA: Diagnosis not present

## 2016-02-16 NOTE — Therapy (Signed)
[facility] [address]  [place], [place], [address] Phone: [phone]   Fax:  [phone]  Physical Therapy Treatment  Patient Details  Name: Nicole Brennan MRN: [id number] Date of Birth: 10-11-1940 Referring Provider: Dr. Lamar Blinks   Encounter Date: 02/16/2016      PT End of Session - 02/16/16 1050    Visit Number 5   Number of Visits 16   Date for PT Re-Evaluation 03/25/16   PT Start Time [id number]   PT Stop Time 1129   PT Time Calculation (min) 42 min   Activity Tolerance Patient tolerated treatment well   Behavior During Therapy [facility] for tasks assessed/performed      Past Medical History:  Diagnosis Date  . Allergy   . Arthritis   . Cataract   . Thyroid disease     Past Surgical History:  Procedure Laterality Date  . DG  BONE DENSITY ([place] HX)    . KNEE SURGERY    . TUBAL LIGATION      There were no vitals filed for this visit.      Subjective Assessment - 02/16/16 1047    Subjective Patient continues to be busy with piano playing - easier to shampoo hair, small difficulty with clasping bra behind back   Patient Stated Goals wants to regian use of R arm, reduce pain   Currently in Pain? No/denies   Pain Score 0-No pain                         [facility] Adult PT Treatment/Exercise - 02/16/16 1052      Shoulder Exercises: Sidelying   External Rotation Strengthening;Left;15 reps   External Rotation Weight (lbs) 3   ABduction Strengthening;Left;15 reps;Weights   ABduction Weight (lbs) 3     Shoulder Exercises: Standing   Horizontal ABduction Strengthening;Both;15 reps;Theraband   Theraband Level (Shoulder Horizontal ABduction) Level 3 (Green)   Horizontal ABduction Limitations with scap squeeze   External Rotation Strengthening;Left;15 reps;Theraband  B UE - elbows by side with scap squeeze   Theraband Level (Shoulder External Rotation) Level 3 (Green)   Row  Strengthening;Both;15 reps;Theraband   Theraband Level (Shoulder Row) Level 3 (Green)     Shoulder Exercises: ROM/Strengthening   UBE (Upper Arm Bike) level 3 x 6 minutes (3' fwd/ 3' bwd)   Rhythmic Stabilization, Supine CW/CCW circles x 20, A-Z with yellow medball     Manual Therapy   Manual Therapy Passive ROM   Manual therapy comments R shoulder - flexion, abduction, ER, and IR - 10 reps with 5-10 second hold at end range                   PT Short Term Goals - 02/07/16 1328      PT SHORT TERM GOAL #1   Title patient to be independent with HEP (02/26/16)   Status On-going     PT SHORT TERM GOAL #2   Title Patient to demonstrate PROM of R shoulder equal to that of L shoulder (02/26/16)   Status On-going           PT Long Term Goals - 02/07/16 1329      PT LONG TERM GOAL #1   Title Patient to be independent with advanced HEP (03/25/16)   Status On-going     PT LONG TERM GOAL #2   Title Patient to improve R shoulder AROM equal to that of  L shoulder for improved functional use of R UE (03/25/16)   Status On-going     PT LONG TERM GOAL #3   Title Patient to improve R shoulder strength to >/= 4+/5 with no increase in pain (03/25/16)   Status On-going     PT LONG TERM GOAL #4   Title Patient to verbalize as well as demonstrate ability to self correct posture for reduce stress placed on neck/shoulder musculature for improved shoulder biomechanics (03/25/16)   Status On-going     PT LONG TERM GOAL #5   Title Patient to report ability to wash hair, lift small items above head (1-2#), as well as clasp bra demonstrating improved functional use of R UE (03/25/16)   Status On-going               Plan - 02/16/16 1137    Clinical Impression Statement Patient reporting she feels as if she is getting better, however, continues to have some difficulties with claspingbra behind back as well as reaching straight forward overhead for an object. Patient with some pain at  end range with PROM and slight overpressure. PT recommending conitnuing PT treatmetn to continue to progress functional use of UE.    PT Treatment/Interventions ADLs/Self Care Home Management;Cryotherapy;Electrical Stimulation;Iontophoresis 4mg /ml Dexamethasone;Moist Heat;Ultrasound;Therapeutic exercise;Therapeutic activities;Patient/family education;Manual techniques;Passive range of motion;Vasopneumatic Device;Taping;Dry needling   PT Next Visit Plan progress ROM/scapular strength   Consulted and Agree with Plan of Care Patient      Patient will benefit from skilled therapeutic intervention in order to improve the following deficits and impairments:  Decreased activity tolerance, Decreased range of motion, Decreased strength, Impaired UE functional use, Pain  Visit Diagnosis: Stiffness of right shoulder, not elsewhere classified  Chronic right shoulder pain  Muscle weakness (generalized)  Abnormal posture     Problem List Patient Active Problem List   Diagnosis Date Noted  . Seasonal allergies 04/21/2012  . Cataract 04/21/2012  . Osteoporosis 05/01/2011  . Hypothyroidism 04/30/2011  . Osteoarthritis 04/30/2011      [name], PT, DPT 02/16/16 11:40 AM     [facility] [address]  [place], [place], [address] Phone: [phone]   Fax:  [phone]  Name: Nicole Brennan MRN: [id number] Date of Birth: 03-02-1941

## 2016-02-21 ENCOUNTER — Ambulatory Visit: Payer: Medicare Other | Admitting: Physical Therapy

## 2016-02-21 DIAGNOSIS — G8929 Other chronic pain: Secondary | ICD-10-CM

## 2016-02-21 DIAGNOSIS — M6281 Muscle weakness (generalized): Secondary | ICD-10-CM

## 2016-02-21 DIAGNOSIS — M25611 Stiffness of right shoulder, not elsewhere classified: Secondary | ICD-10-CM | POA: Diagnosis not present

## 2016-02-21 DIAGNOSIS — R293 Abnormal posture: Secondary | ICD-10-CM | POA: Diagnosis not present

## 2016-02-21 DIAGNOSIS — M25511 Pain in right shoulder: Secondary | ICD-10-CM

## 2016-02-21 NOTE — Therapy (Signed)
[facility] [address]  [place], [place], [address] Phone: [phone]   Fax:  [phone]  Physical Therapy Treatment  Patient Details  Name: Nicole Brennan MRN: [id number] Date of Birth: 03/05/1941 Referring Provider: Dr. Lamar Blinks  Encounter Date: 02/21/2016      PT End of Session - 02/21/16 0757    Visit Number 6   Number of Visits 16   Date for PT Re-Evaluation 03/25/16   PT Start Time 0758   PT Stop Time 0842   PT Time Calculation (min) 44 min   Activity Tolerance Patient tolerated treatment well   Behavior During Therapy [facility] for tasks assessed/performed      Past Medical History:  Diagnosis Date  . Allergy   . Arthritis   . Cataract   . Thyroid disease     Past Surgical History:  Procedure Laterality Date  . DG  BONE DENSITY ([place] HX)    . KNEE SURGERY    . TUBAL LIGATION      There were no vitals filed for this visit.      Subjective Assessment - 02/21/16 0757    Subjective Patient reports shoulder is doing well - has some soreness and continues to report inability to lift it straight up.    Patient Stated Goals wants to regian use of R arm, reduce pain   Currently in Pain? No/denies   Pain Score 0-No pain            [facility] PT Assessment - 02/21/16 0001      Assessment   Medical Diagnosis Adhesive capsulitis of R shoulder   Referring Provider Dr. Janett Billow Copland     AROM   Right Shoulder Flexion 155 Degrees   Right Shoulder ABduction 142 Degrees  not true abduction - scaption     PROM   Right Shoulder Flexion 159 Degrees   Right Shoulder ABduction 136 Degrees   Right Shoulder Internal Rotation 65 Degrees   Right Shoulder External Rotation 79 Degrees                     [facility] Adult PT Treatment/Exercise - 02/21/16 0800      Shoulder Exercises: Seated   Horizontal ABduction Strengthening;Both;15 reps;Theraband   Theraband Level (Shoulder Horizontal  ABduction) Level 3 (Green)   External Rotation Strengthening;Both;15 reps;Theraband   Theraband Level (Shoulder External Rotation) Level 3 (Green)   Flexion AROM;Strengthening;Right;10 reps;Weights   Flexion Weight (lbs) 1   Abduction AROM;Strengthening;Right;10 reps;Weights   ABduction Weight (lbs) 1     Shoulder Exercises: Sidelying   External Rotation Strengthening;Right;15 reps;Weights   External Rotation Weight (lbs) 3   ABduction Strengthening;Right;15 reps;Weights   ABduction Weight (lbs) 3     Shoulder Exercises: ROM/Strengthening   UBE (Upper Arm Bike) level 3 x 6 minutes (3/3)   Rhythmic Stabilization, Supine CW/CCW circles x 20, A-Z with yellow medball     Shoulder Exercises: Stretch   Internal Rotation Stretch 3 reps  30 seconds each   Internal Rotation Stretch Limitations with towel behind back     Manual Therapy   Manual Therapy Passive ROM   Passive ROM R shoulder - flexion, abduction, ER, and IR - 10 reps with 5-10 second hold at end range                   PT Short Term Goals - 02/07/16 1328      PT SHORT TERM GOAL #  1   Title patient to be independent with HEP (02/26/16)   Status On-going     PT SHORT TERM GOAL #2   Title Patient to demonstrate PROM of R shoulder equal to that of L shoulder (02/26/16)   Status On-going           PT Long Term Goals - 02/07/16 1329      PT LONG TERM GOAL #1   Title Patient to be independent with advanced HEP (03/25/16)   Status On-going     PT LONG TERM GOAL #2   Title Patient to improve R shoulder AROM equal to that of L shoulder for improved functional use of R UE (03/25/16)   Status On-going     PT LONG TERM GOAL #3   Title Patient to improve R shoulder strength to >/= 4+/5 with no increase in pain (03/25/16)   Status On-going     PT LONG TERM GOAL #4   Title Patient to verbalize as well as demonstrate ability to self correct posture for reduce stress placed on neck/shoulder musculature for improved  shoulder biomechanics (03/25/16)   Status On-going     PT LONG TERM GOAL #5   Title Patient to report ability to wash hair, lift small items above head (1-2#), as well as clasp bra demonstrating improved functional use of R UE (03/25/16)   Status On-going               Plan - 02/21/16 0927    Clinical Impression Statement Patient progressing well with R shoulder AROM and PROM (see measurements above). Patient with some reports of continued weakness and inability to fully raise R arm overhead. Patient tolerating all progressions of R shoulder and periscapular strengthening today with no increase in pain. Patient to continue to benefit form PT to maximize functional use of R UE.    PT Treatment/Interventions ADLs/Self Care Home Management;Cryotherapy;Electrical Stimulation;Iontophoresis 4mg /ml Dexamethasone;Moist Heat;Ultrasound;Therapeutic exercise;Therapeutic activities;Patient/family education;Manual techniques;Passive range of motion;Vasopneumatic Device;Taping;Dry needling   PT Next Visit Plan progress ROM/scapular strength   Consulted and Agree with Plan of Care Patient      Patient will benefit from skilled therapeutic intervention in order to improve the following deficits and impairments:  Decreased activity tolerance, Decreased range of motion, Decreased strength, Impaired UE functional use, Pain  Visit Diagnosis: Stiffness of right shoulder, not elsewhere classified  Chronic right shoulder pain  Muscle weakness (generalized)  Abnormal posture     Problem List Patient Active Problem List   Diagnosis Date Noted  . Seasonal allergies 04/21/2012  . Cataract 04/21/2012  . Osteoporosis 05/01/2011  . Hypothyroidism 04/30/2011  . Osteoarthritis 04/30/2011      [name], PT, DPT 02/21/16 9:30 AM    [facility] [address]  [place], [place], [address] Phone: [phone]   Fax:   [phone]  Name: Nicole Brennan MRN: [id number] Date of Birth: December 13, 1940

## 2016-02-23 ENCOUNTER — Telehealth: Payer: Self-pay | Admitting: Emergency Medicine

## 2016-02-23 ENCOUNTER — Ambulatory Visit: Payer: Medicare Other | Admitting: Physical Therapy

## 2016-02-23 DIAGNOSIS — G8929 Other chronic pain: Secondary | ICD-10-CM

## 2016-02-23 DIAGNOSIS — M25611 Stiffness of right shoulder, not elsewhere classified: Secondary | ICD-10-CM | POA: Diagnosis not present

## 2016-02-23 DIAGNOSIS — R293 Abnormal posture: Secondary | ICD-10-CM | POA: Diagnosis not present

## 2016-02-23 DIAGNOSIS — M6281 Muscle weakness (generalized): Secondary | ICD-10-CM | POA: Diagnosis not present

## 2016-02-23 DIAGNOSIS — M25511 Pain in right shoulder: Secondary | ICD-10-CM

## 2016-02-23 NOTE — Telephone Encounter (Signed)
Relation to [id number] Call back number: [phone]    Reason for call:  Patient received a bill from [facility]. Medicare advised resubmit coding stating "labs are medically necessary" instead of "routine", date of service 01/22/16. Advised patient office manager is out of the office and will follow up Wednesday the latest, please advise

## 2016-02-23 NOTE — Therapy (Signed)
[facility] [address]  [place], [place], [address] Phone: [phone]   Fax:  [phone]  Physical Therapy Treatment  Patient Details  Name: Nicole Brennan MRN: [id number] Date of Birth: 11-Jun-1940 Referring Provider: Dr. Lamar Blinks  Encounter Date: 02/23/2016      PT End of Session - 02/23/16 0934    Visit Number 7   Number of Visits 16   Date for PT Re-Evaluation 03/25/16   PT Start Time 0933   PT Stop Time 1013   PT Time Calculation (min) 40 min   Activity Tolerance Patient tolerated treatment well   Behavior During Therapy [facility] for tasks assessed/performed      Past Medical History:  Diagnosis Date  . Allergy   . Arthritis   . Cataract   . Thyroid disease     Past Surgical History:  Procedure Laterality Date  . DG  BONE DENSITY ([facility] HX)    . KNEE SURGERY    . TUBAL LIGATION      There were no vitals filed for this visit.      Subjective Assessment - 02/23/16 0933    Subjective Patient remaining busy with work - shoulder feels "really good)   Patient Stated Goals wants to regian use of R arm, reduce pain   Currently in Pain? No/denies   Pain Score 0-No pain                         [facility] Adult PT Treatment/Exercise - 02/23/16 0935      Shoulder Exercises: Seated   Flexion AROM;Strengthening;Right;15 reps;Weights   Flexion Weight (lbs) 2     Shoulder Exercises: Sidelying   External Rotation Strengthening;Right;15 reps;Weights   External Rotation Weight (lbs) 4   ABduction Strengthening;Right;15 reps;Weights   ABduction Weight (lbs) 4     Shoulder Exercises: Standing   External Rotation Strengthening;Right;15 reps;Theraband   Theraband Level (Shoulder External Rotation) Level 3 (Green)  in Marketing executive Strengthening;Right;15 reps;Theraband   Theraband Level (Shoulder Internal Rotation) Level 3 (Green)   Internal Rotation Limitations in doorway    Extension Strengthening;Both;15 reps;Theraband   Theraband Level (Shoulder Extension) Level 3 (Green)   Extension Limitations with scap squeeze   Row Strengthening;Both;15 reps;Theraband   Theraband Level (Shoulder Row) Level 3 (Green)     Shoulder Exercises: ROM/Strengthening   UBE (Upper Arm Bike) level 3.5 x 6 minutes (3/3)   Rhythmic Stabilization, Supine Therapist resisted 3 x 30 seconds                  PT Short Term Goals - 02/07/16 1328      PT SHORT TERM GOAL #1   Title patient to be independent with HEP (02/26/16)   Status On-going     PT SHORT TERM GOAL #2   Title Patient to demonstrate PROM of R shoulder equal to that of L shoulder (02/26/16)   Status On-going           PT Long Term Goals - 02/07/16 1329      PT LONG TERM GOAL #1   Title Patient to be independent with advanced HEP (03/25/16)   Status On-going     PT LONG TERM GOAL #2   Title Patient to improve R shoulder AROM equal to that of L shoulder for improved functional use of R UE (03/25/16)   Status On-going     PT LONG TERM  GOAL #3   Title Patient to improve R shoulder strength to >/= 4+/5 with no increase in pain (03/25/16)   Status On-going     PT LONG TERM GOAL #4   Title Patient to verbalize as well as demonstrate ability to self correct posture for reduce stress placed on neck/shoulder musculature for improved shoulder biomechanics (03/25/16)   Status On-going     PT LONG TERM GOAL #5   Title Patient to report ability to wash hair, lift small items above head (1-2#), as well as clasp bra demonstrating improved functional use of R UE (03/25/16)   Status On-going               Plan - 02/23/16 0935    Clinical Impression Statement Patient continues to make good progress with PROM as well as AROM with resistance. Near full AROM of forward flexion with 2#, however some pain/tightness at end range as reported by patient. Patient doing well with RTC and periscapular strengthening  with appropriate muscle fatigue. Patient to continue to benefit form PT to maximize function and improve functional ufe of R UE.    PT Treatment/Interventions ADLs/Self Care Home Management;Cryotherapy;Electrical Stimulation;Iontophoresis 4mg /ml Dexamethasone;Moist Heat;Ultrasound;Therapeutic exercise;Therapeutic activities;Patient/family education;Manual techniques;Passive range of motion;Vasopneumatic Device;Taping;Dry needling   PT Next Visit Plan progress ROM/scapular strength   Consulted and Agree with Plan of Care Patient      Patient will benefit from skilled therapeutic intervention in order to improve the following deficits and impairments:  Decreased activity tolerance, Decreased range of motion, Decreased strength, Impaired UE functional use, Pain  Visit Diagnosis: Stiffness of right shoulder, not elsewhere classified  Chronic right shoulder pain  Muscle weakness (generalized)  Abnormal posture     Problem List Patient Active Problem List   Diagnosis Date Noted  . Seasonal allergies 04/21/2012  . Cataract 04/21/2012  . Osteoporosis 05/01/2011  . Hypothyroidism 04/30/2011  . Osteoarthritis 04/30/2011      [name], PT, DPT 02/23/16 10:15 AM    [facility] [address]  [place], [place], [address] Phone: [phone]   Fax:  [phone]  Name: Nicole Brennan MRN: [id number] Date of Birth: Feb 04, 1941

## 2016-02-26 NOTE — Telephone Encounter (Signed)
I talked to the patient and informed her I did not see any outstanding charges in her account at this time. I told her to call me back with the account number as she was traveling for work at the time of the call. Patient stated she would call back tomorrow with account number

## 2016-02-27 NOTE — Telephone Encounter (Signed)
Patient called to with account number [id number] will check with coding to see why the $25 was remain

## 2016-03-01 ENCOUNTER — Ambulatory Visit: Payer: Medicare Other | Admitting: Physical Therapy

## 2016-03-01 DIAGNOSIS — G8929 Other chronic pain: Secondary | ICD-10-CM | POA: Diagnosis not present

## 2016-03-01 DIAGNOSIS — M6281 Muscle weakness (generalized): Secondary | ICD-10-CM | POA: Diagnosis not present

## 2016-03-01 DIAGNOSIS — M25611 Stiffness of right shoulder, not elsewhere classified: Secondary | ICD-10-CM

## 2016-03-01 DIAGNOSIS — R293 Abnormal posture: Secondary | ICD-10-CM

## 2016-03-01 DIAGNOSIS — M25511 Pain in right shoulder: Secondary | ICD-10-CM

## 2016-03-01 NOTE — Therapy (Addendum)
[facility] [address]  [place], [place], [address] Phone: [phone]   Fax:  [phone]  Physical Therapy Treatment  Patient Details  Name: Nicole Brennan MRN: [id number] Date of Birth: 04/16/1940 Referring Provider: Dr. Lamar Blinks  Encounter Date: 03/01/2016      PT End of Session - 03/01/16 0930    Visit Number 8   Number of Visits 16   Date for PT Re-Evaluation 03/25/16   PT Start Time 0930   PT Stop Time 1016   PT Time Calculation (min) 46 min   Activity Tolerance Patient tolerated treatment well   Behavior During Therapy [facility] for tasks assessed/performed      Past Medical History:  Diagnosis Date  . Allergy   . Arthritis   . Cataract   . Thyroid disease     Past Surgical History:  Procedure Laterality Date  . DG  BONE DENSITY ([facility] HX)    . KNEE SURGERY    . TUBAL LIGATION      There were no vitals filed for this visit.      Subjective Assessment - 03/01/16 0932    Subjective Patient doing well - feels like she is more stiff due to being stressed and not resting much   Patient Stated Goals wants to regian use of R arm, reduce pain   Currently in Pain? No/denies   Pain Score 0-No pain            [facility] PT Assessment - 03/01/16 0001      AROM   Right Shoulder Flexion 158 Degrees   Right Shoulder ABduction 156 Degrees                     [facility] Adult PT Treatment/Exercise - 03/01/16 0001      Shoulder Exercises: Seated   Horizontal ABduction Strengthening;Both;15 reps;Theraband   Theraband Level (Shoulder Horizontal ABduction) Level 3 (Green)   Horizontal ABduction Limitations with scap squeeze   External Rotation Strengthening;Both;15 reps;Theraband   Theraband Level (Shoulder External Rotation) Level 3 (Green)   External Rotation Limitations with scap squeeze     Shoulder Exercises: Standing   External Rotation Strengthening;Right;15 reps;Theraband   Theraband Level (Shoulder External Rotation) Level 3 (Green)   Internal Rotation Strengthening;Right;15 reps;Theraband   Theraband Level (Shoulder Internal Rotation) Level 3 (Green)   Extension Strengthening;Both;15 reps;Theraband   Theraband Level (Shoulder Extension) Level 3 (Green)   Extension Limitations with scap squeeze   Row Strengthening;Both;15 reps;Theraband   Theraband Level (Shoulder Row) Level 3 (Green)     Shoulder Exercises: ROM/Strengthening   UBE (Upper Arm Bike) level 3.5 x 6 minutes (3/3)     Manual Therapy   Manual Therapy Passive ROM   Passive ROM R shoulder - flexion, abduction, ER, and IR - 10 reps with 5-10 second hold at end range                   PT Short Term Goals - 03/01/16 1123      PT SHORT TERM GOAL #1   Title patient to be independent with HEP (02/26/16)   Status Achieved     PT SHORT TERM GOAL #2   Title Patient to demonstrate PROM of R shoulder equal to that of L shoulder (02/26/16)   Status Partially Met  near full flexion and ER; continued mild limitation of abduction and IR           PT  Long Term Goals - 03/01/16 1123      PT LONG TERM GOAL #1   Title Patient to be independent with advanced HEP (03/25/16)   Status On-going     PT LONG TERM GOAL #2   Title Patient to improve R shoulder AROM equal to that of L shoulder for improved functional use of R UE (03/25/16)   Status On-going     PT LONG TERM GOAL #3   Title Patient to improve R shoulder strength to >/= 4+/5 with no increase in pain (03/25/16)   Status On-going     PT LONG TERM GOAL #4   Title Patient to verbalize as well as demonstrate ability to self correct posture for reduce stress placed on neck/shoulder musculature for improved shoulder biomechanics (03/25/16)   Status On-going     PT LONG TERM GOAL #5   Title Patient to report ability to wash hair, lift small items above head (1-2#), as well as clasp bra demonstrating improved functional use of R UE  (03/25/16)   Status On-going               Plan - 03/01/16 0932    Clinical Impression Statement Patient conitnuing to do well - however with some increased tightness which patient attributes to increased activity with little time for rest. Patient with some conintued pain with abduction and IR at end range, with pain quickly resolving. Patient reporting some compliance with HEP and will continue to benefit from PT to maximize function.    PT Treatment/Interventions ADLs/Self Care Home Management;Cryotherapy;Electrical Stimulation;Iontophoresis 89m/ml Dexamethasone;Moist Heat;Ultrasound;Therapeutic exercise;Therapeutic activities;Patient/family education;Manual techniques;Passive range of motion;Vasopneumatic Device;Taping;Dry needling   PT Next Visit Plan progress ROM/scapular strength   Consulted and Agree with Plan of Care Patient      Patient will benefit from skilled therapeutic intervention in order to improve the following deficits and impairments:  Decreased activity tolerance, Decreased range of motion, Decreased strength, Impaired UE functional use, Pain  Visit Diagnosis: Stiffness of right shoulder, not elsewhere classified  Chronic right shoulder pain  Muscle weakness (generalized)  Abnormal posture           G-Codes - 12017/12/201708    Functional Assessment Tool Used FOTO: 56 (44% limited)   Functional Limitation Carrying, moving and handling objects   Carrying, Moving and Handling Objects Goal Status (([id number] At least 20 percent but less than 40 percent impaired, limited or restricted   Carrying, Moving and Handling Objects Discharge Status [phone] At least 40 percent but less than 60 percent impaired, limited or restricted     Problem List Patient Active Problem List   Diagnosis Date Noted  . Seasonal allergies 04/21/2012  . Cataract 04/21/2012  . Osteoporosis 05/01/2011  . Hypothyroidism 04/30/2011  . Osteoarthritis 04/30/2011     SLanney Gins PT, DPT 03/01/16 11:26 AM  PHYSICAL THERAPY DISCHARGE SUMMARY  Visits from Start of Care: 8  Current functional level related to goals / functional outcomes: See above; improvement of range and strength   Remaining deficits: See above; some continued pain and tightness   Education / Equipment: HEP  Plan: Patient agrees to discharge.  Patient goals were partially met. Patient is being discharged due to being pleased with the current functional level.  ?????    Patient not returning to PT after last visit as patient felt she had progressed well and symptoms have ben reduced with some return of normal ROM. PT happy to see patient in the future for any other  PT needs!  [name], PT, DPT 03/29/16 3:17 PM    [facility] [address]  [place], [place], [address] Phone: [phone]   Fax:  [phone]  Name: LISAANNE LAWRIE MRN: [id number] Date of Birth: 07-19-1940

## 2016-03-05 ENCOUNTER — Ambulatory Visit (INDEPENDENT_AMBULATORY_CARE_PROVIDER_SITE_OTHER): Payer: Medicare Other | Admitting: Medical

## 2016-03-05 ENCOUNTER — Encounter: Payer: Self-pay | Admitting: Medical

## 2016-03-05 VITALS — BP 124/53 | HR 64 | Temp 97.7°F | Ht 65.0 in | Wt 188.0 lb

## 2016-03-05 DIAGNOSIS — J029 Acute pharyngitis, unspecified: Secondary | ICD-10-CM

## 2016-03-05 DIAGNOSIS — R05 Cough: Secondary | ICD-10-CM

## 2016-03-05 DIAGNOSIS — R059 Cough, unspecified: Secondary | ICD-10-CM

## 2016-03-05 LAB — POCT RAPID STREP A (OFFICE): Rapid Strep A Screen: NEGATIVE

## 2016-03-05 MED ORDER — AZITHROMYCIN 250 MG PO TABS: ORAL_TABLET | ORAL | 0 refills | Status: DC

## 2016-03-05 MED ORDER — HYDROCODONE-HOMATROPINE 5-1.5 MG/5ML PO SYRP: 5.0000 mL | ORAL_SOLUTION | Freq: Three times a day (TID) | ORAL | 0 refills | Status: DC | PRN

## 2016-03-05 NOTE — Patient Instructions (Signed)
You rapid strep test was negative. You may have early viral pharyngitis(some recent laryngitis as well).  For your recent associated cough will rx hycodan. For mild nasal congestion can use your fluticiasone.  Since heading out of town and sometimes false negative results and rapid strep testing will rx azithromycin today. Can start for worsening st, sinus infection symptoms or bronchitis. Would recommend filling med before leaving out of town.  Follow up in 7 days or as needed

## 2016-03-05 NOTE — Progress Notes (Signed)
Subjective:    Patient ID: Nicole Brennan, female    DOB: 1941-01-16, 75 y.o.   MRN: [id number]  HPI   Pt in with some recent st x 4 days. Pt states she ha history of overuse of her voice this time of season. Pt recently playing a lot of piano 4 hours a day. Pt states pretty busy.  Pt has some cough recent with some pnd. She reports some fatigue.   Pt concerned since heading out of town and does not want to get worse. She questions if antibiotic may be needed.      Review of Systems  Constitutional: Negative for chills, fatigue and fever.  HENT: Positive for congestion, sore throat and voice change. Negative for ear pain, nosebleeds, postnasal drip, sinus pain and sinus pressure.        Mild hoarse voice recently last week.  Respiratory: Positive for cough. Negative for chest tightness, shortness of breath and wheezing.   Cardiovascular: Negative for chest pain and palpitations.  Gastrointestinal: Negative for abdominal pain.  Musculoskeletal: Negative for back pain, joint swelling, myalgias, neck pain and neck stiffness.  Skin: Negative for rash.  Neurological: Negative for dizziness, weakness and headaches.  Hematological: Negative for adenopathy. Does not bruise/bleed easily.  Psychiatric/Behavioral: Negative for behavioral problems, confusion, self-injury and suicidal ideas. The patient is not nervous/anxious.     Past Medical History:  Diagnosis Date  . Allergy   . Arthritis   . Cataract   . Thyroid disease      Social History   Social History  . Marital status: Married    Spouse name: N/A  . Number of children: N/A  . Years of education: N/A   Occupational History  . Not on file.   Social History Main Topics  . Smoking status: Never Smoker  . Smokeless tobacco: Never Used  . Alcohol use No  . Drug use: No  . Sexual activity: Not on file   Other Topics Concern  . Not on file   Social History Narrative  . No narrative on file    Past Surgical  History:  Procedure Laterality Date  . DG  BONE DENSITY ([facility] HX)    . KNEE SURGERY    . TUBAL LIGATION      Family History  Problem Relation Age of Onset  . Cancer Mother   . Vascular Disease Mother   . Dementia Father     Allergies  Allergen Reactions  . Penicillins Hives    Current Outpatient Prescriptions on File Prior to Visit  Medication Sig Dispense Refill  . calcium carbonate 200 MG capsule Take 250 mg by mouth 2 (two) times daily with a meal.    . Carboxymethylcellulose Sodium (THERATEARS OP) Apply to eye.    . levothyroxine (SYNTHROID, LEVOTHROID) 75 MCG tablet Take 1 tablet daily 90 tablet 3  . Multiple Vitamins-Minerals (MULTIVITAMIN WITH MINERALS) tablet Take 1 tablet by mouth daily.     No current facility-administered medications on file prior to visit.     BP (!) 124/53 (BP Location: Left Arm, Patient Position: Sitting, Cuff Size: Large)   Pulse 64   Temp 97.7 F (36.5 C) (Oral)   Ht 5\' 5"  (1.651 m)   Wt 188 lb (85.3 kg)   SpO2 100%   BMI 31.28 kg/m       Objective:   Physical Exam  General  Mental Status - Alert. General Appearance - Well groomed. Not in acute distress.  Skin Rashes-  No Rashes.  HEENT Head- Normal. Ear Auditory Canal - Left- Normal. Right - Normal.Tympanic Membrane- Left- Normal. Right- Normal. Eye Sclera/Conjunctiva- Left- Normal. Right- Normal. Nose & Sinuses Nasal Mucosa- Left-  Boggy and Congested. Right-  Boggy and  Congested.Bilateral maxillary and frontal sinus pressure. Mouth & Throat Lips: Upper Lip- Normal: no dryness, cracking, pallor, cyanosis, or vesicular eruption. Lower Lip-Normal: no dryness, cracking, pallor, cyanosis or vesicular eruption. Buccal Mucosa- Bilateral- No Aphthous ulcers. Oropharynx- No Discharge or Erythema. Tonsils: Characteristics- Bilateral- faint Erythema or Congestion. Size/Enlargement- Bilateral- No enlargement. Discharge- bilateral-None.  Neck Neck- Supple. No Masses.   Chest  and Lung Exam Auscultation: Breath Sounds:-Clear even and unlabored.  Cardiovascular Auscultation:Rythm- Regular, rate and rhythm. Murmurs & Other Heart Sounds:Ausculatation of the heart reveal- No Murmurs.  Lymphatic Head & Neck General Head & Neck Lymphatics: Bilateral: Description- No Localized lymphadenopathy.       Assessment & Plan:  You rapid strep test was negative. You may have early viral pharyngitis(some recent laryngitis as well).  For your recent associated cough will rx hycodan. For mild nasal congestion can use your fluticiasone.  Since heading out of town and sometimes false negative results and rapid strep testing will rx azithromycin today. Can start for worsening st, sinus infection symptoms or bronchitis. Would recommend filling med before leaving out of town.  Follow up in 7 days or as needed  Thompson Mckim, Percell Miller, [employer]

## 2016-03-05 NOTE — Progress Notes (Signed)
Pre visit review using our clinic review tool, if applicable. No additional management support is needed unless otherwise documented below in the visit note. 

## 2016-03-15 ENCOUNTER — Telehealth: Payer: Self-pay | Admitting: Family Medicine

## 2016-03-15 NOTE — Telephone Encounter (Signed)
Caller name: Relationship to patient: Self Can be reached: [phone] Pharmacy:  Reason for call: Patient states that she received a message on My Chart that she needs a Pneumonia shot. Thinks she has had one. Plse adv

## 2016-03-18 NOTE — Telephone Encounter (Signed)
Called her and [facility]- she had pneumovax in 2005- she would have been under 66 at that time.  She did have prenvar 13 in 2015.  She should have a repeat pneumovax as she had this under age 76- can do the next time we see her, no need to make a special trip

## 2016-03-29 ENCOUNTER — Ambulatory Visit (INDEPENDENT_AMBULATORY_CARE_PROVIDER_SITE_OTHER): Payer: Medicare Other | Admitting: Medical

## 2016-03-29 ENCOUNTER — Encounter: Payer: Self-pay | Admitting: Medical

## 2016-03-29 VITALS — BP 142/64 | HR 71 | Temp 97.9°F | Resp 16 | Ht 65.0 in | Wt 187.5 lb

## 2016-03-29 DIAGNOSIS — R059 Cough, unspecified: Secondary | ICD-10-CM

## 2016-03-29 DIAGNOSIS — J3489 Other specified disorders of nose and nasal sinuses: Secondary | ICD-10-CM

## 2016-03-29 DIAGNOSIS — J069 Acute upper respiratory infection, unspecified: Secondary | ICD-10-CM

## 2016-03-29 DIAGNOSIS — R05 Cough: Secondary | ICD-10-CM

## 2016-03-29 MED ORDER — DOXYCYCLINE HYCLATE 100 MG PO TABS: 100.0000 mg | ORAL_TABLET | Freq: Two times a day (BID) | ORAL | 0 refills | Status: DC

## 2016-03-29 MED ORDER — HYDROCODONE-HOMATROPINE 5-1.5 MG/5ML PO SYRP: 5.0000 mL | ORAL_SOLUTION | Freq: Three times a day (TID) | ORAL | 0 refills | Status: DC | PRN

## 2016-03-29 MED FILL — DOXYCYCLINE HYCLATE 100 MG: 100 | 10 days supply | Qty: 20 | Fill #0

## 2016-03-29 MED FILL — HYDROCODONE-HOMATROPINE SYR: 5-1.5 | 8 days supply | Qty: 120 | Fill #0

## 2016-03-29 NOTE — Progress Notes (Signed)
Pre visit review using our clinic review tool, if applicable. No additional management support is needed unless otherwise documented below in the visit note/SLS  

## 2016-03-29 NOTE — Patient Instructions (Signed)
URI vs sinus infection.  Rest hydrate and tylenol for fever.  For nasal congestion use flonase.  For cough refill you hycodan.  If sinus pressure or chest congestion worsens then start doxycyline.   Follow up in 7-10 days or as needed

## 2016-03-29 NOTE — Progress Notes (Signed)
Subjective:    Patient ID: Nicole Brennan, female    DOB: 1940-08-03, 76 y.o.   MRN: [id number]  HPI  Pt in states some sinus pressure, chest congestion and ear pressure. Pt states she had this over past 4-5 days.(But she feels like may have never gotten over her last illness completely)  Pt states sinus pressure is worse now than in past. Last visit had more st complaint.  Pt husband is sick.   Pt has no fever, no chills, no sweats or body aches.  Pt seen at end of December was sick but different presentation on review of that note.   Review of Systems  Constitutional: Negative for chills, fatigue and fever.  HENT: Positive for congestion, postnasal drip, sinus pain and sinus pressure. Negative for sore throat and trouble swallowing.   Respiratory: Positive for cough. Negative for chest tightness, shortness of breath and wheezing.   Cardiovascular: Negative for chest pain and palpitations.  Gastrointestinal: Negative for abdominal pain.  Musculoskeletal: Negative for back pain and joint swelling.  Neurological: Negative for dizziness and headaches.  Hematological: Negative for adenopathy. Does not bruise/bleed easily.  Psychiatric/Behavioral: Negative for behavioral problems.   Past Medical History:  Diagnosis Date  . Allergy   . Arthritis   . Cataract   . Thyroid disease      Social History   Social History  . Marital status: Married    Spouse name: N/A  . Number of children: N/A  . Years of education: N/A   Occupational History  . Not on file.   Social History Main Topics  . Smoking status: Never Smoker  . Smokeless tobacco: Never Used  . Alcohol use No  . Drug use: No  . Sexual activity: Not on file   Other Topics Concern  . Not on file   Social History Narrative  . No narrative on file    Past Surgical History:  Procedure Laterality Date  . DG  BONE DENSITY ([facility] HX)    . KNEE SURGERY    . TUBAL LIGATION      Family History  Problem Relation  Age of Onset  . Cancer Mother   . Vascular Disease Mother   . Dementia Father     Allergies  Allergen Reactions  . Penicillins Hives    Current Outpatient Prescriptions on File Prior to Visit  Medication Sig Dispense Refill  . calcium carbonate 200 MG capsule Take 250 mg by mouth 2 (two) times daily with a meal.    . Carboxymethylcellulose Sodium (THERATEARS OP) Apply to eye.    . levothyroxine (SYNTHROID, LEVOTHROID) 75 MCG tablet Take 1 tablet daily 90 tablet 3  . Multiple Vitamins-Minerals (MULTIVITAMIN WITH MINERALS) tablet Take 1 tablet by mouth daily.    Marland Kitchen HYDROcodone-homatropine (HYCODAN) 5-1.5 MG/5ML syrup Take 5 mLs by mouth every 8 (eight) hours as needed for cough. (Patient not taking: Reported on 03/29/2016) 120 mL 0   No current facility-administered medications on file prior to visit.     BP (!) 142/64 (BP Location: Left Arm, Patient Position: Sitting, Cuff Size: Large)   Pulse 71   Temp 97.9 F (36.6 C) (Oral)   Resp 16   Ht 5\' 5"  (1.651 m)   Wt 187 lb 8 oz (85 kg)   SpO2 99%   BMI 31.20 kg/m       Objective:   Physical Exam  General  Mental Status - Alert. General Appearance - Well groomed. Not in acute distress.  Nasal congested  Skin Rashes- No Rashes.  HEENT Head- Normal. Ear Auditory Canal - Left- Normal. Right - Normal.Tympanic Membrane- Left- Normal. Right- Normal. Eye Sclera/Conjunctiva- Left- Normal. Right- Normal. Nose & Sinuses Nasal Mucosa- Left-  Boggy and Congested. Right-  Boggy and  Congested.Bilateral  No maxillary but  No frontal sinus pressure. Mouth & Throat Lips: Upper Lip- Normal: no dryness, cracking, pallor, cyanosis, or vesicular eruption. Lower Lip-Normal: no dryness, cracking, pallor, cyanosis or vesicular eruption. Buccal Mucosa- Bilateral- No Aphthous ulcers. Oropharynx- No Discharge or Erythema. Tonsils: Characteristics- Bilateral- No Erythema or Congestion. Size/Enlargement- Bilateral- No enlargement. Discharge-  bilateral-None.  Neck Neck- Supple. No Masses.   Chest and Lung Exam Auscultation: Breath Sounds:-Clear even and unlabored.  Cardiovascular Auscultation:Rythm- Regular, rate and rhythm. Murmurs & Other Heart Sounds:Ausculatation of the heart reveal- No Murmurs.  Lymphatic Head & Neck General Head & Neck Lymphatics: Bilateral: Description- No Localized lymphadenopathy.       Assessment & Plan:  URI vs sinus infection.  Rest hydrate and tylenol for fever.  For nasal congestion use flonase.  For cough refill you hycodan.  If sinus pressure or chest congestion worsens then start doxycyline.   Follow up in 7-10 days or as needed

## 2016-03-31 ENCOUNTER — Emergency Department (HOSPITAL_BASED_OUTPATIENT_CLINIC_OR_DEPARTMENT_OTHER)
Admission: EM | Admit: 2016-03-31 | Discharge: 2016-03-31 | Disposition: A | Discharge status: Home / Self Care | Payer: Medicare Other | Attending: Emergency Medicine | Admitting: Emergency Medicine

## 2016-03-31 ENCOUNTER — Emergency Department (HOSPITAL_BASED_OUTPATIENT_CLINIC_OR_DEPARTMENT_OTHER): Payer: Medicare Other

## 2016-03-31 ENCOUNTER — Encounter (HOSPITAL_BASED_OUTPATIENT_CLINIC_OR_DEPARTMENT_OTHER): Payer: Self-pay | Admitting: Emergency Medicine

## 2016-03-31 DIAGNOSIS — J069 Acute upper respiratory infection, unspecified: Secondary | ICD-10-CM | POA: Diagnosis not present

## 2016-03-31 DIAGNOSIS — R05 Cough: Secondary | ICD-10-CM | POA: Diagnosis not present

## 2016-03-31 DIAGNOSIS — Z79899 Other long term (current) drug therapy: Secondary | ICD-10-CM | POA: Insufficient documentation

## 2016-03-31 DIAGNOSIS — E039 Hypothyroidism, unspecified: Secondary | ICD-10-CM | POA: Diagnosis not present

## 2016-03-31 DIAGNOSIS — M6281 Muscle weakness (generalized): Secondary | ICD-10-CM | POA: Diagnosis not present

## 2016-03-31 NOTE — ED Provider Notes (Signed)
[facility] Provider Note   CSN: [id number] Arrival date & time: 03/31/16  1258  By signing my name below, I, Reola Mosher, attest that this documentation has been prepared under the direction and in the presence of Malvin Johns, MD. Electronically Signed: Reola Mosher, ED Scribe. 03/31/16. 3:17 PM.  History   Chief Complaint Chief Complaint  Patient presents with  . URI    seen friday at PCP for same, on abx   The history is provided by the patient and medical records. No language interpreter was used.    HPI Comments: Nicole Brennan is a 76 y.o. female with a h/o hypothyroidism and seasonal allergies, who presents to the Emergency Department complaining of gradually worsening, persistent productive cough onset approximately five days ago.  She reports associated congestion, mild generalized myalgias, rhinorrhea, and frontal headache localized around her eyes secondary to her cough. Per prior chart review, pt was seen for her symptoms by her PCP on 03/29/16 (~3 days ago), and at that time she was dx'd w/ likely URI vs sinus infection. Pt was prescribed Doxycycline and Hycodan at that time which she has been taking without significant relief of her symptoms. Pt has additionally been taking Mucinex and Tylenol at home without relief of her symptoms as well. Her cough is exacerbated with mild exertion. Pt notes that prior to the onset of her current symptoms that she was sick three weeks ago with similar symptoms. She was dx'd w/ viral pharyngitis at that time. Her symptoms from this episode resolved following symptomatic treatment; however, her symptoms returned five days ago. Her husband has been sick recently with similar symptoms. Pt did receive her yearly influenza vaccination this season. She denies fever, nausea, vomiting, leg swelling, abdominal pain, or any other associated symptoms.   PCP: Mackie Pai, PA-C working with Silvestre Mesi, MD  Past Medical  History:  Diagnosis Date  . Allergy   . Arthritis   . Cataract   . Thyroid disease    Patient Active Problem List   Diagnosis Date Noted  . Seasonal allergies 04/21/2012  . Cataract 04/21/2012  . Osteoporosis 05/01/2011  . Hypothyroidism 04/30/2011  . Osteoarthritis 04/30/2011   Past Surgical History:  Procedure Laterality Date  . DG  BONE DENSITY ([place] HX)    . KNEE SURGERY    . TUBAL LIGATION     OB History    No data available     Home Medications    Prior to Admission medications   Medication Sig Start Date End Date Taking? Authorizing Provider  doxycycline (VIBRA-TABS) 100 MG tablet Take 1 tablet (100 mg total) by mouth 2 (two) times daily. 03/29/16  Yes Edward Saguier, PA-C  HYDROcodone-homatropine (HYCODAN) 5-1.5 MG/5ML syrup Take 5 mLs by mouth every 8 (eight) hours as needed for cough. 03/29/16  Yes Edward Saguier, PA-C  levothyroxine (SYNTHROID, LEVOTHROID) 75 MCG tablet Take 1 tablet daily 01/22/16  Yes Gay Filler Copland, MD  Multiple Vitamins-Minerals (MULTIVITAMIN WITH MINERALS) tablet Take 1 tablet by mouth daily.   Yes Historical Provider, MD  calcium carbonate 200 MG capsule Take 250 mg by mouth 2 (two) times daily with a meal.    Historical Provider, MD  Carboxymethylcellulose Sodium (THERATEARS OP) Apply to eye.    Historical Provider, MD  HYDROcodone-homatropine (HYCODAN) 5-1.5 MG/5ML syrup Take 5 mLs by mouth every 8 (eight) hours as needed for cough. 03/05/16   Mackie Pai, PA-C   Family History Family History  Problem Relation Age  of Onset  . Cancer Mother   . Vascular Disease Mother   . Dementia Father    Social History Social History  Substance Use Topics  . Smoking status: Never Smoker  . Smokeless tobacco: Never Used  . Alcohol use No   Allergies   Penicillins  Review of Systems Review of Systems  Constitutional: Negative for fever.  HENT: Positive for congestion and rhinorrhea.   Respiratory: Positive for cough.   Cardiovascular:  Negative for leg swelling.  Gastrointestinal: Negative for abdominal pain, nausea and vomiting.  Musculoskeletal: Positive for myalgias (generalized).  Neurological: Positive for headaches.  All other systems reviewed and are negative.  Physical Exam Updated Vital Signs BP 145/72 (BP Location: Right Arm)   Pulse 93   Temp 97.5 F (36.4 C) (Oral)   Resp 20   Ht 5' 5.5" (1.664 m)   Wt 187 lb (84.8 kg)   SpO2 97%   BMI 30.65 kg/m   Physical Exam  Constitutional: She is oriented to person, place, and time. She appears well-developed and well-nourished.  HENT:  Head: Normocephalic and atraumatic.  Right Ear: External ear normal.  Left Ear: External ear normal.  Mouth/Throat: Oropharynx is clear and moist.  Eyes: Pupils are equal, round, and reactive to light.  Neck: Normal range of motion. Neck supple.  Cardiovascular: Normal rate, regular rhythm and normal heart sounds.   Pulmonary/Chest: Effort normal and breath sounds normal. No respiratory distress. She has no wheezes. She has no rales. She exhibits no tenderness.  Abdominal: Soft. Bowel sounds are normal. There is no tenderness. There is no rebound and no guarding.  Musculoskeletal: Normal range of motion. She exhibits no edema.  Lymphadenopathy:    She has no cervical adenopathy.  Neurological: She is alert and oriented to person, place, and time.  Skin: Skin is warm and dry. No rash noted.  Psychiatric: She has a normal mood and affect.   ED Treatments / Results  DIAGNOSTIC STUDIES: Oxygen Saturation is 97% on RA, normal by my interpretation.   COORDINATION OF CARE: 3:17 PM-Discussed next steps with pt including CXR. Pt verbalized understanding and is agreeable with the plan.   Labs (all labs ordered are listed, but only abnormal results are displayed) Labs Reviewed - No data to display  EKG  EKG Interpretation None      Radiology Dg Chest 2 View  Result Date: 03/31/2016 CLINICAL DATA:  Nonproductive  cough EXAM: CHEST  2 VIEW COMPARISON:  03/01/2015 FINDINGS: Normal heart size. There is no pleural effusion or edema. No airspace opacities identified. Spondylosis identified within the thoracic spine. Chronic right posterolateral rib fracture. IMPRESSION: No active cardiopulmonary disease. Electronically Signed   By: Kerby Moors M.D.   On: 03/31/2016 14:34   Procedures Procedures   Medications Ordered in ED Medications - No data to display  Initial Impression / Assessment and Plan / ED Course  I have reviewed the triage vital signs and the nursing notes.  Pertinent labs & imaging results that were available during my care of the patient were reviewed by me and considered in my medical decision making (see chart for details).     Patient presents with viral URI symptoms. There is no evidence of pneumonia. Her lungs are clear. Her oxygen saturation is normal. She's otherwise well-appearing. She was discharged home good condition. She will continue the medications prescribed by her PCP 2 days ago. Return precautions were given.  Final Clinical Impressions(s) / ED Diagnoses   Final diagnoses:  Viral upper respiratory tract infection   New Prescriptions Discharge Medication List as of 03/31/2016  3:28 PM     I personally performed the services described in this documentation, which was scribed in my presence.  The recorded information has been reviewed and considered.     Malvin Johns, MD 03/31/16 1536

## 2016-03-31 NOTE — ED Triage Notes (Signed)
Patient reports she has been to see her PCP on Friday for cough, ongoing since around Christmas, was diagnosed with URI at that time. Patient c/o headache as well. Patient states she can't seem to get over this. Patient was given flonase, hycodan, & doxycycline, has not used the flonase at all.   Patient states she feels worse than she did Friday. Patient states the cough is non productive.

## 2016-03-31 NOTE — ED Notes (Signed)
Pt given d/c instructions as per chart. Verbalizes understanding. No questions. 

## 2016-04-16 ENCOUNTER — Other Ambulatory Visit: Payer: Self-pay | Admitting: Family Medicine

## 2016-04-16 DIAGNOSIS — Z1231 Encounter for screening mammogram for malignant neoplasm of breast: Secondary | ICD-10-CM

## 2016-05-01 DIAGNOSIS — H2513 Age-related nuclear cataract, bilateral: Secondary | ICD-10-CM | POA: Diagnosis not present

## 2016-05-06 ENCOUNTER — Ambulatory Visit (INDEPENDENT_AMBULATORY_CARE_PROVIDER_SITE_OTHER): Payer: Medicare Other | Admitting: Family Medicine

## 2016-05-06 VITALS — BP 110/72 | HR 84 | Temp 98.1°F | Ht 65.0 in | Wt 186.8 lb

## 2016-05-06 DIAGNOSIS — R05 Cough: Secondary | ICD-10-CM | POA: Diagnosis not present

## 2016-05-06 DIAGNOSIS — R059 Cough, unspecified: Secondary | ICD-10-CM

## 2016-05-06 MED ORDER — PREDNISONE 20 MG PO TABS: ORAL_TABLET | ORAL | 0 refills | Status: DC

## 2016-05-06 MED FILL — predniSONE 20 MG TABS: 20 | 6 days supply | Qty: 9 | Fill #0

## 2016-05-06 NOTE — Patient Instructions (Addendum)
We are going to try a short course of steroids for your cough Take the prednisone as directed - please let me know if your cough does not resolve following this treatment, Sooner if worse.

## 2016-05-06 NOTE — Progress Notes (Signed)
[place] [facility] [address] [place], [place] [address] [phone]  Date:  05/06/2016   Name:  Nicole Brennan   DOB:  [birth year]   MRN:  [id number]  PCP:  Lamar Blinks, MD    Chief Complaint: Cough (c/o dry cough since December. )   History of Present Illness:  Nicole Brennan is a 76 y.o. very pleasant female patient who presents with the following:  Was seen in this office for cough on 03-05-2016 and 03-29-2016 by Mackie Pai. ED visit on 03-31-2016.  Had about 1 week where her cough was leaving, then it came back.  Dry cough.  Very little nasal discharge.  Some sneezing off and on. Denies fever.  Tired at night.  No cough syrup seems to help.  Tussen, codeine cough syrup did not help.  Antibiotic she received in January (doxycycline), did not think that it helped.  When she gets to sleep, she will stay asleep, until she gets up to use the restroom, then starting coughing again. The cough feels like a spasm, like an irresistible urge to cough  The cough is dry. She is otherwise feeling well, not coughing up any blood or other material   Using regular lozenges. She is also using tessalon but it does not seem to help  Denies any changes to her bowel or bladder habits.  Denies SOB.  Feels like she struggles to get a breath sometimes.  Denies any CP palpitations.  Works Programmer, systems at retirement homes, her next [place] [employer] is @ 3pm at [employer]. No history of diabetes or hyperglycemia   Patient Active Problem List   Diagnosis Date Noted  . Seasonal allergies 04/21/2012  . Cataract 04/21/2012  . Osteoporosis 05/01/2011  . Hypothyroidism 04/30/2011  . Osteoarthritis 04/30/2011    Past Medical History:  Diagnosis Date  . Allergy   . Arthritis   . Cataract   . Thyroid disease     Past Surgical History:  Procedure Laterality Date  . DG  BONE DENSITY ([place] HX)    . KNEE SURGERY    . TUBAL LIGATION       Social History  Substance Use Topics  . Smoking status: Never Smoker  . Smokeless tobacco: Never Used  . Alcohol use No    Family History  Problem Relation Age of Onset  . Cancer Mother   . Vascular Disease Mother   . Dementia Father     Allergies  Allergen Reactions  . Penicillins Hives    Medication list has been reviewed and updated.  Current Outpatient Prescriptions on File Prior to Visit  Medication Sig Dispense Refill  . calcium carbonate 200 MG capsule Take 250 mg by mouth 2 (two) times daily with a meal.    . Carboxymethylcellulose Sodium (THERATEARS OP) Apply to eye.    Marland Kitchen HYDROcodone-homatropine (HYCODAN) 5-1.5 MG/5ML syrup Take 5 mLs by mouth every 8 (eight) hours as needed for cough. 120 mL 0  . levothyroxine (SYNTHROID, LEVOTHROID) 75 MCG tablet Take 1 tablet daily 90 tablet 3  . Multiple Vitamins-Minerals (MULTIVITAMIN WITH MINERALS) tablet Take 1 tablet by mouth daily.     No current facility-administered medications on file prior to visit.     Review of Systems:  ROS  No fever, chills, nausea, vomiting, CP, SOB   Physical Examination: Vitals:   05/06/16 1403  BP: 110/72  Pulse: 84  Temp: 98.1 F (36.7 C)   Vitals:  05/06/16 1403  Weight: 186 lb 12.8 oz (84.7 kg)  Height: 5\' 5"  (1.651 m)   Body mass index is 31.09 kg/m. Ideal Body Weight: Weight in (lb) to have BMI = 25: 149.9  GEN: WDWN, NAD, Non-toxic, A & O x 3, overweight, looks well,  Coughing some in room HEENT: Atraumatic, Normocephalic. Neck supple. No masses, No LAD.  Bilateral TM wnl, oropharynx normal.  PEERL,EOMI.   Ears and Nose: No external deformity. CV: RRR, No M/G/R. No JVD. No thrill. No extra heart sounds. PULM: CTA B, no wheezes, crackles, rhonchi. No retractions. No resp. distress. No accessory muscle use.  Benign lung exam EXTR: No c/c/e NEURO Normal gait.  PSYCH: Normally interactive. Conversant. Not depressed or anxious appearing.  Calm demeanor.     Assessment and Plan: Cough - Plan: predniSONE (DELTASONE) 20 MG tablet  Here today with a persistent, irritable cough for the last 2 months. Will try a short course of prednisone for her.  She will let me know if not helpful- Sooner if worse.  She has completed 2 courses of abx without improvement.   If cough continues consider spirometry, inhaled steroid   Signed Lamar Blinks, MD

## 2016-05-06 NOTE — Progress Notes (Signed)
Pre visit review using our clinic review tool, if applicable. No additional management support is needed unless otherwise documented below in the visit note. 

## 2016-05-15 DIAGNOSIS — H16221 Keratoconjunctivitis sicca, not specified as Sjogren's, right eye: Secondary | ICD-10-CM | POA: Diagnosis not present

## 2016-05-15 DIAGNOSIS — H16222 Keratoconjunctivitis sicca, not specified as Sjogren's, left eye: Secondary | ICD-10-CM | POA: Diagnosis not present

## 2016-05-24 ENCOUNTER — Ambulatory Visit
Admission: RE | Admit: 2016-05-24 | Discharge: 2016-05-24 | Disposition: A | Discharge status: Home / Self Care | Payer: Medicare Other | Source: Ambulatory Visit | Attending: Family Medicine | Admitting: Family Medicine

## 2016-05-24 DIAGNOSIS — Z1231 Encounter for screening mammogram for malignant neoplasm of breast: Secondary | ICD-10-CM | POA: Diagnosis not present

## 2016-06-05 ENCOUNTER — Telehealth: Payer: Self-pay | Admitting: *Deleted

## 2016-06-05 NOTE — Telephone Encounter (Signed)
AWV scheduled 07/01/16.

## 2016-06-26 NOTE — Progress Notes (Signed)
Pre visit review using our clinic review tool, if applicable. No additional management support is needed unless otherwise documented below in the visit note. 

## 2016-06-26 NOTE — Progress Notes (Addendum)
Subjective:   Nicole Brennan is a 76 y.o. female who presents for Medicare Annual (Subsequent) preventive examination.  Review of Systems:  No ROS.  Medicare Wellness Visit. Cardiac Risk Factors include: advanced age (>84men, >52 women) Sleep patterns: wakes 1-2x/urinate. Sleeps 8-9 hrs per night. Feels rested. Home Safety/Smoke Alarms:  Feels safe in home. Smoke alarms in place.  Living environment; residence and Firearm Safety: Lives at home with husband. No stairs. No guns. Seat Belt Safety/Bike Helmet: Wears seat belt.   Counseling:   Eye Exam- Wears glasses. Dr.Stonecipher- cataract sx bilateral scheduled for May 2018 Dental- Dr.Odeh every 6 months.  Female:   Pap-   No longer due to age per pt.  Mammo- Last 05/24/16: BI-RADS CATEGORY  1: Negative.      Dexa scan-  Last 07/27/14:   Normal   CCS- pt declines.    Objective:     Vitals: BP 132/68 (BP Location: Right Arm, Patient Position: Sitting, Cuff Size: Normal)   Pulse (!) 56   Ht 5\' 5"  (1.651 m)   Wt 185 lb (83.9 kg)   SpO2 98%   BMI 30.79 kg/m   Body mass index is 30.79 kg/m.   Tobacco History  Smoking Status  . Never Smoker  Smokeless Tobacco  . Never Used     Counseling given: Not Answered   Past Medical History:  Diagnosis Date  . Allergy   . Arthritis   . Cataract   . Thyroid disease    Past Surgical History:  Procedure Laterality Date  . DG  BONE DENSITY ([place] HX)    . KNEE SURGERY    . TUBAL LIGATION     Family History  Problem Relation Age of Onset  . Cancer Mother   . Vascular Disease Mother   . Dementia Father    History  Sexual Activity  . Sexual activity: Not on file    Outpatient Encounter Prescriptions as of 07/01/2016  Medication Sig  . calcium carbonate 200 MG capsule Take 250 mg by mouth 2 (two) times daily with a meal.  . Carboxymethylcellulose Sodium (THERATEARS OP) Apply to eye.  . Glycerin-Polysorbate 80 (REFRESH DRY EYE THERAPY OP) Apply 1 drop to eye as needed.  Marland Kitchen  levothyroxine (SYNTHROID, LEVOTHROID) 75 MCG tablet Take 1 tablet daily  . Multiple Vitamins-Minerals (MULTIVITAMIN WITH MINERALS) tablet Take 1 tablet by mouth daily.  . Turmeric 450 MG CAPS Take 1 capsule by mouth daily.  . [DISCONTINUED] HYDROcodone-homatropine (HYCODAN) 5-1.5 MG/5ML syrup Take 5 mLs by mouth every 8 (eight) hours as needed for cough.  . [DISCONTINUED] predniSONE (DELTASONE) 20 MG tablet Take 2 pills a day for 3 days, then 1 pill a day for 3 days if needed   No facility-administered encounter medications on file as of 07/01/2016.     Activities of Daily Living In your present state of health, do you have any difficulty performing the following activities: 07/01/2016  Hearing? N  Vision? N  Difficulty concentrating or making decisions? N  Walking or climbing stairs? N  Dressing or bathing? N  Doing errands, shopping? N  Preparing Food and eating ? N  Using the Toilet? N  In the past six months, have you accidently leaked urine? N  Do you have problems with loss of bowel control? N  Managing your Medications? N  Managing your Finances? N  Housekeeping or managing your Housekeeping? N  Some recent data might be hidden    Patient Care Team: Gay Filler  Copland, MD as PCP - General (Family Medicine)    Assessment:    Physical assessment deferred to PCP.  Exercise Activities and Dietary recommendations Current Exercise Habits: The patient does not participate in regular exercise at present (Stays busy per pt.), Exercise limited by: None identified   Diet (meal preparation, eat out, water intake, caffeinated beverages, dairy products, fruits and vegetables): well balanced, on average, 3 meals per day  Goals      Patient Stated   . Maintain current healthy lifestyle. (pt-stated)      Fall Risk Fall Risk  07/01/2016 09/04/2015 08/11/2015 03/21/2015 09/01/2014  Falls in the past year? No No No No No   Depression Screen PHQ 2/9 Scores 07/01/2016 09/04/2015 08/11/2015  03/21/2015  PHQ - 2 Score 0 0 0 0     Cognitive Function MMSE - Mini Mental State Exam 07/01/2016  Orientation to time 5  Orientation to Place 5  Registration 3  Attention/ Calculation 5  Recall 3  Language- name 2 objects 2  Language- repeat 1  Language- follow 3 step command 3  Language- read & follow direction 1  Write a sentence 1  Copy design 1  Total score 30        Immunization History  Administered Date(s) Administered  . Influenza Whole 12/10/2011  . Influenza, High Dose Seasonal PF 01/22/2016  . Influenza,inj,Quad PF,36+ Mos 12/21/2013, 11/15/2014  . Pneumococcal Conjugate-13 10/25/2013  . Pneumococcal-Unspecified 03/12/2003  . Tdap 03/12/2007  . Zoster 03/11/2008   Screening Tests Health Maintenance  Topic Date Due  . PNA vac Low Risk Adult (2 of 2 - PPSV23) 10/26/2014  . INFLUENZA VACCINE  10/09/2016  . TETANUS/TDAP  03/11/2017  . COLONOSCOPY  03/11/2018  . MAMMOGRAM  05/25/2018  . DEXA SCAN  Completed      Plan:     Follow up with Dr.Copland as directed.  Continue to eat heart healthy diet (full of fruits, vegetables, whole grains, lean protein, water--limit salt, fat, and sugar intake) and increase physical activity as tolerated.  Continue doing brain stimulating activities (puzzles, reading, adult coloring books, staying active) to keep memory sharp.   During the course of the visit the patient was educated and counseled about the following appropriate screening and preventive services:   Vaccines to include Pneumoccal, Influenza, Td, HCV  Cardiovascular Disease  Colorectal cancer screening  Bone density screening  Diabetes screening  Glaucoma screening  Mammography/PAP  Nutrition counseling   Patient Instructions (the written plan) was given to the patient.   Shela Nevin, [place]  07/01/2016   I have reviewed note by Ms. Vevelyn Royals and agree with her documentation J Copland MD 07/01/16

## 2016-07-01 ENCOUNTER — Ambulatory Visit (INDEPENDENT_AMBULATORY_CARE_PROVIDER_SITE_OTHER): Payer: Medicare Other | Admitting: *Deleted

## 2016-07-01 ENCOUNTER — Encounter: Payer: Self-pay | Admitting: *Deleted

## 2016-07-01 VITALS — BP 132/68 | HR 56 | Ht 65.0 in | Wt 185.0 lb

## 2016-07-01 DIAGNOSIS — Z0000 Encounter for general adult medical examination without abnormal findings: Secondary | ICD-10-CM

## 2016-07-01 NOTE — Patient Instructions (Addendum)
Continue to eat heart healthy diet (full of fruits, vegetables, whole grains, lean protein, water--limit salt, fat, and sugar intake) and increase physical activity as tolerated.  Continue doing brain stimulating activities (puzzles, reading, adult coloring books, staying active) to keep memory sharp. Fat and Cholesterol Restricted Diet High levels of fat and cholesterol in your blood may lead to various health problems, such as diseases of the heart, blood vessels, gallbladder, liver, and pancreas. Fats are concentrated sources of energy that come in various forms. Certain types of fat, including saturated fat, may be harmful in excess. Cholesterol is a substance needed by your body in small amounts. Your body makes all the cholesterol it needs. Excess cholesterol comes from the food you eat. When you have high levels of cholesterol and saturated fat in your blood, health problems can develop because the excess fat and cholesterol will gather along the walls of your blood vessels, causing them to narrow. Choosing the right foods will help you control your intake of fat and cholesterol. This will help keep the levels of these substances in your blood within normal limits and reduce your risk of disease. What is my plan? Your health care provider recommends that you:  Limit your fat intake to ______% or less of your total calories per day.  Limit the amount of cholesterol in your diet to less than _________mg per day.  Eat 20-30 grams of fiber each day. What types of fat should I choose?  Choose healthy fats more often. Choose monounsaturated and polyunsaturated fats, such as olive and canola oil, flaxseeds, walnuts, almonds, and seeds.  Eat more omega-3 fats. Good choices include salmon, mackerel, sardines, tuna, flaxseed oil, and ground flaxseeds. Aim to eat fish at least two times a week.  Limit saturated fats. Saturated fats are primarily found in animal products, such as meats, butter, and  cream. Plant sources of saturated fats include palm oil, palm kernel oil, and coconut oil.  Avoid foods with partially hydrogenated oils in them. These contain trans fats. Examples of foods that contain trans fats are stick margarine, some tub margarines, cookies, crackers, and other baked goods. What general guidelines do I need to follow? These guidelines for healthy eating will help you control your intake of fat and cholesterol:  Check food labels carefully to identify foods with trans fats or high amounts of saturated fat.  Fill one half of your plate with vegetables and green salads.  Fill one fourth of your plate with whole grains. Look for the word "whole" as the first word in the ingredient list.  Fill one fourth of your plate with lean protein foods.  Limit fruit to two servings a day. Choose fruit instead of juice.  Eat more foods that contain fiber, such as apples, broccoli, carrots, beans, peas, and barley.  Eat more home-cooked food and less restaurant, buffet, and fast food.  Limit or avoid alcohol.  Limit foods high in starch and sugar.  Limit fried foods.  Cook foods using methods other than frying. Baking, boiling, grilling, and broiling are all great options.  Lose weight if you are overweight. Losing just 5-10% of your initial body weight can help your overall health and prevent diseases such as diabetes and heart disease. What foods can I eat? Grains   Whole grains, such as whole wheat or whole grain breads, crackers, cereals, and pasta. Unsweetened oatmeal, bulgur, barley, quinoa, or brown rice. Corn or whole wheat flour tortillas. Vegetables   Fresh or frozen vegetables (raw,  steamed, roasted, or grilled). Green salads. Fruits   All fresh, canned (in natural juice), or frozen fruits. Meats and other protein foods   Ground beef (85% or leaner), grass-fed beef, or beef trimmed of fat. Skinless chicken or [place]. Ground chicken or [place]. Pork trimmed of  fat. All fish and seafood. Eggs. Dried beans, peas, or lentils. Unsalted nuts or seeds. Unsalted canned or dry beans. Dairy   Low-fat dairy products, such as skim or 1% milk, 2% or reduced-fat cheeses, low-fat ricotta or cottage cheese, or plain low-fat yo Fats and oils   Tub margarines without trans fats. Light or reduced-fat mayonnaise and salad dressings. Avocado. Olive, canola, sesame, or safflower oils. Natural peanut or almond butter (choose ones without added sugar and oil). The items listed above may not be a complete list of recommended foods or beverages. Contact your dietitian for more options.  Foods to avoid Grains   White bread. White pasta. White rice. Cornbread. Bagels, pastries, and croissants. Crackers that contain trans fat. Vegetables   White potatoes. Corn. Creamed or fried vegetables. Vegetables in a cheese sauce. Fruits   Dried fruits. Canned fruit in light or heavy syrup. Fruit juice. Meats and other protein foods   Fatty cuts of meat. Ribs, chicken wings, bacon, sausage, bologna, salami, chitterlings, fatback, hot dogs, bratwurst, and packaged luncheon meats. Liver and organ meats. Dairy   Whole or 2% milk, cream, half-and-half, and cream cheese. Whole milk cheeses. Whole-fat or sweetened yogurt. Full-fat cheeses. Nondairy creamers and whipped toppings. Processed cheese, cheese spreads, or cheese curds. Beverages   Alcohol. Sweetened drinks (such as sodas, lemonade, and fruit drinks or punches). Fats and oils   Butter, stick margarine, lard, shortening, ghee, or bacon fat. Coconut, palm kernel, or palm oils. Sweets and desserts   Corn syrup, sugars, honey, and molasses. Candy. Jam and jelly. Syrup. Sweetened cereals. Cookies, pies, cakes, donuts, muffins, and ice cream. The items listed above may not be a complete list of foods and beverages to avoid. Contact your dietitian for more information.  This information is not intended to replace advice given to  you by your health care provider. Make sure you discuss any questions you have with your health care provider. Document Released: 02/25/2005 Document Revised: 03/18/2014 Document Reviewed: 05/26/2013 Elsevier Interactive Patient Education  2017 [employer].

## 2016-07-09 DIAGNOSIS — H2511 Age-related nuclear cataract, right eye: Secondary | ICD-10-CM | POA: Diagnosis not present

## 2016-07-10 DIAGNOSIS — H2512 Age-related nuclear cataract, left eye: Secondary | ICD-10-CM | POA: Diagnosis not present

## 2016-07-23 DIAGNOSIS — H2512 Age-related nuclear cataract, left eye: Secondary | ICD-10-CM | POA: Diagnosis not present

## 2016-09-23 ENCOUNTER — Ambulatory Visit (INDEPENDENT_AMBULATORY_CARE_PROVIDER_SITE_OTHER): Payer: Medicare Other | Admitting: Medical

## 2016-09-23 VITALS — BP 137/62 | HR 59 | Temp 97.7°F | Resp 16 | Ht 65.0 in | Wt 189.8 lb

## 2016-09-23 DIAGNOSIS — W57XXXA Bitten or stung by nonvenomous insect and other nonvenomous arthropods, initial encounter: Secondary | ICD-10-CM

## 2016-09-23 DIAGNOSIS — S70369A Insect bite (nonvenomous), unspecified thigh, initial encounter: Secondary | ICD-10-CM | POA: Diagnosis not present

## 2016-09-23 MED ORDER — MUPIROCIN 2 % EX OINT: TOPICAL_OINTMENT | CUTANEOUS | 0 refills | Status: DC

## 2016-09-23 MED FILL — MUPIROCIN 2% OINTMENT: 2 | 14 days supply | Qty: 22 | Fill #0

## 2016-09-23 NOTE — Progress Notes (Signed)
Subjective:    Patient ID: Nicole Brennan, female    DOB: 1940-11-19, 76 y.o.   MRN: [id number]  HPI   Pt in states a week ago yesterday she found a tick on her rt thigh area medial region. She grabbed the tick and pulled it off. She is not sure if she got the entire tick off. Concern that head may have been left in.   No fever, no sweats, no chills, no body aches or rash.  Pt just states red bump on area where tick was.     Review of Systems  Constitutional: Negative for chills, fatigue and fever.  HENT: Negative for congestion.   Respiratory: Negative for cough, chest tightness, shortness of breath and wheezing.   Cardiovascular: Negative for chest pain and palpitations.  Gastrointestinal: Negative for abdominal pain.  Musculoskeletal: Negative for back pain and neck pain.  Skin: Negative for rash.       Rt medial thigh small tick bite.  Hematological: Negative for adenopathy. Does not bruise/bleed easily.  Psychiatric/Behavioral: Negative for hallucinations. The patient is not nervous/anxious.     Past Medical History:  Diagnosis Date  . Allergy   . Arthritis   . Cataract   . Thyroid disease      Social History   Social History  . Marital status: Married    Spouse name: N/A  . Number of children: N/A  . Years of education: N/A   Occupational History  . Not on file.   Social History Main Topics  . Smoking status: Never Smoker  . Smokeless tobacco: Never Used  . Alcohol use No  . Drug use: No  . Sexual activity: Not on file   Other Topics Concern  . Not on file   Social History Narrative  . No narrative on file    Past Surgical History:  Procedure Laterality Date  . DG  BONE DENSITY ([facility] HX)    . KNEE SURGERY    . TUBAL LIGATION      Family History  Problem Relation Age of Onset  . Cancer Mother   . Vascular Disease Mother   . Dementia Father     Allergies  Allergen Reactions  . Penicillins Hives    Current Outpatient Prescriptions  on File Prior to Visit  Medication Sig Dispense Refill  . calcium carbonate 200 MG capsule Take 250 mg by mouth 2 (two) times daily with a meal.    . Carboxymethylcellulose Sodium (THERATEARS OP) Apply to eye.    . Glycerin-Polysorbate 80 (REFRESH DRY EYE THERAPY OP) Apply 1 drop to eye as needed.    Marland Kitchen levothyroxine (SYNTHROID, LEVOTHROID) 75 MCG tablet Take 1 tablet daily 90 tablet 3  . Multiple Vitamins-Minerals (MULTIVITAMIN WITH MINERALS) tablet Take 1 tablet by mouth daily.    . Turmeric 450 MG CAPS Take 1 capsule by mouth daily.     No current facility-administered medications on file prior to visit.     BP 137/62   Pulse (!) 59   Temp 97.7 F (36.5 C) (Oral)   Resp 16   Ht 5\' 5"  (1.651 m)   Wt 189 lb 12.8 oz (86.1 kg)   SpO2 100%   BMI 31.58 kg/m       Objective:   Physical Exam  General- No acute distress. Pleasant patient. Neck- Full range of motion, no jvd Lungs- Clear, even and unlabored. Heart- regular rate and rhythm. Neurologic- CNII- XII grossly intact.  Rt thigh- medial distall area.  Red raised area 8 mm in size at region where tick was attached. No warmth, no induration. Or fluctuance.  MA misdocumented the location of bite. Was pt rt side.        Assessment & Plan:  For recent tick bite and mild inflammation at tick bite site will give you mupirocin topical antibiotic.  I will put in future tick bite labs to be done on this Friday. Presently I think too early to do study and antibody study could come back  falsely negative.  If by upcoming Monday small bump persists/same then will start doxycycline antibiotic. Or you would start doxycycline earlier if fever, chills, sweats body aches or classic tick bite rash.  Will follow the tick bite study results and let you know results when in.  Follow up in 7 days or as needed

## 2016-09-23 NOTE — Patient Instructions (Signed)
For recent tick bite and mild inflammation at tick bite site will give you mupirocin topical antibiotic.  I will put in future tick bite labs to be done on this Friday. Presently I think too early to do study and antibody study could come back  falsely negative.  If by upcoming Monday small bump persists/same then will start doxycycline antibiotic. Or you would start doxycycline earlier if fever, chills, sweats body aches or classic tick bite rash.  Will follow the tick bite study results and let you know results when in.  Follow up in 7 days or as needed

## 2016-09-27 ENCOUNTER — Other Ambulatory Visit: Payer: Medicare Other

## 2016-09-27 DIAGNOSIS — S70361A Insect bite (nonvenomous), right thigh, initial encounter: Secondary | ICD-10-CM | POA: Diagnosis not present

## 2016-09-27 DIAGNOSIS — W57XXXA Bitten or stung by nonvenomous insect and other nonvenomous arthropods, initial encounter: Secondary | ICD-10-CM

## 2016-09-30 LAB — ROCKY MTN SPOTTED FVR ABS PNL(IGG+IGM)
RMSF IGG: NOT DETECTED
RMSF IgM: NOT DETECTED

## 2016-09-30 LAB — LYME AB/WESTERN BLOT REFLEX: B burgdorferi Ab IgG+IgM: <0.9 Index (ref <0.90)

## 2016-10-02 NOTE — Progress Notes (Unsigned)
Reviewed pt better.

## 2016-10-03 ENCOUNTER — Telehealth: Payer: Self-pay | Admitting: Family Medicine

## 2016-10-03 NOTE — Telephone Encounter (Signed)
Patient Name: Nicole Brennan  DOB: 02-27-1941    Initial Comment Caller states she has been having bad headaches since last Sunday. Sharp shooting pains in her head, on the side.   Nurse Assessment  Nurse: Christel Mormon, RN, Levada Dy Date/Time Eilene Ghazi Time): 10/03/2016 2:07:22 PM  Confirm and document reason for call. If symptomatic, describe symptoms. ---Caller has been having sharp, shooting pain in the left side of her head. The pain is pretty much non-stop. She has taken a lot of Tylenol which is not working. She had her glasses changed and though it could be strain on her eye. She is going back to the eye doctor later in the day. Her husband wanted her to see Dr. Edilia Bo. She did not want to see anyone else in the office.  Does the patient have any new or worsening symptoms? ---Yes  Will a triage be completed? ---Yes  Related visit to physician within the last 2 weeks? ---No  Does the PT have any chronic conditions? (i.e. diabetes, asthma, etc.) ---Yes  List chronic conditions. ---hypothyroidism, dry eye  Is this a behavioral health or substance abuse call? ---No     Guidelines    Guideline Title Affirmed Question Affirmed Notes  Headache [1] MODERATE headache (e.g., interferes with normal activities) AND [2] present > 24 hours AND [3] unexplained (Exceptions: analgesics not tried, typical migraine, or headache part of viral illness)    Final Disposition User   See Physician within [facility], RN, Levada Dy    Comments  unable to make appt- no "acute" option given- office was able to give her one for 9:30am with the NP.   Referrals  REFERRED TO PCP OFFICE   Disagree/Comply: Comply

## 2016-10-04 ENCOUNTER — Ambulatory Visit (INDEPENDENT_AMBULATORY_CARE_PROVIDER_SITE_OTHER): Payer: Medicare Other | Admitting: Family

## 2016-10-04 VITALS — BP 111/47 | HR 73 | Temp 97.9°F | Ht 65.0 in | Wt 186.0 lb

## 2016-10-04 DIAGNOSIS — R292 Abnormal reflex: Secondary | ICD-10-CM

## 2016-10-04 DIAGNOSIS — G500 Trigeminal neuralgia: Secondary | ICD-10-CM

## 2016-10-04 DIAGNOSIS — R519 Headache, unspecified: Secondary | ICD-10-CM

## 2016-10-04 DIAGNOSIS — R51 Headache: Secondary | ICD-10-CM

## 2016-10-04 LAB — COMPREHENSIVE METABOLIC PANEL
ALBUMIN: 3.7 g/dL (ref 3.5–5.2)
ALK PHOS: 63 U/L (ref 39–117)
ALT: 79 U/L — ABNORMAL HIGH (ref 0–35)
AST: 66 U/L — ABNORMAL HIGH (ref 0–37)
BUN: 12 mg/dL (ref 6–23)
CO2: 27 mEq/L (ref 19–32)
Calcium: 9.5 mg/dL (ref 8.4–10.5)
Chloride: 107 mEq/L (ref 96–112)
Creatinine, Ser: 0.9 mg/dL (ref 0.40–1.20)
GFR: 64.94 mL/min (ref >60.00)
GLUCOSE: 96 mg/dL (ref 70–99)
POTASSIUM: 4.2 meq/L (ref 3.5–5.1)
Sodium: 140 mEq/L (ref 135–145)
TOTAL PROTEIN: 6.7 g/dL (ref 6.0–8.3)
Total Bilirubin: 0.4 mg/dL (ref 0.2–1.2)

## 2016-10-04 LAB — SEDIMENTATION RATE: SED RATE: 13 mm/h (ref 0–30)

## 2016-10-04 LAB — TSH: TSH: 2.34 u[IU]/mL (ref 0.35–4.50)

## 2016-10-04 MED ORDER — GABAPENTIN 100 MG PO CAPS: 100.0000 mg | ORAL_CAPSULE | Freq: Three times a day (TID) | ORAL | 0 refills | Status: DC

## 2016-10-04 MED FILL — GABAPENTIN 100 MG CAPSULE: 100 | 30 days supply | Qty: 90 | Fill #0

## 2016-10-04 NOTE — Patient Instructions (Addendum)
Please complete lab work prior to leaving. I think that your left sided sharp pain may be related to a condition called trigeminal neuralgia. Please begin trial of gabapentin 3 times daily to see if this helps with her symptoms. He may continue to use Tylenol as needed for headache. Ensure adequate rest. Try to drink less iced tea and more water to improve her hydration. I think he may be mildly dehydrated today. Call if symptoms worsen. Please keep your upcoming appointment with Dr. Lorelei Pont.   Trigeminal Neuralgia Trigeminal neuralgia is a nerve disorder that causes attacks of severe facial pain. The attacks last from a few seconds to several minutes. They can happen for days, weeks, or months and then go away for months or years. Trigeminal neuralgia is also called tic douloureux. What are the causes? This condition is caused by damage to a nerve in the face that is called the trigeminal nerve. An attack can be triggered by:  Talking.  Chewing.  Putting on makeup.  Washing your face.  Shaving your face.  Brushing your teeth.  Touching your face.  What increases the risk? This condition is more likely to develop in:  Women.  People who are 60 years of age or older.  What are the signs or symptoms? The main symptom of this condition is pain in the jaw, lips, eyes, nose, scalp, forehead, and face. The pain may be intense, stabbing, electric, or shock-like. How is this diagnosed? This condition is diagnosed with a physical exam. A CT scan or MRI may be done to rule out other conditions that can cause facial pain. How is this treated? This condition may be treated with:  Avoiding the things that trigger your attacks.  Pain medicine.  Surgery. This may be done in severe cases if other medical treatment does not provide relief.  Follow these instructions at home:  Take over-the-counter and prescription medicines only as told by your health care provider.  If you wish to  get pregnant, talk with your health care provider before you start trying to get pregnant.  Avoid the things that trigger your attacks. It may help to: ? Chew on the unaffected side of your mouth. ? Avoid touching your face. ? Avoid blasts of hot or cold air. Contact a health care provider if:  Your pain medicine is not helping.  You develop new, unexplained symptoms, such as: ? Double vision. ? Facial weakness. ? Changes in hearing or balance.  You become pregnant. Get help right away if:  Your pain is unbearable, and your pain medicine does not help. This information is not intended to replace advice given to you by your health care provider. Make sure you discuss any questions you have with your health care provider. Document Released: 02/23/2000 Document Revised: 10/29/2015 Document Reviewed: 06/20/2014 Elsevier Interactive Patient Education  [employer].

## 2016-10-04 NOTE — Addendum Note (Signed)
Addended by: Peggyann Shoals on: 10/04/2016 11:03 AM   Modules accepted: Orders

## 2016-10-04 NOTE — Progress Notes (Signed)
Subjective:    Patient ID: Nicole Brennan, female    DOB: 1940/11/27, 76 y.o.   MRN: [id number]  HPI   Ms. Akkerman is a 76 year old female with history of seasonal allergies, osteoporosis, hypothyroidism, and osteoarthritis, who presents today with chief complaint of migraine headache. She reports that her headache began on Sunday, July 22. She reports that She has a nagging dull headache. Intermittently she has sharp shooting pain on the left side of her temple. She admits to working quite hard in keeping a very fast pace schedule. 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  She reports occasional associated dizziness. Per chart review she does not have history of migraines.  Reports that her headache this AM is behind her eyes and   continues to have occasional  Sharp left  sided pain in the left temple. Denies visual problems.  Reports she had mild nausea but no vomiting. Reports that she applied ice.  She denies facial numbness.  She has used tylenol with mild improvement. She denies sinus congestion     Review of Systems See HPI  Past Medical History:  Diagnosis Date  . Allergy   . Arthritis   . Cataract   . Thyroid disease      Social History   Social History  . Marital status: Married    Spouse name: N/A  . Number of children: N/A  . Years of education: N/A   Occupational History  . Not on file.   Social History Main Topics  . Smoking status: Never Smoker  . Smokeless tobacco: Never Used  . Alcohol use No  . Drug use: No  . Sexual activity: Not on file   Other Topics Concern  . Not on file   Social History Narrative  . No narrative on file    Past Surgical History:  Procedure Laterality Date  . DG  BONE DENSITY ([facility] HX)    . KNEE SURGERY    . TUBAL LIGATION      Family History  Problem Relation Age of Onset  . Cancer Mother   . Vascular Disease Mother   . Dementia Father      Allergies  Allergen Reactions  . Penicillins Hives    Current Outpatient Prescriptions on File Prior to Visit  Medication Sig Dispense Refill  . calcium carbonate 200 MG capsule Take 250 mg by mouth 2 (two) times daily with a meal.    . Carboxymethylcellulose Sodium (THERATEARS OP) Apply to eye.    . Glycerin-Polysorbate 80 (REFRESH DRY EYE THERAPY OP) Apply 1 drop to eye as needed.    Marland Kitchen levothyroxine (SYNTHROID, LEVOTHROID) 75 MCG tablet Take 1 tablet daily 90 tablet 3  . Multiple Vitamins-Minerals (MULTIVITAMIN WITH MINERALS) tablet Take 1 tablet by mouth daily.    . mupirocin ointment (BACTROBAN) 2 % Apply to area twice daily. 22 g 0  . Turmeric 450 MG CAPS Take 1 capsule by mouth daily.     No current facility-administered medications on file prior to visit.     BP (!) 111/47   Pulse 73   Temp 97.9 F (36.6 C) (Oral)   Ht 5' 5"  (1.651 m)   Wt 186 lb (84.4 kg)   SpO2 98%   BMI 30.95 kg/m       Objective:   Physical Exam  Constitutional: She is oriented to person, place, and time. She appears well-developed  and well-nourished.  HENT:  Head: Normocephalic and atraumatic.  Right Ear: Tympanic membrane and ear canal normal.  Left Ear: Tympanic membrane and ear canal normal.  Mouth/Throat: No oropharyngeal exudate, posterior oropharyngeal edema or posterior oropharyngeal erythema.  Eyes: Pupils are equal, round, and reactive to light. EOM are normal.  Cardiovascular: Normal rate, regular rhythm and normal heart sounds.   No murmur heard. Pulmonary/Chest: Effort normal and breath sounds normal. No respiratory distress. She has no wheezes.  Musculoskeletal: She exhibits no edema.  Lymphadenopathy:    She has no cervical adenopathy.  Neurological: She is alert and oriented to person, place, and time. No cranial nerve deficit. She exhibits normal muscle tone.  Reflex Scores:      Patellar reflexes are 3+ on the right side and 3+ on the left side. Skin: Skin is warm and  dry.  Psychiatric: She has a normal mood and affect. Her behavior is normal. Judgment and thought content normal.          Assessment & Plan:  Orthostatic vitals are taken today and she is noted to have  mild orthostasis with a drop in her systolic pressure from 322-025. Her heart rate went up slightly from 75-85 bpm with standing. She reports that she drinks large amounts of onset we tea. She drinks very little water. I encouraged her to decrease her tea consumption and increase her water consumption. She appears mildly dehydrated today based on her orthostatics.  Headache-it sounds more like a tension headache rather than a migraine headache. I advised patient to continue Tylenol on an as-needed basis.   Trigeminal neuralgia-reports of severe intermittent sharp shooting pain in the left temple. Symptoms suggestive of trigeminal neuralgia. Will give her a trial of gabapentin. She has an upcoming appointment with Dr. Edilia Bo and I have advised her to keep this appointment. I will check some basic laboratories including a MET and a TSH. She was noted to be quite hyperreflexive today on exam. I will also check an ESR to rule out temporal arteritis.

## 2016-10-08 ENCOUNTER — Telehealth: Payer: Self-pay | Admitting: Family Medicine

## 2016-10-08 NOTE — Telephone Encounter (Signed)
Caller name: Tillie Rung with [facility] Can be reached: [phone]  Reason for call: Please call with dx code for lab testing 09/27/16. Cannot use tick bite DX for billing.

## 2016-10-09 ENCOUNTER — Telehealth: Payer: Self-pay | Admitting: Medical

## 2016-10-09 NOTE — Telephone Encounter (Signed)
Called and talked to [facility]. Dx tick bite not covered by medicare? Explained no associated symptoms at time when I ordered test. So can't add dx. She states will resubmit.

## 2016-10-13 NOTE — Progress Notes (Addendum)
[place] at [facility] [address], [place], [place] [address] [phone]  Date:  10/14/2016   Name:  Nicole Brennan   DOB:  07-30-1940   MRN:  [id number]  PCP:  Darreld Mclean, MD    Chief Complaint: Follow-up (Pt here to f/u on visit from 10/04/16. )   History of Present Illness:  Nicole Brennan is a 76 y.o. very pleasant female patient who presents with the following:  Seen in clinic a couple of weeks ago by Lenna Sciara with headache:  Orthostatic vitals are taken today and she is noted to have  mild orthostasis with a drop in her systolic pressure from 570-177. Her heart rate went up slightly from 75-85 bpm with standing. She reports that she drinks large amounts of onset we tea. She drinks very little water. I encouraged her to decrease her tea consumption and increase her water consumption. She appears mildly dehydrated today based on her orthostatics.  Headache-it sounds more like a tension headache rather than a migraine headache. I advised patient to continue Tylenol on an as-needed basis.   Trigeminal neuralgia-reports of severe intermittent sharp shooting pain in the left temple. Symptoms suggestive of trigeminal neuralgia. Will give her a trial of gabapentin. She has an upcoming appointment with Dr. Edilia Bo and I have advised her to keep this appointment. I will check some basic laboratories including a MET and a TSH. She was noted to be quite hyperreflexive today on exam. I will also check an ESR to rule out temporal arteritis  At the time of her last visit she was having specific left sided temple pain.  Concern for TA vs trigeminal neuralgia.   Her sed rate was normal, but her AST/ ALT were slightly elevated ; TA not likely due to normal sed rate Melissa had her start on some gabapentin, but [name] was not sure about taking this. She took it TID for a while but is now down to BID  BP Readings from Last 3 Encounters:   10/14/16 110/70  10/04/16 (!) 111/47  09/23/16 137/62   The pain is now resolved  She is still very active in playing the piano, clearning- feels that she is back to pretty much normal as far as her energy level and functioning  She is under stress- she and her husband both still work, they have to because of finances.  However this is not different or worse than normal  Her BP seems to be back to normal and she is no longer feeling faint.  Will also repeat her LFts today Lab Results  Component Value Date   TSH 2.34 10/04/2016   Her recent thyroid level was normal She has not noted any vomiting or diarrhea  No fever or chills appetite is normal No rash   Patient Active Problem List   Diagnosis Date Noted  . Seasonal allergies 04/21/2012  . Cataract 04/21/2012  . Osteoporosis 05/01/2011  . Hypothyroidism 04/30/2011  . Osteoarthritis 04/30/2011    Past Medical History:  Diagnosis Date  . Allergy   . Arthritis   . Cataract   . Thyroid disease     Past Surgical History:  Procedure Laterality Date  . DG  BONE DENSITY ([place] HX)    . KNEE SURGERY    . TUBAL LIGATION      Social History  Substance Use Topics  . Smoking status: Never Smoker  . Smokeless tobacco: Never Used  .  Alcohol use No    Family History  Problem Relation Age of Onset  . Cancer Mother   . Vascular Disease Mother   . Dementia Father     Allergies  Allergen Reactions  . Penicillins Hives    Medication list has been reviewed and updated.  Current Outpatient Prescriptions on File Prior to Visit  Medication Sig Dispense Refill  . calcium carbonate 200 MG capsule Take 250 mg by mouth 2 (two) times daily with a meal.    . Carboxymethylcellulose Sodium (THERATEARS OP) Apply to eye.    . gabapentin (NEURONTIN) 100 MG capsule Take 1 capsule (100 mg total) by mouth 3 (three) times daily. 90 capsule 0  . Glycerin-Polysorbate 80 (REFRESH DRY EYE THERAPY OP) Apply 1 drop to eye as needed.    Marland Kitchen  levothyroxine (SYNTHROID, LEVOTHROID) 75 MCG tablet Take 1 tablet daily 90 tablet 3  . Multiple Vitamins-Minerals (MULTIVITAMIN WITH MINERALS) tablet Take 1 tablet by mouth daily.    . Turmeric 450 MG CAPS Take 1 capsule by mouth daily.     No current facility-administered medications on file prior to visit.     Review of Systems:  As per HPI- otherwise negative. She does not have any pain in her teeth or jaw    Physical Examination: Vitals:   10/14/16 1257  BP: 110/70  Pulse: 75  Temp: 98 F (36.7 C)   Vitals:   10/14/16 1257  Weight: 188 lb 6.4 oz (85.5 kg)  Height: _0  (1.651 m)   Body mass index is 31.35 kg/m. Ideal Body Weight: Weight in (lb) to have BMI = 25: 149.9  GEN: WDWN, NAD, Non-toxic, A & O x 3, looks well, overweight HEENT: Atraumatic, Normocephalic. Neck supple. No masses, No LAD. Ears and Nose: No external deformity. CV: RRR, No M/G/R. No JVD. No thrill. No extra heart sounds. PULM: CTA B, no wheezes, crackles, rhonchi. No retractions. No resp. distress. No accessory muscle use. EXTR: No c/c/e NEURO Normal gait.  PSYCH: Normally interactive. Conversant. Not depressed or anxious appearing.  Calm demeanor.  No tenderness to the left side of her face or head to suggest TA, no nodules, rash, lesion or swelling   Assessment and Plan: Transaminitis - Plan: Hepatic function panel  Idiopathic hypotension - Plan: CBC  Headache, unspecified headache type  Here today for a followup visit HA is resolved- she would like to taper off gabapentin which is ok Will repeat her LFTs today, check CBC She will let me know if her HA come back Plan to visit in a few months for a routine recheck  Signed Lamar Blinks, MD  Received her labs 8/7  Results for orders placed or performed in visit on 10/14/16  Hepatic function panel  Result Value Ref Range   Total Bilirubin 0.6 0.2 - 1.2 mg/dL   Bilirubin, Direct 0.1 0.0 - 0.3 mg/dL   Alkaline Phosphatase 52 39 -  117 U/L   AST 26 0 - 37 U/L   ALT 28 0 - 35 U/L   Total Protein 6.8 6.0 - 8.3 g/dL   Albumin 4.0 3.5 - 5.2 g/dL  CBC  Result Value Ref Range   WBC 6.2 4.0 - 10.5 K/uL   RBC 3.74 (L) 3.87 - 5.11 Mil/uL   Platelets 477.0 (H) 150.0 - 400.0 K/uL   Hemoglobin 12.1 12.0 - 15.0 g/dL   HCT 36.2 36.0 - 46.0 %   MCV 96.6 78.0 - 100.0 fl   MCHC 33.5 30.0 -  36.0 g/dL   RDW 13.6 11.5 - 15.5 %

## 2016-10-14 ENCOUNTER — Ambulatory Visit (INDEPENDENT_AMBULATORY_CARE_PROVIDER_SITE_OTHER): Payer: Medicare Other | Admitting: Family Medicine

## 2016-10-14 VITALS — BP 110/70 | HR 75 | Temp 98.0°F | Ht 65.0 in | Wt 188.4 lb

## 2016-10-14 DIAGNOSIS — I950 Idiopathic hypotension: Secondary | ICD-10-CM | POA: Diagnosis not present

## 2016-10-14 DIAGNOSIS — R51 Headache: Secondary | ICD-10-CM | POA: Diagnosis not present

## 2016-10-14 DIAGNOSIS — R740 Nonspecific elevation of levels of transaminase and lactic acid dehydrogenase [LDH]: Secondary | ICD-10-CM | POA: Diagnosis not present

## 2016-10-14 DIAGNOSIS — R519 Headache, unspecified: Secondary | ICD-10-CM

## 2016-10-14 DIAGNOSIS — R7401 Elevation of levels of liver transaminase levels: Secondary | ICD-10-CM

## 2016-10-14 LAB — CBC
HEMATOCRIT: 36.2 % (ref 36.0–46.0)
HEMOGLOBIN: 12.1 g/dL (ref 12.0–15.0)
MCHC: 33.5 g/dL (ref 30.0–36.0)
MCV: 96.6 fl (ref 78.0–100.0)
Platelets: 477 10*3/uL — ABNORMAL HIGH (ref 150.0–400.0)
RBC: 3.74 Mil/uL — AB (ref 3.87–5.11)
RDW: 13.6 % (ref 11.5–15.5)
WBC: 6.2 10*3/uL (ref 4.0–10.5)

## 2016-10-14 LAB — HEPATIC FUNCTION PANEL
ALT: 28 U/L (ref 0–35)
AST: 26 U/L (ref 0–37)
Albumin: 4 g/dL (ref 3.5–5.2)
Alkaline Phosphatase: 52 U/L (ref 39–117)
BILIRUBIN DIRECT: 0.1 mg/dL (ref 0.0–0.3)
BILIRUBIN TOTAL: 0.6 mg/dL (ref 0.2–1.2)
Total Protein: 6.8 g/dL (ref 6.0–8.3)

## 2016-10-14 NOTE — Patient Instructions (Signed)
It was nice to see you today- try to get some rest and take naps when you can I will be in touch with your labs asap Please let me know if your headaches come back You can cut the gabapentin down to 1 pill daily for 3-4 days, then you can stop it completely

## 2016-10-15 ENCOUNTER — Telehealth: Payer: Self-pay | Admitting: Family Medicine

## 2016-10-15 ENCOUNTER — Encounter: Payer: Self-pay | Admitting: Family Medicine

## 2016-10-15 NOTE — Telephone Encounter (Signed)
°  Relation to YB:RKVT Call back number:[phone] Pharmacy:  Reason for call:  Patient would like to discuss lab results today,please advise

## 2016-10-15 NOTE — Telephone Encounter (Signed)
Returned pt's call. Lab results and provider recommendations discussed. Pt verbalized understanding and has no additional concerns at this time.

## 2016-10-29 ENCOUNTER — Encounter: Payer: Self-pay | Admitting: Family Medicine

## 2016-11-05 ENCOUNTER — Other Ambulatory Visit: Payer: Self-pay | Admitting: Family Medicine

## 2016-11-05 DIAGNOSIS — E038 Other specified hypothyroidism: Secondary | ICD-10-CM

## 2016-11-05 MED ORDER — LEVOTHYROXINE SODIUM 75 MCG PO TABS: ORAL_TABLET | ORAL | 3 refills | Status: DC

## 2016-11-05 NOTE — Telephone Encounter (Signed)
Please inform Pt that Rx has been sent for 1 year.

## 2016-11-05 NOTE — Telephone Encounter (Signed)
Caller name: Nicole Brennan  Relation to pt: self  Call back number: [phone] Pharmacy: [place], [address]  Reason for call: Pt states is needing a renewal rx for Levothyroxine 75 MG rx #[id number] (account # [id number]) sent to [facility] Pt would like to have it for a year and would like to be called when the renewal has been sent to her pharmacy. Please advise. (Pt also left written document about the renewal- pt at the front office tray)

## 2016-11-07 NOTE — Telephone Encounter (Signed)
Pt notified and made aware.  She was very thankful for the call. No additional needs voiced.

## 2016-12-25 NOTE — Progress Notes (Signed)
[place] at [facility] [address], [place], [place] [address] [phone]  Date:  12/26/2016   Name:  Nicole Brennan   DOB:  March 07, 1941   MRN:  [id number]  PCP:  Darreld Mclean, MD    Chief Complaint: Blister (c/o possible infection left big toe. Pt denies any pain. )   History of Present Illness:  Nicole Brennan is a 76 y.o. very pleasant female patient who presents with the following:  History of hypothyroidism She lost her left great toenail a couple of years ago when she dropped a coke can on it. Since then she has had a false nail placed on the toe a few times for appearance  About a week ago she had a pedicure and the tech placed an acrylic nail on the toe. A couple of days later a blister appeared at the base of the nail  The toe is not really painful, and she doesn't have any fever or other systemic symptoms.  She is just a bit concerned and would like to drain the blister if we can Would also like to get her flu shot today   Patient Active Problem List   Diagnosis Date Noted  . Seasonal allergies 04/21/2012  . Cataract 04/21/2012  . Osteoporosis 05/01/2011  . Hypothyroidism 04/30/2011  . Osteoarthritis 04/30/2011    Past Medical History:  Diagnosis Date  . Allergy   . Arthritis   . Cataract   . Thyroid disease     Past Surgical History:  Procedure Laterality Date  . DG  BONE DENSITY ([place] HX)    . KNEE SURGERY    . TUBAL LIGATION      Social History  Substance Use Topics  . Smoking status: Never Smoker  . Smokeless tobacco: Never Used  . Alcohol use No    Family History  Problem Relation Age of Onset  . Cancer Mother   . Vascular Disease Mother   . Dementia Father     Allergies  Allergen Reactions  . Penicillins Hives    Medication list has been reviewed and updated.  Current Outpatient Prescriptions on File Prior to Visit  Medication Sig Dispense Refill  . calcium carbonate 200 MG  capsule Take 250 mg by mouth 2 (two) times daily with a meal.    . Carboxymethylcellulose Sodium (THERATEARS OP) Apply to eye.    . gabapentin (NEURONTIN) 100 MG capsule Take 1 capsule (100 mg total) by mouth 3 (three) times daily. 90 capsule 0  . Glycerin-Polysorbate 80 (REFRESH DRY EYE THERAPY OP) Apply 1 drop to eye as needed.    Marland Kitchen levothyroxine (SYNTHROID, LEVOTHROID) 75 MCG tablet Take 1 tablet daily 90 tablet 3  . Multiple Vitamins-Minerals (MULTIVITAMIN WITH MINERALS) tablet Take 1 tablet by mouth daily.    . Turmeric 450 MG CAPS Take 1 capsule by mouth daily.     No current facility-administered medications on file prior to visit.     Review of Systems:  As per HPI- otherwise negative.   Physical Examination: Vitals:   12/26/16 1751  BP: 122/72  Temp: 97.8 F (36.6 C)  SpO2: 99%   Vitals:   12/26/16 1751  Weight: 191 lb 12.8 oz (87 kg)  Height: 5\' 5"  (1.651 m)   Body mass index is 31.92 kg/m. Ideal Body Weight: Weight in (lb) to have BMI = 25: 149.9  GEN: WDWN, NAD, Non-toxic, A & O x 3, overweight, looks  well HEENT: Atraumatic, Normocephalic. Neck supple. No masses, No LAD. Ears and Nose: No external deformity. CV: RRR, No M/G/R. No JVD. No thrill. No extra heart sounds. PULM: CTA B, no wheezes, crackles, rhonchi. No retractions. No resp. distress. No accessory muscle use. EXTR: No c/c/e NEURO Normal gait.  PSYCH: Normally interactive. Conversant. Not depressed or anxious appearing.  Calm demeanor.  Left great toe: at the base of the nail is a soft, fluid filled blister.  It is not painful or red, no heat or swelling  Pt would like to drain blister.  Prepped with alcohol and made a small incision with scalpel.  Drained clear serous fluid from blister   Assessment and Plan: Blister (nonthermal), right great toe, initial encounter  Immunization due  Here today with a blister of the left great toe- treated as above Discussed after care with her- she will  watch for any sign of infection but I suspect this will do well Flu shot today  Signed Lamar Blinks, MD

## 2016-12-26 ENCOUNTER — Ambulatory Visit (INDEPENDENT_AMBULATORY_CARE_PROVIDER_SITE_OTHER): Payer: Medicare Other | Admitting: Family Medicine

## 2016-12-26 VITALS — BP 122/72 | Temp 97.8°F | Ht 65.0 in | Wt 191.8 lb

## 2016-12-26 DIAGNOSIS — Z23 Encounter for immunization: Secondary | ICD-10-CM

## 2016-12-26 DIAGNOSIS — S90422A Blister (nonthermal), left great toe, initial encounter: Secondary | ICD-10-CM

## 2016-12-26 NOTE — Patient Instructions (Addendum)
It was nice to see you today!  Take care and watch for any sign of infection of your toe- heat, redness, pain, pus, swelling If any of these occur please let me know right away!    Just wash the toe with soap and water as you normally would.  Keep a band- aid or closed shoes when outdoors  You got your flu shot today

## 2016-12-31 ENCOUNTER — Ambulatory Visit: Payer: Medicare Other

## 2017-03-22 NOTE — Progress Notes (Signed)
[place] at [facility] [address] [place], [place] [address] [phone]  Date:  03/24/2017   Name:  Nicole Brennan   DOB:  1940/10/24   MRN:  [id number]  PCP:  Darreld Mclean, MD    Chief Complaint: Spasms (c/o lower back spasms last week. )   History of Present Illness:  Nicole Brennan is a 77 y.o. very pleasant female patient who presents with the following:  Here today with concern of back spasm History of arthritis, osteoporosis and hypothyroidism   Lab Results  Component Value Date   TSH 2.34 10/04/2016   Tetanus: this is due, but not covered  Pneumovax booster? 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    Patient Active Problem List   Diagnosis Date Noted  . Seasonal allergies 04/21/2012  . Cataract 04/21/2012  . Osteoporosis 05/01/2011  . Hypothyroidism 04/30/2011  . Osteoarthritis 04/30/2011    Past Medical History:  Diagnosis Date  . Allergy   . Arthritis   . Cataract   . Thyroid disease     Past Surgical History:  Procedure Laterality Date  . DG  BONE DENSITY ([place] HX)    . KNEE SURGERY    . TUBAL LIGATION      Social History   Tobacco Use  .  Smoking status: Never Smoker  . Smokeless tobacco: Never Used  Substance Use Topics  . Alcohol use: No  . Drug use: No    Family History  Problem Relation Age of Onset  . Cancer Mother   . Vascular Disease Mother   . Dementia Father     Allergies  Allergen Reactions  . Penicillins Hives    Medication list has been reviewed and updated.  Current Outpatient Medications on File Prior to Visit  Medication Sig Dispense Refill  . calcium carbonate 200 MG capsule Take 250 mg by mouth 2 (two) times daily with a meal.    . Carboxymethylcellulose Sodium (THERATEARS OP) Apply to eye.    . gabapentin (NEURONTIN) 100 MG capsule Take 1 capsule (100 mg total) by mouth 3 (three) times daily. 90 capsule 0  . Glycerin-Polysorbate 80 (REFRESH DRY EYE THERAPY OP) Apply 1 drop to eye as needed.    Marland Kitchen levothyroxine (SYNTHROID, LEVOTHROID) 75 MCG tablet Take 1 tablet daily 90 tablet 3  . Multiple Vitamins-Minerals (MULTIVITAMIN WITH MINERALS) tablet Take 1 tablet by mouth daily.    . Turmeric 450 MG CAPS Take 1 capsule by mouth daily.     No current  facility-administered medications on file prior to visit.     Review of Systems:  As per HPI- otherwise negative. No fever or chills   Physical Examination: Vitals:   03/24/17 1052  BP: 130/82  Pulse: 66  SpO2: 98%   Vitals:   03/24/17 1052  Weight: 190 lb 9.6 oz (86.5 kg)  Height: 5\' 5"  (1.651 m)   Body mass index is 31.72 kg/m. Ideal Body Weight: Weight in (lb) to have BMI = 25: 149.9  GEN: WDWN, NAD, Non-toxic, A & O x 3, mild overweight, looks well HEENT: Atraumatic, Normocephalic. Neck supple. No masses, No LAD.  Bilateral TM wnl, oropharynx normal.  PEERL,EOMI.   Ears and Nose: No external deformity. CV: RRR, No M/G/R. No JVD. No thrill. No extra heart sounds. PULM: CTA B, no wheezes, crackles, rhonchi. No retractions. No resp. distress. No accessory muscle use. ABD: S, NT, ND, +BS. No rebound. No HSM. EXTR: No c/c/e NEURO  Normal gait.  PSYCH: Normally interactive. Conversant. Not depressed or anxious appearing.  Calm demeanor.  Normal strength and sensation of both LE, normal DTR, negative SLR Tenderness over the right SI joint    Assessment and Plan: Spasm of muscle of lower back - Plan: DG HIP UNILAT W OR W/O PELVIS 2-3 VIEWS RIGHT, methocarbamol (ROBAXIN) 500 MG tablet  Right hip pain - Plan: DG HIP UNILAT W OR W/O PELVIS 2-3 VIEWS RIGHT  Discussed immunizations today- she wishes to defer due to cost, but discussed pneumovax and tetanus for her Robaxin to use if needed for muscle spasm Will obtain plain films for her today She will let me know if her pain does not continue to improve   Signed Lamar Blinks, MD   Mail radiology report to pt - letter to pt  You have some arthritis in your hips, on both sides. However, as long as your hips are not chronically painful there is nothing that we need to do about this.  The x-rays do mention likely loss of bone density. Your dexa scan from 2016 however was normal. I would recommend that we repeat your bone density this year.  Please let me know if you want me to go ahead and order this test for you!  Dg Hip Unilat W Or W/o Pelvis 2-3 Views Right  Result Date: 03/24/2017 CLINICAL DATA:  Pain for 2 weeks EXAM: DG HIP (WITH OR WITHOUT PELVIS) 2-3V RIGHT COMPARISON:  None : Frontal pelvis as well as frontal and lateral right hip images were obtained. No fracture or dislocation. There is mild symmetric narrowing of both hip joints. No erosive change. There is mild sclerosis in the pubic symphysis region without erosion. There are surgical clips in the fallopian tube regions. Sacroiliac joints appear unremarkable. There is periarticular osteoporosis. IMPRESSION: Symmetric narrowing both hip joints. 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 No fracture or dislocation. 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 Electronically Signed   By: Lowella Grip  III M.D.   On: 03/24/2017 11:43

## 2017-03-24 ENCOUNTER — Ambulatory Visit (INDEPENDENT_AMBULATORY_CARE_PROVIDER_SITE_OTHER): Payer: Medicare Other | Admitting: Family Medicine

## 2017-03-24 ENCOUNTER — Encounter: Payer: Self-pay | Admitting: Family Medicine

## 2017-03-24 ENCOUNTER — Ambulatory Visit (HOSPITAL_BASED_OUTPATIENT_CLINIC_OR_DEPARTMENT_OTHER)
Admission: RE | Admit: 2017-03-24 | Discharge: 2017-03-24 | Disposition: A | Discharge status: Home / Self Care | Payer: Medicare Other | Source: Ambulatory Visit | Attending: Family Medicine | Admitting: Family Medicine

## 2017-03-24 VITALS — BP 130/82 | HR 66 | Ht 65.0 in | Wt 190.6 lb

## 2017-03-24 DIAGNOSIS — M25551 Pain in right hip: Secondary | ICD-10-CM

## 2017-03-24 DIAGNOSIS — M810 Age-related osteoporosis without current pathological fracture: Secondary | ICD-10-CM | POA: Insufficient documentation

## 2017-03-24 DIAGNOSIS — M62830 Muscle spasm of back: Secondary | ICD-10-CM | POA: Diagnosis not present

## 2017-03-24 DIAGNOSIS — M160 Bilateral primary osteoarthritis of hip: Secondary | ICD-10-CM | POA: Diagnosis not present

## 2017-03-24 MED ORDER — METHOCARBAMOL 500 MG PO TABS: 500.0000 mg | ORAL_TABLET | Freq: Three times a day (TID) | ORAL | 0 refills | Status: DC | PRN

## 2017-03-24 MED FILL — METHOCARBAMOL 500 MG TABLET: 500 | 10 days supply | Qty: 30 | Fill #0

## 2017-03-24 NOTE — Patient Instructions (Signed)
It was great to see you today- take care!  Let's plan to visit in July for labs, etc We will get an x-ray of your right hip and pelvis today- I will be in touch with this report  Try the robaxin as needed for back pain, but remember this can make you feel a bit sleepy You could have another dose of Pneumovax 23 (pneumonia shot) and a tetanus booster at your convenience if you have any sort of significant wound you will need a tetanus shot

## 2017-04-02 DIAGNOSIS — M545 Low back pain: Secondary | ICD-10-CM | POA: Diagnosis not present

## 2017-04-02 MED FILL — predniSONE 5 MG TABS: 5 | 6 days supply | Qty: 21 | Fill #0

## 2017-04-07 DIAGNOSIS — M545 Low back pain: Secondary | ICD-10-CM | POA: Diagnosis not present

## 2017-04-07 MED FILL — HYDROCODON-APAP 5-325: 5-325 | 7 days supply | Qty: 40 | Fill #0

## 2017-04-25 ENCOUNTER — Other Ambulatory Visit (HOSPITAL_BASED_OUTPATIENT_CLINIC_OR_DEPARTMENT_OTHER): Payer: Self-pay | Admitting: Orthopaedic Surgery

## 2017-04-25 DIAGNOSIS — M545 Low back pain: Secondary | ICD-10-CM | POA: Diagnosis not present

## 2017-04-26 ENCOUNTER — Ambulatory Visit (HOSPITAL_BASED_OUTPATIENT_CLINIC_OR_DEPARTMENT_OTHER)
Admission: RE | Admit: 2017-04-26 | Discharge: 2017-04-26 | Disposition: A | Discharge status: Home / Self Care | Payer: Medicare Other | Source: Ambulatory Visit | Attending: Orthopaedic Surgery | Admitting: Orthopaedic Surgery

## 2017-04-26 DIAGNOSIS — M5136 Other intervertebral disc degeneration, lumbar region: Secondary | ICD-10-CM | POA: Diagnosis not present

## 2017-04-26 DIAGNOSIS — M545 Low back pain: Secondary | ICD-10-CM | POA: Diagnosis not present

## 2017-04-30 DIAGNOSIS — M5416 Radiculopathy, lumbar region: Secondary | ICD-10-CM | POA: Diagnosis not present

## 2017-05-12 DIAGNOSIS — M545 Low back pain: Secondary | ICD-10-CM | POA: Diagnosis not present

## 2017-05-12 MED FILL — traMADol HCL 50 MG TABS: 50 | 7 days supply | Qty: 30 | Fill #0

## 2017-05-14 ENCOUNTER — Other Ambulatory Visit: Payer: Self-pay

## 2017-05-14 ENCOUNTER — Ambulatory Visit: Payer: Medicare Other | Attending: Orthopaedic Surgery | Admitting: Physical Therapy

## 2017-05-14 ENCOUNTER — Telehealth: Payer: Self-pay | Admitting: Family Medicine

## 2017-05-14 DIAGNOSIS — M6281 Muscle weakness (generalized): Secondary | ICD-10-CM | POA: Diagnosis not present

## 2017-05-14 DIAGNOSIS — M545 Low back pain, unspecified: Secondary | ICD-10-CM

## 2017-05-14 DIAGNOSIS — R29898 Other symptoms and signs involving the musculoskeletal system: Secondary | ICD-10-CM

## 2017-05-14 NOTE — Therapy (Signed)
[facility] [address]  [place], [place], [address] Phone: [phone]   Fax:  [phone]  Physical Therapy Evaluation  Patient Details  Name: Nicole Brennan MRN: [id number] Date of Birth: Oct 14, 1940 Referring Provider: Dr. Rhona Brennan   Encounter Date: 05/14/2017  PT End of Session - 05/14/17 1431    Visit Number  1    Number of Visits  12    Date for PT Re-Evaluation  06/25/17    Authorization Type  Medicare    PT Start Time  0842    PT Stop Time  0940    PT Time Calculation (min)  58 min    Activity Tolerance  Patient tolerated treatment well    Behavior During Therapy  [facility] for tasks assessed/performed       Past Medical History:  Diagnosis Date  . Allergy   . Arthritis   . Cataract   . Thyroid disease     Past Surgical History:  Procedure Laterality Date  . DG  BONE DENSITY ([place] HX)    . KNEE SURGERY    . TUBAL LIGATION      There were no vitals filed for this visit.   Subjective Assessment - 05/14/17 0843    Subjective  in January seen by PCP - thought was having muscle spasms. Seen by ortho MD - has been getting cortisone injections. Walking is painful as well as daily activities and general mobility. Mornings are more difficult. Having another injection on 05/22/17. Having achiness in B HS and posterior knee and calf regions. No N&T. Denies bowel and bladder involvement.     Diagnostic tests  MRI: 1. Partially visualized diffuse marrow edema in the right sacral ala consistent with fracture. 2. Multilevel lumbar disc and facet degeneration resulting in up to mild lateral recess and neural foraminal narrowing as above. No high-grade stenosis.    Patient Stated Goals  improve pain    Currently in Pain?  Yes    Pain Score  8     Pain Location  Back    Pain Orientation  Lower;Right;Left    Pain Descriptors / Indicators  Aching    Pain Type  Acute pain    Pain Onset  More than a month ago    Pain  Frequency  Constant    Aggravating Factors   walking    Pain Relieving Factors  pain medication, pain patches, lying down, sitting, massage         [facility] PT Assessment - 05/14/17 0853      Assessment   Medical Diagnosis  Low Back pain    Referring Provider  Dr. Rhona Brennan    Onset Date/Surgical Date  -- January 2019    Next MD Visit  -- 05/22/17    Prior Therapy  yes - not for this issue      Precautions   Precautions  None      Restrictions   Weight Bearing Restrictions  No      Balance Screen   Has the patient fallen in the past 6 months  No    Has the patient had a decrease in activity level because of a fear of falling?   No    Is the patient reluctant to leave their home because of a fear of falling?   No      Home Film/video editor residence    Living Arrangements  Spouse/significant other  Prior Function   Level of Independence  Independent    Vocation Requirements  professional piano       Cognition   Overall Cognitive Status  Within Functional Limits for tasks assessed      Sensation   Light Touch  Appears Intact      Coordination   Gross Motor Movements are Fluid and Coordinated  No likey due to pain      Posture/Postural Control   Posture/Postural Control  Postural limitations    Postural Limitations  Rounded Shoulders;Forward head      ROM / Strength   AROM / PROM / Strength  AROM;Strength      AROM   Overall AROM Comments  deferred other motions due to limited tolerance for motion    AROM Assessment Site  Lumbar    Lumbar Flexion  flexed knees - pain - causing patient to sit back in chair before rising back up      Strength   Strength Assessment Site  Hip;Knee    Right/Left Hip  Right;Left    Right Hip Flexion  4-/5 pain at L low back    Left Hip Flexion  4/5    Right/Left Knee  Right;Left    Right Knee Flexion  4/5    Right Knee Extension  4+/5    Left Knee Flexion  4-/5    Left Knee Extension  4-/5       Palpation   Palpation comment  sharp increase in pain with light palpation to B glute region             Objective measurements completed on examination: See above findings.      [facility] Adult PT Treatment/Exercise - 05/14/17 0853      Modalities   Modalities  Ultrasound;Moist Heat;Electrical Stimulation      Moist Heat Therapy   Number Minutes Moist Heat  20 Minutes    Moist Heat Location  Lumbar Spine      Electrical Stimulation   Electrical Stimulation Location  B low back    Electrical Stimulation Action  IFC    Electrical Stimulation Parameters  to tolerance    Electrical Stimulation Goals  Pain;Tone      Ultrasound   Ultrasound Location  B lateral sacral area    Ultrasound Parameters  1.0 mHz, 20% duty cycle, 1.5 w/cm2    Ultrasound Goals  Pain             PT Education - 05/14/17 1431    Education provided  Yes    Education Details  exam findings, POC, self massage with ball    Person(s) Educated  Patient    Methods  Explanation;Demonstration    Comprehension  Verbalized understanding          PT Long Term Goals - 05/14/17 1439      PT LONG TERM GOAL #1   Title  patient to be independent with advanced HEP    Status  New    Target Date  06/25/17      PT LONG TERM GOAL #2   Title  patient to demonstrate normal lumbar AROM in all planes with pain no greater than 2/10    Status  New    Target Date  06/25/17      PT LONG TERM GOAL #3   Title  patient to demonstrate B LE strength of >/= 4+/5 without pain provocation    Status  New    Target Date  06/25/17  PT LONG TERM GOAL #4   Title  patient to report ability to perform ADLs and leisure activities with pain no greater than 2/10    Status  New    Target Date  06/25/17             Plan - 05/14/17 1432    Clinical Impression Statement  Nicole Brennan is a pleasant 77 y/o female with recent onset of low back pain with no known mechanism of injury. Patient today with limited tolerance to  all activity and functional motions at low back - gait also demonstrating redcued mechanics with jagged stepping pattern with wide BOS. Patient with little relief from pain medications and cortisone injections - scheduled for another injection on 3/14 with hopeful pain relief. Trial today of modalities for pain modulation due to poor tolerance for motion/therapeutic exercise. Will plan to follow up with patient next week for hopeful continued pain relief. Patient to benefit from PT to address pain and functional mobility limitations.     Clinical Presentation  Stable    Clinical Decision Making  Low    Rehab Potential  Good    PT Frequency  2x / week    PT Duration  6 weeks    PT Treatment/Interventions  ADLs/Self Care Home Management;Cryotherapy;Electrical Stimulation;Iontophoresis 4mg /ml Dexamethasone;Moist Heat;Traction;Therapeutic exercise;Therapeutic activities;Functional mobility training;Ultrasound;Neuromuscular re-education;Patient/family education;Manual techniques;Vasopneumatic Device;Taping;Dry needling;Passive range of motion    Consulted and Agree with Plan of Care  Patient       Patient will benefit from skilled therapeutic intervention in order to improve the following deficits and impairments:  Abnormal gait, Decreased activity tolerance, Decreased range of motion, Decreased mobility, Difficulty walking, Pain, Decreased strength  Visit Diagnosis: Acute bilateral low back pain without sciatica  Muscle weakness (generalized)  Other symptoms and signs involving the musculoskeletal system     Problem List Patient Active Problem List   Diagnosis Date Noted  . Seasonal allergies 04/21/2012  . Cataract 04/21/2012  . Osteoporosis 05/01/2011  . Hypothyroidism 04/30/2011  . Osteoarthritis 04/30/2011    [name], PT, DPT 05/14/17 2:44 PM   [facility] [address]  [place], [place], [address] Phone:  [phone]   Fax:  [phone]  Name: Nicole Brennan MRN: [id number] Date of Birth: 1940-12-18

## 2017-05-14 NOTE — Telephone Encounter (Signed)
Copied from [place]. Topic: Quick Communication - See Telephone Encounter >> May 14, 2017  1:44 PM Bea Graff, NT wrote: CRM for notification. See Telephone encounter for: Pts spouse, Rachel Rison calling and wants to see if Dr. Lorelei Pont can give him or his wife a call to see what she thinks about the situation his wife is in. He states that the pt was seen with Dr. Lorelei Pont for back pain in January and after that appt her back pain got worse and pt went to Dr. Tollie Pizza office and they did an xray and gave her injection in her back. This did not help and the pain got worse. PT went back to Dr. Tollie Pizza office and at that appt they did an MRI and stated it didn't look so bad but did notice a small bulge on her one of the disc that might be pressing on a nerve and that time they gave her a more localized injection for the pain. Husband states that he pain got really bad after that and now pt tears up anytime she moves and can barley walk. Also the pain after the second injection shifted from the right side to the left side. She went to physical therapy today for the pain and still no relief.  He states they would like advice from Dr. Lorelei Pont on what they should do to help her pain and if there is something else they should be doing. Can call either him or his wife.  05/14/17.

## 2017-05-14 NOTE — Telephone Encounter (Signed)
FYI

## 2017-05-15 NOTE — Telephone Encounter (Signed)
I called her and we discussed her recnet issues with her back.  Dr Latanya Maudlin recently gave her an rx for tramadol which does help with her pain, esp when combined with tylenol.  She had an MRI. Has had 2 ?epidural steroid injections.  Counseled her that I am happy to see her and try to help as best as I can.  She plans to see me next week

## 2017-05-18 NOTE — Progress Notes (Signed)
[facility] [place], [place], [place] [address] [phone]  Date:  05/19/2017   Name:  Nicole Brennan   DOB:  28-Nov-1940   MRN:  [id number]  PCP:  Darreld Mclean, MD    Chief Complaint: Back Pain (c/o worsening back pain since last visit. )   History of Present Illness:  Nicole Brennan is a 77 y.o. very pleasant female patient who presents with the following:  Here today to discuss back pain- we last discussed this in January of this year:  She noted some muscle spasms in her right lower back last week She is still very active for her age, but has noted more trouble getting up and down recently She did have muscle spasms many years ago- she did take a muscle relaxer years ago that helped her  She also has noted some possible pain in the right hip joint She has not noted any change in her gait No injury that she can really recall  She has tried some tylenol so far- it seemed to help her some  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     We got hip films for her in January as well: IMPRESSION: Symmetric narrowing both hip joints. 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 No fracture or dislocation. 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  Her back pain is being treated by [facility]- it seems she just wanted to go over her care plan to day and make sure that I think she is on the right track She also just saw PT for evaluation 3/6, and did a session today PT is doing heat, exercises.  She is getting her 2nd cortisone shot with [facility] this week   Offer pneumovax 23 today- however will defer as she is getting steroid shots now   She did have an MRI last month- it showed a sacral fracture that she was not aware of   Also diffuse lumbar disease She has some tramadol from [place] and is taking tylenol- this regimen does seem to help She is using an OTC pain patch that has lidocaine and Aspercreme  She is using tramadol 50 mg every 5-6 hours  She stopped using robaxin as it was not helping  She was on gabapentin a year or so ago, but no longer  She has 3 more apps with PT coming up   Patient Active Problem List   Diagnosis Date Noted  . Seasonal allergies 04/21/2012  . Cataract 04/21/2012  . Osteoporosis 05/01/2011  . Hypothyroidism 04/30/2011  . Osteoarthritis 04/30/2011    Past Medical History:  Diagnosis Date  . Allergy   . Arthritis   . Cataract   . Thyroid disease     Past Surgical History:  Procedure Laterality Date  . DG  BONE DENSITY ([place] HX)    . KNEE SURGERY    . TUBAL LIGATION      Social History   Tobacco Use  . Smoking status: Never Smoker  . Smokeless tobacco: Never Used  Substance Use Topics  . Alcohol use: No  . Drug use: No    Family History  Problem Relation Age of Onset  . Cancer Mother   .  Vascular Disease Mother   . Dementia Father     Allergies  Allergen Reactions  . Penicillins Hives    Medication list has been reviewed and updated.  Current Outpatient Medications on File Prior to Visit  Medication Sig Dispense Refill  . calcium carbonate 200 MG capsule Take 250 mg by mouth 2 (two) times daily with a meal.    . Carboxymethylcellulose Sodium (THERATEARS OP) Apply to eye.    . Glycerin-Polysorbate 80 (REFRESH DRY EYE THERAPY OP) Apply 1 drop to eye as needed.    Marland Kitchen levothyroxine (SYNTHROID, LEVOTHROID) 75 MCG tablet Take 1 tablet daily 90 tablet 3  . methocarbamol (ROBAXIN) 500 MG tablet Take 1 tablet (500 mg total) by mouth every 8 (eight) hours as needed for muscle spasms. 30 tablet 0  . Multiple Vitamins-Minerals (MULTIVITAMIN WITH MINERALS) tablet Take 1 tablet by mouth daily.    . Turmeric 450 MG CAPS Take 1 capsule by mouth daily.     Marland Kitchen acetaminophen (TYLENOL) 500 MG tablet Take 1,000 mg by mouth.    . traMADol (ULTRAM) 50 MG tablet Take 1 tablet by mouth as needed.  0   No current facility-administered medications on file prior to visit.     Review of Systems:  As per HPI- otherwise negative.   Physical Examination: Vitals:   05/19/17 1229  BP: 112/78  Pulse: 78  Temp: (!) 97.4 F (36.3 C)  SpO2: 98%   Vitals:   05/19/17 1229  Weight: 188 lb 6.4 oz (85.5 kg)  Height: 5\' 5"  (1.651 m)   Body mass index is 31.35 kg/m. Ideal Body Weight: Weight in (lb) to have BMI = 25: 149.9  GEN: WDWN, NAD, Non-toxic, A & O x 3, overweight, otherwise looks well HEENT: Atraumatic, Normocephalic. Neck supple. No masses, No LAD. Ears and Nose: No external deformity. CV: RRR, No M/G/R. No JVD. No thrill. No extra heart sounds. PULM: CTA B, no wheezes, crackles, rhonchi. No retractions. No resp. distress. No accessory muscle use. EXTR: No c/c/e NEURO Normal gait.  PSYCH: Normally interactive. Conversant. Not depressed or anxious appearing.  Calm demeanor.  Able to toe raise bilaterally  Lumbar flexion and extension are slightly restricted due to pain She does not have any tenderness of the TL spine or surrounding muscles Mild tenderness over the right sciatic notch    Assessment and Plan: Lumbar disc disease  Screening for breast cancer - Plan: MM SCREENING BREAST TOMO BILATERAL  Spasm of muscle of lower back  Went over her MRI to the best of my ability.  Let her know that I defer to the management of her spine specialist and PM and R doc in this regard.  However her current treatment plan seems reasonable to me Ordered mammogram for her today   Signed Lamar Blinks, MD

## 2017-05-19 ENCOUNTER — Ambulatory Visit (INDEPENDENT_AMBULATORY_CARE_PROVIDER_SITE_OTHER): Payer: Medicare Other | Admitting: Family Medicine

## 2017-05-19 ENCOUNTER — Encounter: Payer: Self-pay | Admitting: Family Medicine

## 2017-05-19 ENCOUNTER — Ambulatory Visit: Payer: Medicare Other | Admitting: Physical Therapy

## 2017-05-19 ENCOUNTER — Encounter: Payer: Self-pay | Admitting: Physical Therapy

## 2017-05-19 VITALS — BP 112/78 | HR 78 | Temp 97.4°F | Ht 65.0 in | Wt 188.4 lb

## 2017-05-19 DIAGNOSIS — R29898 Other symptoms and signs involving the musculoskeletal system: Secondary | ICD-10-CM

## 2017-05-19 DIAGNOSIS — M545 Low back pain, unspecified: Secondary | ICD-10-CM

## 2017-05-19 DIAGNOSIS — Z1231 Encounter for screening mammogram for malignant neoplasm of breast: Secondary | ICD-10-CM | POA: Diagnosis not present

## 2017-05-19 DIAGNOSIS — M6281 Muscle weakness (generalized): Secondary | ICD-10-CM

## 2017-05-19 DIAGNOSIS — M519 Unspecified thoracic, thoracolumbar and lumbosacral intervertebral disc disorder: Secondary | ICD-10-CM

## 2017-05-19 DIAGNOSIS — M62830 Muscle spasm of back: Secondary | ICD-10-CM | POA: Diagnosis not present

## 2017-05-19 DIAGNOSIS — Z1239 Encounter for other screening for malignant neoplasm of breast: Secondary | ICD-10-CM

## 2017-05-19 NOTE — Therapy (Signed)
[facility] [address]  [place], [place], [address] Phone: [phone]   Fax:  [phone]  Physical Therapy Treatment  Patient Details  Name: Nicole Brennan MRN: [id number] Date of Birth: Jun 06, 1940 Referring Provider: Dr. Rhona Raider   Encounter Date: 05/19/2017  PT End of Session - 05/19/17 1012    Visit Number  2    Number of Visits  12    Date for PT Re-Evaluation  06/25/17    Authorization Type  Medicare    PT Start Time  1011    PT Stop Time  1116    PT Time Calculation (min)  65 min    Activity Tolerance  Patient tolerated treatment well    Behavior During Therapy  [facility] for tasks assessed/performed       Past Medical History:  Diagnosis Date  . Allergy   . Arthritis   . Cataract   . Thyroid disease     Past Surgical History:  Procedure Laterality Date  . DG  BONE DENSITY ([place] HX)    . KNEE SURGERY    . TUBAL LIGATION      There were no vitals filed for this visit.  Subjective Assessment - 05/19/17 1012    Subjective  no pain meds since 6 am - hard to say if last visit helped at all.     Diagnostic tests  MRI: 1. Partially visualized diffuse marrow edema in the right sacral ala consistent with fracture. 2. Multilevel lumbar disc and facet degeneration resulting in up to mild lateral recess and neural foraminal narrowing as above. No high-grade stenosis.    Patient Stated Goals  improve pain    Currently in Pain?  Yes    Pain Score  7     Pain Location  Back    Pain Orientation  Right;Left;Lower    Pain Descriptors / Indicators  Aching;Sore;Discomfort    Pain Type  Acute pain                      [facility] Adult PT Treatment/Exercise - 05/19/17 0001      Exercises   Exercises  Lumbar      Lumbar Exercises: Supine   Clam  10 reps B - single leg fallouts    Bridge  10 reps    Straight Leg Raise  10 reps B    Other Supine Lumbar Exercises  hip adduction ball squeeze x 10      Modalities   Modalities  Electrical Stimulation;Moist Heat      Moist Heat Therapy   Number Minutes Moist Heat  20 Minutes    Moist Heat Location  Lumbar Spine      Electrical Stimulation   Electrical Stimulation Location  B low back    Electrical Stimulation Action  IFC    Electrical Stimulation Parameters  to tlerance    Electrical Stimulation Goals  Pain;Tone      Manual Therapy   Manual Therapy  Passive ROM    Manual therapy comments  patient supine    Passive ROM  PROM B hips all planes - stretching at end ranges of motion - L hip wiht greater mobility compared to R                  PT Long Term Goals - 05/19/17 1013      PT LONG TERM GOAL #1   Title  patient to be independent with  advanced HEP    Status  On-going      PT LONG TERM GOAL #2   Title  patient to demonstrate normal lumbar AROM in all planes with pain no greater than 2/10    Status  On-going      PT LONG TERM GOAL #3   Title  patient to demonstrate B LE strength of >/= 4+/5 without pain provocation    Status  On-going      PT LONG TERM GOAL #4   Title  patient to report ability to perform ADLs and leisure activities with pain no greater than 2/10    Status  On-going            Plan - 05/19/17 1013    Clinical Impression Statement  patient continuing to have realtively high levels of pain - plans to see PCP today. Tolerable to B hip PROM + stretching today with R hip demonstrating less mobility than L. Did attempts proximal hip activation/strengthening to tolerance with no increase in pain. Estim/moist heat to end session as she believes she had some relief from this at last session.     PT Treatment/Interventions  ADLs/Self Care Home Management;Cryotherapy;Electrical Stimulation;Iontophoresis 4mg /ml Dexamethasone;Moist Heat;Traction;Therapeutic exercise;Therapeutic activities;Functional mobility training;Ultrasound;Neuromuscular re-education;Patient/family education;Manual  techniques;Vasopneumatic Device;Taping;Dry needling;Passive range of motion    Consulted and Agree with Plan of Care  Patient       Patient will benefit from skilled therapeutic intervention in order to improve the following deficits and impairments:  Abnormal gait, Decreased activity tolerance, Decreased range of motion, Decreased mobility, Difficulty walking, Pain, Decreased strength  Visit Diagnosis: Acute bilateral low back pain without sciatica  Muscle weakness (generalized)  Other symptoms and signs involving the musculoskeletal system     Problem List Patient Active Problem List   Diagnosis Date Noted  . Seasonal allergies 04/21/2012  . Cataract 04/21/2012  . Osteoporosis 05/01/2011  . Hypothyroidism 04/30/2011  . Osteoarthritis 04/30/2011     [name], PT, DPT 05/19/17 11:20 AM   [facility] [address]  [place], [place], [address] Phone: [phone]   Fax:  [phone]  Name: Nicole Brennan MRN: [id number] Date of Birth: Oct 29, 1940

## 2017-05-19 NOTE — Patient Instructions (Signed)
Knee to Chest   Lying supine, bend involved knee to chest __5-10_ times. Repeat with other leg. Do _2__ times per day.  Bridge   Lie back, legs bent. Inhale, pressing hips up. Keeping ribs in, lengthen lower back. Exhale, rolling down along spine from top. Repeat __10__ times. Do ___1-2_ sessions per day.  Lumbar Rotation (Non-Weight Bearing)   Feet on floor, slowly rock knees from side to side in small, pain-free range of motion. Allow lower back to rotate slightly. Repeat __5-10__ times per set. Do _2_ sets per session.   Strengthening: Straight Leg Raise (Phase 1)   Tighten muscles on front of right thigh, then lift leg __~8__ inches from surface, keeping knee locked.  Repeat __10-15__ times per set.  Do __1-2__ sessions per day.  Piriformis Stretch   Lying on back, pull right knee toward opposite shoulder. Hold _15-30___ seconds. Repeat _3___ times. Do __2__ sessions per day.

## 2017-05-19 NOTE — Patient Instructions (Signed)
I ordered your mammogram for you- you can stop in downstairs and schedule this today if you like.    I would like to update your pneumonia vaccine once you are done with your steroid shots You can use a stool softener as needed for constipation   I think using your combination of tylenol and tramadol is ok!

## 2017-05-21 DIAGNOSIS — M533 Sacrococcygeal disorders, not elsewhere classified: Secondary | ICD-10-CM | POA: Diagnosis not present

## 2017-05-23 ENCOUNTER — Encounter: Payer: Self-pay | Admitting: Physical Therapy

## 2017-05-23 ENCOUNTER — Ambulatory Visit: Payer: Medicare Other | Admitting: Physical Therapy

## 2017-05-23 DIAGNOSIS — R29898 Other symptoms and signs involving the musculoskeletal system: Secondary | ICD-10-CM

## 2017-05-23 DIAGNOSIS — M6281 Muscle weakness (generalized): Secondary | ICD-10-CM | POA: Diagnosis not present

## 2017-05-23 DIAGNOSIS — M545 Low back pain, unspecified: Secondary | ICD-10-CM

## 2017-05-23 NOTE — Therapy (Signed)
[facility] [address]  [place], [place], [address] Phone: [phone]   Fax:  [phone]  Physical Therapy Treatment  Patient Details  Name: Nicole Brennan MRN: [id number] Date of Birth: 05-03-1940 Referring Provider: Dr. Rhona Raider   Encounter Date: 05/23/2017  PT End of Session - 05/23/17 0845    Visit Number  3    Number of Visits  12    Date for PT Re-Evaluation  06/25/17    Authorization Type  Medicare    PT Start Time  6962    PT Stop Time  0937    PT Time Calculation (min)  53 min    Activity Tolerance  Patient tolerated treatment well    Behavior During Therapy  [facility] for tasks assessed/performed       Past Medical History:  Diagnosis Date  . Allergy   . Arthritis   . Cataract   . Thyroid disease     Past Surgical History:  Procedure Laterality Date  . DG  BONE DENSITY ([place] HX)    . KNEE SURGERY    . TUBAL LIGATION      There were no vitals filed for this visit.  Subjective Assessment - 05/23/17 0845    Subjective  had injection on Wednesday - pain reduced some today    Diagnostic tests  MRI: 1. Partially visualized diffuse marrow edema in the right sacral ala consistent with fracture. 2. Multilevel lumbar disc and facet degeneration resulting in up to mild lateral recess and neural foraminal narrowing as above. No high-grade stenosis.    Patient Stated Goals  improve pain    Currently in Pain?  Yes    Pain Score  5     Pain Location  Back    Pain Orientation  Right;Left;Lower    Pain Type  Acute pain                      [facility] Adult PT Treatment/Exercise - 05/23/17 0001      Exercises   Exercises  Lumbar      Lumbar Exercises: Supine   Bent Knee Raise  10 reps    Bridge  10 reps    Straight Leg Raise  10 reps      Modalities   Modalities  Electrical Stimulation;Moist Heat      Moist Heat Therapy   Number Minutes Moist Heat  20 Minutes    Moist Heat Location   Lumbar Spine      Electrical Stimulation   Electrical Stimulation Location  B low back    Electrical Stimulation Action  IFC    Electrical Stimulation Parameters  to tolerance    Electrical Stimulation Goals  Pain;Tone      Manual Therapy   Manual Therapy  Passive ROM    Manual therapy comments  patient supine    Passive ROM  PROM B hips into all planes - stretching at end ranges of motion with imptoved mobility from last session                  PT Long Term Goals - 05/19/17 1013      PT LONG TERM GOAL #1   Title  patient to be independent with advanced HEP    Status  On-going      PT LONG TERM GOAL #2   Title  patient to demonstrate normal lumbar AROM in all planes with pain no greater than  2/10    Status  On-going      PT LONG TERM GOAL #3   Title  patient to demonstrate B LE strength of >/= 4+/5 without pain provocation    Status  On-going      PT LONG TERM GOAL #4   Title  patient to report ability to perform ADLs and leisure activities with pain no greater than 2/10    Status  On-going            Plan - 05/23/17 0846    Clinical Impression Statement  Patient with injection of L side low back on Wednesday - pain some improved today as well as noted improvements in gait with less staggering. Patient tolerable to stretching at B hip and low back today with no issue - but dues report inability to perform HEP due to pain tolerance at home. Will continue to progress as able.     PT Treatment/Interventions  ADLs/Self Care Home Management;Cryotherapy;Electrical Stimulation;Iontophoresis 4mg /ml Dexamethasone;Moist Heat;Traction;Therapeutic exercise;Therapeutic activities;Functional mobility training;Ultrasound;Neuromuscular re-education;Patient/family education;Manual techniques;Vasopneumatic Device;Taping;Dry needling;Passive range of motion    Consulted and Agree with Plan of Care  Patient       Patient will benefit from skilled therapeutic intervention in  order to improve the following deficits and impairments:  Abnormal gait, Decreased activity tolerance, Decreased range of motion, Decreased mobility, Difficulty walking, Pain, Decreased strength  Visit Diagnosis: Acute bilateral low back pain without sciatica  Muscle weakness (generalized)  Other symptoms and signs involving the musculoskeletal system     Problem List Patient Active Problem List   Diagnosis Date Noted  . Seasonal allergies 04/21/2012  . Cataract 04/21/2012  . Osteoporosis 05/01/2011  . Hypothyroidism 04/30/2011  . Osteoarthritis 04/30/2011     [name], PT, DPT 05/23/17 9:48 AM   [facility] [address]  [place], [place], [address] Phone: [phone]   Fax:  [phone]  Name: JENNINGS STIRLING MRN: [id number] Date of Birth: 1940/05/16

## 2017-05-26 ENCOUNTER — Ambulatory Visit: Payer: Medicare Other | Admitting: Physical Therapy

## 2017-05-26 ENCOUNTER — Encounter: Payer: Self-pay | Admitting: Physical Therapy

## 2017-05-26 DIAGNOSIS — R29898 Other symptoms and signs involving the musculoskeletal system: Secondary | ICD-10-CM | POA: Diagnosis not present

## 2017-05-26 DIAGNOSIS — M545 Low back pain, unspecified: Secondary | ICD-10-CM

## 2017-05-26 DIAGNOSIS — M6281 Muscle weakness (generalized): Secondary | ICD-10-CM

## 2017-05-26 NOTE — Therapy (Signed)
[facility] [address]  [place], [place], [address] Phone: [phone]   Fax:  [phone]  Physical Therapy Treatment  Patient Details  Name: Nicole Brennan MRN: [id number] Date of Birth: Dec 14, 1940 Referring Provider: Dr. Rhona Raider   Encounter Date: 05/26/2017  PT End of Session - 05/26/17 1012    Visit Number  4    Number of Visits  12    Date for PT Re-Evaluation  06/25/17    Authorization Type  Medicare    PT Start Time  1005    PT Stop Time  1107    PT Time Calculation (min)  62 min    Activity Tolerance  Patient tolerated treatment well    Behavior During Therapy  [facility] for tasks assessed/performed       Past Medical History:  Diagnosis Date  . Allergy   . Arthritis   . Cataract   . Thyroid disease     Past Surgical History:  Procedure Laterality Date  . DG  BONE DENSITY ([place] HX)    . KNEE SURGERY    . TUBAL LIGATION      There were no vitals filed for this visit.  Subjective Assessment - 05/26/17 1009    Subjective  doing well - walking better - feels like pain is getting better    Diagnostic tests  MRI: 1. Partially visualized diffuse marrow edema in the right sacral ala consistent with fracture. 2. Multilevel lumbar disc and facet degeneration resulting in up to mild lateral recess and neural foraminal narrowing as above. No high-grade stenosis.    Patient Stated Goals  improve pain    Currently in Pain?  Yes    Pain Score  4     Pain Location  Back    Pain Orientation  Right;Left;Lower    Pain Descriptors / Indicators  Aching;Sore    Pain Type  Acute pain                      [facility] Adult PT Treatment/Exercise - 05/26/17 1013      Lumbar Exercises: Aerobic   Nustep  L4 x 7 min      Lumbar Exercises: Seated   Other Seated Lumbar Exercises  red ball rollouts - fwd/R/L - 5 x 5 each    Other Seated Lumbar Exercises  B HS curls - red tband x 15 reps      Lumbar  Exercises: Supine   Isometric Hip Flexion  10 reps feet resting on peanut ball      Lumbar Exercises: Sidelying   Clam  Right;Left;10 reps red tband - 2 sets      Modalities   Modalities  Electrical Stimulation;Moist Heat      Moist Heat Therapy   Number Minutes Moist Heat  15 Minutes    Moist Heat Location  Lumbar Spine      Electrical Stimulation   Electrical Stimulation Location  B low back    Electrical Stimulation Action  IFC    Electrical Stimulation Parameters  to tolerance    Electrical Stimulation Goals  Pain;Tone      Manual Therapy   Manual Therapy  Soft tissue mobilization    Manual therapy comments  patient sidelying    Soft tissue mobilization  STM to R glute region                  PT Long Term Goals - 05/19/17 1013  PT LONG TERM GOAL #1   Title  patient to be independent with advanced HEP    Status  On-going      PT LONG TERM GOAL #2   Title  patient to demonstrate normal lumbar AROM in all planes with pain no greater than 2/10    Status  On-going      PT LONG TERM GOAL #3   Title  patient to demonstrate B LE strength of >/= 4+/5 without pain provocation    Status  On-going      PT LONG TERM GOAL #4   Title  patient to report ability to perform ADLs and leisure activities with pain no greater than 2/10    Status  On-going            Plan - 05/26/17 1012    Clinical Impression Statement  Much improved gait pattern today with apteint seemingly in better spirits since last session. Tolerable to more activation/strengthening type activites today without increased pain. Does have a considerable amount of pain even to light touch at R sacral area. Will continue to progress as patient tolerates.     PT Treatment/Interventions  ADLs/Self Care Home Management;Cryotherapy;Electrical Stimulation;Iontophoresis 4mg /ml Dexamethasone;Moist Heat;Traction;Therapeutic exercise;Therapeutic activities;Functional mobility  training;Ultrasound;Neuromuscular re-education;Patient/family education;Manual techniques;Vasopneumatic Device;Taping;Dry needling;Passive range of motion    Consulted and Agree with Plan of Care  Patient       Patient will benefit from skilled therapeutic intervention in order to improve the following deficits and impairments:  Abnormal gait, Decreased activity tolerance, Decreased range of motion, Decreased mobility, Difficulty walking, Pain, Decreased strength  Visit Diagnosis: Acute bilateral low back pain without sciatica  Muscle weakness (generalized)  Other symptoms and signs involving the musculoskeletal system     Problem List Patient Active Problem List   Diagnosis Date Noted  . Seasonal allergies 04/21/2012  . Cataract 04/21/2012  . Osteoporosis 05/01/2011  . Hypothyroidism 04/30/2011  . Osteoarthritis 04/30/2011     [name], PT, DPT 05/26/17 11:08 AM   [facility] [address]  [place], [place], [address] Phone: [phone]   Fax:  [phone]  Name: RANA HOCHSTEIN MRN: [id number] Date of Birth: [birth year]

## 2017-05-28 ENCOUNTER — Ambulatory Visit: Payer: Medicare Other | Admitting: Physical Therapy

## 2017-05-28 ENCOUNTER — Ambulatory Visit (HOSPITAL_BASED_OUTPATIENT_CLINIC_OR_DEPARTMENT_OTHER)
Admission: RE | Admit: 2017-05-28 | Discharge: 2017-05-28 | Disposition: A | Discharge status: Home / Self Care | Payer: Medicare Other | Source: Ambulatory Visit | Attending: Family Medicine | Admitting: Family Medicine

## 2017-05-28 ENCOUNTER — Encounter: Payer: Self-pay | Admitting: Physical Therapy

## 2017-05-28 DIAGNOSIS — Z1231 Encounter for screening mammogram for malignant neoplasm of breast: Secondary | ICD-10-CM | POA: Insufficient documentation

## 2017-05-28 DIAGNOSIS — M545 Low back pain, unspecified: Secondary | ICD-10-CM

## 2017-05-28 DIAGNOSIS — R29898 Other symptoms and signs involving the musculoskeletal system: Secondary | ICD-10-CM | POA: Diagnosis not present

## 2017-05-28 DIAGNOSIS — Z1239 Encounter for other screening for malignant neoplasm of breast: Secondary | ICD-10-CM

## 2017-05-28 DIAGNOSIS — M6281 Muscle weakness (generalized): Secondary | ICD-10-CM

## 2017-05-28 NOTE — Therapy (Signed)
[facility] [address]  [place], [place], [address] Phone: [phone]   Fax:  [phone]  Physical Therapy Treatment  Patient Details  Name: Nicole Brennan MRN: [id number] Date of Birth: [birth year] Referring Provider: Dr. Rhona Raider   Encounter Date: 05/28/2017  PT End of Session - 05/28/17 1011    Visit Number  5    Number of Visits  12    Date for PT Re-Evaluation  06/25/17    Authorization Type  Medicare    PT Start Time  1005    PT Stop Time  1103    PT Time Calculation (min)  58 min    Activity Tolerance  Patient tolerated treatment well    Behavior During Therapy  [facility] for tasks assessed/performed       Past Medical History:  Diagnosis Date  . Allergy   . Arthritis   . Cataract   . Thyroid disease     Past Surgical History:  Procedure Laterality Date  . DG  BONE DENSITY ([place] HX)    . KNEE SURGERY    . TUBAL LIGATION      There were no vitals filed for this visit.  Subjective Assessment - 05/28/17 1009    Subjective  doing better - had some leg cramps early this morning    Diagnostic tests  MRI: 1. Partially visualized diffuse marrow edema in the right sacral ala consistent with fracture. 2. Multilevel lumbar disc and facet degeneration resulting in up to mild lateral recess and neural foraminal narrowing as above. No high-grade stenosis.    Patient Stated Goals  improve pain    Currently in Pain?  Yes    Pain Score  3     Pain Location  Back and L buttock    Pain Orientation  Right;Left;Lower    Pain Descriptors / Indicators  Aching;Sore    Pain Type  Acute pain                      [facility] Adult PT Treatment/Exercise - 05/28/17 1012      Lumbar Exercises: Aerobic   Nustep  L3 x 8 min      Lumbar Exercises: Machines for Strengthening   Cybex Knee Extension  B LE - 10# - 2 x 10    Cybex Knee Flexion  B LE - 15# - 2 x 10      Lumbar Exercises: Standing   Heel Raises  15  reps    Functional Squats  15 reps TRX - poor weight shift to L LE    Other Standing Lumbar Exercises  B side stepping x 20 feet each direction - yellow tband      Modalities   Modalities  Electrical Stimulation;Moist Heat      Moist Heat Therapy   Number Minutes Moist Heat  15 Minutes    Moist Heat Location  Lumbar Spine      Electrical Stimulation   Electrical Stimulation Location  B low back    Electrical Stimulation Action  IFC    Electrical Stimulation Parameters  to tolerance    Electrical Stimulation Goals  Pain;Tone      Manual Therapy   Manual therapy comments  self massage to L glute region                  PT Long Term Goals - 05/19/17 1013      PT LONG TERM GOAL #  1   Title  patient to be independent with advanced HEP    Status  On-going      PT LONG TERM GOAL #2   Title  patient to demonstrate normal lumbar AROM in all planes with pain no greater than 2/10    Status  On-going      PT LONG TERM GOAL #3   Title  patient to demonstrate B LE strength of >/= 4+/5 without pain provocation    Status  On-going      PT LONG TERM GOAL #4   Title  patient to report ability to perform ADLs and leisure activities with pain no greater than 2/10    Status  On-going            Plan - 05/28/17 1011    Clinical Impression Statement  Continued improvements in pain and gait mechiancs today - does report leg cramping that woke her this morning, but has since resolved. patient tolerable to strength training progressions today - all being done in other than supine positions. Continued estim and moist heat at end of session for pain control.     PT Treatment/Interventions  ADLs/Self Care Home Management;Cryotherapy;Electrical Stimulation;Iontophoresis 4mg /ml Dexamethasone;Moist Heat;Traction;Therapeutic exercise;Therapeutic activities;Functional mobility training;Ultrasound;Neuromuscular re-education;Patient/family education;Manual techniques;Vasopneumatic  Device;Taping;Dry needling;Passive range of motion    Consulted and Agree with Plan of Care  Patient       Patient will benefit from skilled therapeutic intervention in order to improve the following deficits and impairments:  Abnormal gait, Decreased activity tolerance, Decreased range of motion, Decreased mobility, Difficulty walking, Pain, Decreased strength  Visit Diagnosis: Acute bilateral low back pain without sciatica  Muscle weakness (generalized)  Other symptoms and signs involving the musculoskeletal system     Problem List Patient Active Problem List   Diagnosis Date Noted  . Seasonal allergies 04/21/2012  . Cataract 04/21/2012  . Osteoporosis 05/01/2011  . Hypothyroidism 04/30/2011  . Osteoarthritis 04/30/2011    [name], PT, DPT 05/28/17 11:41 AM   [facility] [address]  [place], [place], [address] Phone: [phone]   Fax:  [phone]  Name: Nicole Brennan MRN: [id number] Date of Birth: Jan 19, 1941

## 2017-06-03 ENCOUNTER — Encounter: Payer: Self-pay | Admitting: Physical Therapy

## 2017-06-03 ENCOUNTER — Ambulatory Visit: Payer: Medicare Other | Admitting: Physical Therapy

## 2017-06-03 DIAGNOSIS — R29898 Other symptoms and signs involving the musculoskeletal system: Secondary | ICD-10-CM

## 2017-06-03 DIAGNOSIS — M6281 Muscle weakness (generalized): Secondary | ICD-10-CM | POA: Diagnosis not present

## 2017-06-03 DIAGNOSIS — M545 Low back pain, unspecified: Secondary | ICD-10-CM

## 2017-06-03 NOTE — Therapy (Signed)
[facility] [address]  [place], [place], [address] Phone: [phone]   Fax:  [phone]  Physical Therapy Treatment  Patient Details  Name: Nicole Brennan MRN: [id number] Date of Birth: 1940/07/20 Referring Provider: Dr. Rhona Raider   Encounter Date: 06/03/2017  PT End of Session - 06/03/17 0844    Visit Number  6    Number of Visits  12    Date for PT Re-Evaluation  06/25/17    Authorization Type  Medicare    PT Start Time  0842    PT Stop Time  0944    PT Time Calculation (min)  62 min    Activity Tolerance  Patient tolerated treatment well    Behavior During Therapy  [facility] for tasks assessed/performed       Past Medical History:  Diagnosis Date  . Allergy   . Arthritis   . Cataract   . Thyroid disease     Past Surgical History:  Procedure Laterality Date  . DG  BONE DENSITY ([place] HX)    . KNEE SURGERY    . TUBAL LIGATION      There were no vitals filed for this visit.  Subjective Assessment - 06/03/17 0843    Subjective  sleeping and walking better; still taking some tylenol    Diagnostic tests  MRI: 1. Partially visualized diffuse marrow edema in the right sacral ala consistent with fracture. 2. Multilevel lumbar disc and facet degeneration resulting in up to mild lateral recess and neural foraminal narrowing as above. No high-grade stenosis.    Patient Stated Goals  improve pain    Currently in Pain?  Yes    Pain Score  3     Pain Location  Back    Pain Orientation  Right;Left;Lower    Pain Descriptors / Indicators  Aching;Sore    Pain Type  Acute pain                No data recorded       [facility] Adult PT Treatment/Exercise - 06/03/17 0846      Lumbar Exercises: Aerobic   Nustep  L5 x 8 min      Lumbar Exercises: Supine   Clam  10 reps red tband - single leg fallout    Bridge  10 reps red tband at knees    Isometric Hip Flexion  10 reps;5 seconds feet resting on peanut  ball - heavy VC/TC required    Other Supine Lumbar Exercises  HS bridge - poor tolerance - terminated      Modalities   Modalities  Electrical Stimulation;Moist Heat      Moist Heat Therapy   Number Minutes Moist Heat  15 Minutes    Moist Heat Location  Lumbar Spine      Electrical Stimulation   Electrical Stimulation Location  B low back    Electrical Stimulation Action  IFC    Electrical Stimulation Parameters  to tolerance    Electrical Stimulation Goals  Pain;Tone      Manual Therapy   Manual Therapy  Soft tissue mobilization;Passive ROM    Soft tissue mobilization  STM with ball to B glutes/piriformis region    Passive ROM  PROM of L hip into flexion and ER                   PT Long Term Goals - 05/19/17 1013      PT LONG TERM GOAL #  1   Title  patient to be independent with advanced HEP    Status  On-going      PT LONG TERM GOAL #2   Title  patient to demonstrate normal lumbar AROM in all planes with pain no greater than 2/10    Status  On-going      PT LONG TERM GOAL #3   Title  patient to demonstrate B LE strength of >/= 4+/5 without pain provocation    Status  On-going      PT LONG TERM GOAL #4   Title  patient to report ability to perform ADLs and leisure activities with pain no greater than 2/10    Status  On-going            Plan - 06/03/17 0845    Clinical Impression Statement  Ms. Stallsmith continues to report improvements in general pain and functional mobility. Does still have a considerable amount of soreness at glute region - good tolerance to STM. Patient also with some difficulty isoloating glute/HS/core musculature requiring heavy VC and TC to produce desired muscle activation.     PT Treatment/Interventions  ADLs/Self Care Home Management;Cryotherapy;Electrical Stimulation;Iontophoresis 4mg /ml Dexamethasone;Moist Heat;Traction;Therapeutic exercise;Therapeutic activities;Functional mobility training;Ultrasound;Neuromuscular  re-education;Patient/family education;Manual techniques;Vasopneumatic Device;Taping;Dry needling;Passive range of motion    Consulted and Agree with Plan of Care  Patient       Patient will benefit from skilled therapeutic intervention in order to improve the following deficits and impairments:  Abnormal gait, Decreased activity tolerance, Decreased range of motion, Decreased mobility, Difficulty walking, Pain, Decreased strength  Visit Diagnosis: Acute bilateral low back pain without sciatica  Muscle weakness (generalized)  Other symptoms and signs involving the musculoskeletal system     Problem List Patient Active Problem List   Diagnosis Date Noted  . Seasonal allergies 04/21/2012  . Cataract 04/21/2012  . Osteoporosis 05/01/2011  . Hypothyroidism 04/30/2011  . Osteoarthritis 04/30/2011     [name], PT, DPT 06/03/17 9:44 AM   [facility] [address]  [place], [place], [address] Phone: [phone]   Fax:  [phone]  Name: Nicole Brennan MRN: [id number] Date of Birth: 1940-09-28

## 2017-06-06 ENCOUNTER — Ambulatory Visit: Payer: Medicare Other | Admitting: Physical Therapy

## 2017-06-09 DIAGNOSIS — M545 Low back pain: Secondary | ICD-10-CM | POA: Diagnosis not present

## 2017-06-10 ENCOUNTER — Encounter: Payer: Self-pay | Admitting: Physical Therapy

## 2017-06-10 ENCOUNTER — Ambulatory Visit: Payer: Medicare Other | Attending: Orthopaedic Surgery | Admitting: Physical Therapy

## 2017-06-10 DIAGNOSIS — M6281 Muscle weakness (generalized): Secondary | ICD-10-CM | POA: Diagnosis not present

## 2017-06-10 DIAGNOSIS — R29898 Other symptoms and signs involving the musculoskeletal system: Secondary | ICD-10-CM | POA: Diagnosis not present

## 2017-06-10 DIAGNOSIS — M545 Low back pain, unspecified: Secondary | ICD-10-CM

## 2017-06-10 NOTE — Therapy (Signed)
[facility] [address]  [place], [place], [address] Phone: [phone]   Fax:  [phone]  Physical Therapy Treatment  Patient Details  Name: Nicole Brennan MRN: [id number] Date of Birth: 09-22-1940 Referring Provider: Dr. Rhona Raider   Encounter Date: 06/10/2017  PT End of Session - 06/10/17 1022    Visit Number  7    Number of Visits  12    Date for PT Re-Evaluation  06/25/17    Authorization Type  Medicare    PT Start Time  1016    PT Stop Time  1121    PT Time Calculation (min)  65 min    Activity Tolerance  Patient tolerated treatment well    Behavior During Therapy  [facility] for tasks assessed/performed       Past Medical History:  Diagnosis Date  . Allergy   . Arthritis   . Cataract   . Thyroid disease     Past Surgical History:  Procedure Laterality Date  . DG  BONE DENSITY ([place] HX)    . KNEE SURGERY    . TUBAL LIGATION      There were no vitals filed for this visit.  Subjective Assessment - 06/10/17 1021    Subjective  saw MD yesterday - no planned injections; take tylenol for any other pain    Diagnostic tests  MRI: 1. Partially visualized diffuse marrow edema in the right sacral ala consistent with fracture. 2. Multilevel lumbar disc and facet degeneration resulting in up to mild lateral recess and neural foraminal narrowing as above. No high-grade stenosis.    Patient Stated Goals  improve pain    Currently in Pain?  Yes    Pain Score  2     Pain Location  Leg    Pain Orientation  Right;Left;Upper;Posterior    Pain Descriptors / Indicators  Sore;Tightness    Pain Type  Acute pain                       [facility] Adult PT Treatment/Exercise - 06/10/17 1031      Lumbar Exercises: Stretches   Other Lumbar Stretch Exercise  red ball rollouts - fwd/R/L 5 x 5 sec each      Lumbar Exercises: Aerobic   Nustep  L4 x 6 min      Lumbar Exercises: Machines for Strengthening   Cybex  Knee Extension  B LE - 15# - 2 x 10      Lumbar Exercises: Standing   Row  Strengthening;Both;15 reps;Theraband    Theraband Level (Row)  Level 2 (Red)      Lumbar Exercises: Seated   Other Seated Lumbar Exercises  B LE - fitter - 1  black/1 blue x 15 each LE    Other Seated Lumbar Exercises  B HS curls - green tband x 15 reps      Modalities   Modalities  Electrical Stimulation;Moist Heat      Moist Heat Therapy   Number Minutes Moist Heat  15 Minutes    Moist Heat Location  Lumbar Spine      Electrical Stimulation   Electrical Stimulation Location  B low back    Electrical Stimulation Action  IFC     Electrical Stimulation Parameters  to tolerance    Electrical Stimulation Goals  Pain;Tone      Manual Therapy   Manual Therapy  Passive ROM    Passive ROM  passive  stretching of B HS + overpressure for gastroc                  PT Long Term Goals - 05/19/17 1013      PT LONG TERM GOAL #1   Title  patient to be independent with advanced HEP    Status  On-going      PT LONG TERM GOAL #2   Title  patient to demonstrate normal lumbar AROM in all planes with pain no greater than 2/10    Status  On-going      PT LONG TERM GOAL #3   Title  patient to demonstrate B LE strength of >/= 4+/5 without pain provocation    Status  On-going      PT LONG TERM GOAL #4   Title  patient to report ability to perform ADLs and leisure activities with pain no greater than 2/10    Status  On-going            Plan - 06/10/17 1125    Clinical Impression Statement  Continued improvements in gait and general mobility. Patient reports being seen by MD - no follow-up injections and increase walking tolerance. Patient doing well in session today - does contiue to report low level soreness in B HS - strertching and strengthening continued in this area with good tolerance. Making good progress towards goals.     PT Treatment/Interventions  ADLs/Self Care Home  Management;Cryotherapy;Electrical Stimulation;Iontophoresis 4mg /ml Dexamethasone;Moist Heat;Traction;Therapeutic exercise;Therapeutic activities;Functional mobility training;Ultrasound;Neuromuscular re-education;Patient/family education;Manual techniques;Vasopneumatic Device;Taping;Dry needling;Passive range of motion    Consulted and Agree with Plan of Care  Patient       Patient will benefit from skilled therapeutic intervention in order to improve the following deficits and impairments:  Abnormal gait, Decreased activity tolerance, Decreased range of motion, Decreased mobility, Difficulty walking, Pain, Decreased strength  Visit Diagnosis: Acute bilateral low back pain without sciatica  Muscle weakness (generalized)  Other symptoms and signs involving the musculoskeletal system     Problem List Patient Active Problem List   Diagnosis Date Noted  . Seasonal allergies 04/21/2012  . Cataract 04/21/2012  . Osteoporosis 05/01/2011  . Hypothyroidism 04/30/2011  . Osteoarthritis 04/30/2011     [name], PT, DPT 06/10/17 11:27 AM   [facility] [address]  [place], [place], [address] Phone: [phone]   Fax:  [phone]  Name: Nicole Brennan MRN: [id number] Date of Birth: Aug 16, 1940

## 2017-06-13 ENCOUNTER — Ambulatory Visit: Payer: Medicare Other | Admitting: Physical Therapy

## 2017-06-13 ENCOUNTER — Encounter: Payer: Self-pay | Admitting: Physical Therapy

## 2017-06-13 DIAGNOSIS — M545 Low back pain, unspecified: Secondary | ICD-10-CM

## 2017-06-13 DIAGNOSIS — M6281 Muscle weakness (generalized): Secondary | ICD-10-CM | POA: Diagnosis not present

## 2017-06-13 DIAGNOSIS — R29898 Other symptoms and signs involving the musculoskeletal system: Secondary | ICD-10-CM

## 2017-06-13 NOTE — Therapy (Signed)
[facility] [address]  [place], [place], [address] Phone: [phone]   Fax:  [phone]  Physical Therapy Treatment  Patient Details  Name: Nicole Brennan MRN: [id number] Date of Birth: [birth year] Referring Provider: Dr. Rhona Raider   Encounter Date: 06/13/2017  PT End of Session - 06/13/17 1022    Visit Number  8    Number of Visits  12    Date for PT Re-Evaluation  06/25/17    Authorization Type  Medicare    PT Start Time  1020    PT Stop Time  1119    PT Time Calculation (min)  59 min    Activity Tolerance  Patient tolerated treatment well    Behavior During Therapy  [facility] for tasks assessed/performed       Past Medical History:  Diagnosis Date  . Allergy   . Arthritis   . Cataract   . Thyroid disease     Past Surgical History:  Procedure Laterality Date  . DG  BONE DENSITY ([place] HX)    . KNEE SURGERY    . TUBAL LIGATION      There were no vitals filed for this visit.  Subjective Assessment - 06/13/17 1022    Subjective  doing well - no new complaints; trying to start walking more    Diagnostic tests  MRI: 1. Partially visualized diffuse marrow edema in the right sacral ala consistent with fracture. 2. Multilevel lumbar disc and facet degeneration resulting in up to mild lateral recess and neural foraminal narrowing as above. No high-grade stenosis.    Patient Stated Goals  improve pain    Currently in Pain?  No/denies    Pain Score  0-No pain                       [facility] Adult PT Treatment/Exercise - 06/13/17 1025      Lumbar Exercises: Stretches   Passive Hamstring Stretch  Right;Left;1 rep;30 seconds seated forward bend      Lumbar Exercises: Aerobic   Nustep  L5 x 6 min      Lumbar Exercises: Standing   Other Standing Lumbar Exercises  hip flexion, abduction, extension - red tband x 10 each way/side      Lumbar Exercises: Seated   Long Arc Quad on Orange   Strengthening;Both;15 reps;Weights seated on disc    LAQ on [employer] (lbs)  2    Hip Flexion on Ball  Strengthening;Both;15 reps seated on disc - 2#    Other Seated Lumbar Exercises  seated row - red tband x 15 reps - seated on disc    Other Seated Lumbar Exercises  B HS curls - 2# + green tband - seated on disc x 15 each LE      Modalities   Modalities  Electrical Stimulation;Moist Heat      Moist Heat Therapy   Number Minutes Moist Heat  15 Minutes    Moist Heat Location  Lumbar Spine      Electrical Stimulation   Electrical Stimulation Location  B low back    Electrical Stimulation Action  IFC     Electrical Stimulation Parameters  to tolerance    Electrical Stimulation Goals  Pain;Tone                  PT Long Term Goals - 05/19/17 1013      PT LONG TERM GOAL #1  Title  patient to be independent with advanced HEP    Status  On-going      PT LONG TERM GOAL #2   Title  patient to demonstrate normal lumbar AROM in all planes with pain no greater than 2/10    Status  On-going      PT LONG TERM GOAL #3   Title  patient to demonstrate B LE strength of >/= 4+/5 without pain provocation    Status  On-going      PT LONG TERM GOAL #4   Title  patient to report ability to perform ADLs and leisure activities with pain no greater than 2/10    Status  On-going            Plan - 06/13/17 1022    Clinical Impression Statement  [name] doing well - reports near resolution of butock/low back pain, but does report some pain at anterior hips - may be caused due to prolonged hip flexion with sitting. Patient tolerable to all strengthening today with no issue. Planned 1 week break from PT and then possible transition to HEP at next visit depending on symptoms.     PT Treatment/Interventions  ADLs/Self Care Home Management;Cryotherapy;Electrical Stimulation;Iontophoresis 4mg /ml Dexamethasone;Moist Heat;Traction;Therapeutic exercise;Therapeutic activities;Functional mobility  training;Ultrasound;Neuromuscular re-education;Patient/family education;Manual techniques;Vasopneumatic Device;Taping;Dry needling;Passive range of motion    Consulted and Agree with Plan of Care  Patient       Patient will benefit from skilled therapeutic intervention in order to improve the following deficits and impairments:  Abnormal gait, Decreased activity tolerance, Decreased range of motion, Decreased mobility, Difficulty walking, Pain, Decreased strength  Visit Diagnosis: Acute bilateral low back pain without sciatica  Muscle weakness (generalized)  Other symptoms and signs involving the musculoskeletal system     Problem List Patient Active Problem List   Diagnosis Date Noted  . Seasonal allergies 04/21/2012  . Cataract 04/21/2012  . Osteoporosis 05/01/2011  . Hypothyroidism 04/30/2011  . Osteoarthritis 04/30/2011     [name], PT, DPT 06/13/17 11:23 AM   [facility] [address]  [place], [place], [address] Phone: [phone]   Fax:  [phone]  Name: Nicole Brennan MRN: [id number] Date of Birth: 1940/08/08

## 2017-06-20 ENCOUNTER — Encounter: Payer: Self-pay | Admitting: Physical Therapy

## 2017-06-20 ENCOUNTER — Ambulatory Visit: Payer: Medicare Other | Admitting: Physical Therapy

## 2017-06-20 DIAGNOSIS — M545 Low back pain, unspecified: Secondary | ICD-10-CM

## 2017-06-20 DIAGNOSIS — R29898 Other symptoms and signs involving the musculoskeletal system: Secondary | ICD-10-CM

## 2017-06-20 DIAGNOSIS — M6281 Muscle weakness (generalized): Secondary | ICD-10-CM

## 2017-06-20 NOTE — Therapy (Addendum)
[facility] [address]  [place], [place], [address] Phone: [phone]   Fax:  [phone]  Physical Therapy Treatment  Patient Details  Name: Nicole Brennan MRN: [id number] Date of Birth: 25-Aug-1940 Referring Provider: Dr. Rhona Raider   Encounter Date: 06/20/2017  PT End of Session - 06/20/17 1019    Visit Number  9    Number of Visits  12    Date for PT Re-Evaluation  06/25/17    Authorization Type  Medicare    PT Start Time  1013    PT Stop Time  1100    PT Time Calculation (min)  47 min    Activity Tolerance  Patient tolerated treatment well    Behavior During Therapy  [facility] for tasks assessed/performed       Past Medical History:  Diagnosis Date  . Allergy   . Arthritis   . Cataract   . Thyroid disease     Past Surgical History:  Procedure Laterality Date  . DG  BONE DENSITY ([place] HX)    . KNEE SURGERY    . TUBAL LIGATION      There were no vitals filed for this visit.  Subjective Assessment - 06/20/17 1013    Subjective  doing well - feels like she can make today her last day    Diagnostic tests  MRI: 1. Partially visualized diffuse marrow edema in the right sacral ala consistent with fracture. 2. Multilevel lumbar disc and facet degeneration resulting in up to mild lateral recess and neural foraminal narrowing as above. No high-grade stenosis.    Patient Stated Goals  improve pain    Currently in Pain?  No/denies    Pain Score  0-No pain         [facility] PT Assessment - 06/20/17 0001      AROM   AROM Assessment Site  Lumbar    Lumbar Flexion  fingertips to knees - no pain    Lumbar Extension  25% limited - slight pull    Lumbar - Right Side Bend  WNL - tightness    Lumbar - Left Side Bend  WNL - tightness    Lumbar - Right Rotation  WNL    Lumbar - Left Rotation  WNL      Strength   Strength Assessment Site  Knee    Right/Left Hip  Right;Left    Right Hip Flexion  4+/5    Left Hip  Flexion  4+/5    Right/Left Knee  Right;Left    Right Knee Flexion  4+/5    Right Knee Extension  5/5    Left Knee Flexion  4+/5    Left Knee Extension  5/5                   [facility] Adult PT Treatment/Exercise - 06/20/17 0001      Lumbar Exercises: Stretches   Passive Hamstring Stretch  Right;Left;2 reps;30 seconds seated    Piriformis Stretch  -- attempted seated - unable    Other Lumbar Stretch Exercise  red ball rollouts - fwd/R/L 5 x 5 sec each      Lumbar Exercises: Standing   Other Standing Lumbar Exercises  B side stepping x 20 feet each direction - red tband    Other Standing Lumbar Exercises  step up and overs - 8" step - single UE support x 10 each LE      Lumbar Exercises: Seated  Hip Flexion on Ball  Strengthening;Both;15 reps seated on disc - 3#      Modalities   Modalities  Electrical Stimulation;Moist Heat      Moist Heat Therapy   Number Minutes Moist Heat  15 Minutes    Moist Heat Location  Lumbar Spine      Electrical Stimulation   Electrical Stimulation Location  B low back    Electrical Stimulation Action  IFC    Electrical Stimulation Parameters  to tolerance    Electrical Stimulation Goals  Pain;Tone                  PT Long Term Goals - 06/20/17 1224      PT LONG TERM GOAL #1   Title  patient to be independent with advanced HEP    Status  Achieved      PT LONG TERM GOAL #2   Title  patient to demonstrate normal lumbar AROM in all planes with pain no greater than 2/10    Status  Partially Met      PT LONG TERM GOAL #3   Title  patient to demonstrate B LE strength of >/= 4+/5 without pain provocation    Status  Achieved      PT LONG TERM GOAL #4   Title  patient to report ability to perform ADLs and leisure activities with pain no greater than 2/10    Status  Achieved            Plan - 06/20/17 1019    Clinical Impression Statement  Pat reporting near complete resolution of pain at low back - able to perform  all ADLs, household, and work duties without pain limiting. Has greatly improved lumbar AROM, strength, and gait since starting therapy. Review of HEP today with good carryover. Will plan to place patient on 30 day hold at this time.     PT Treatment/Interventions  ADLs/Self Care Home Management;Cryotherapy;Electrical Stimulation;Iontophoresis 57m/ml Dexamethasone;Moist Heat;Traction;Therapeutic exercise;Therapeutic activities;Functional mobility training;Ultrasound;Neuromuscular re-education;Patient/family education;Manual techniques;Vasopneumatic Device;Taping;Dry needling;Passive range of motion    Consulted and Agree with Plan of Care  Patient       Patient will benefit from skilled therapeutic intervention in order to improve the following deficits and impairments:  Abnormal gait, Decreased activity tolerance, Decreased range of motion, Decreased mobility, Difficulty walking, Pain, Decreased strength  Visit Diagnosis: Acute bilateral low back pain without sciatica  Muscle weakness (generalized)  Other symptoms and signs involving the musculoskeletal system     Problem List Patient Active Problem List   Diagnosis Date Noted  . Seasonal allergies 04/21/2012  . Cataract 04/21/2012  . Osteoporosis 05/01/2011  . Hypothyroidism 04/30/2011  . Osteoarthritis 04/30/2011     SLanney Gins PT, DPT 06/20/17 12:28 PM  PHYSICAL THERAPY DISCHARGE SUMMARY  Visits from Start of Care: 9  Current functional level related to goals / functional outcomes: See above   Remaining deficits: See above   Education / Equipment: HEP  Plan: Patient agrees to discharge.  Patient goals were met. Patient is being discharged due to meeting the stated rehab goals.  ?????    SLanney Gins PT, DPT 07/24/17 9:01 AM     [facility] [place] [address]Phone: [phone]  Fax:  [phone] Name:  Nicole LINESMRN: [id number] of Birth: 1Mar 25, 1942

## 2017-06-25 ENCOUNTER — Ambulatory Visit: Payer: Medicare Other | Admitting: Physical Therapy

## 2017-07-02 ENCOUNTER — Ambulatory Visit: Payer: Medicare Other | Admitting: *Deleted

## 2017-08-15 ENCOUNTER — Other Ambulatory Visit: Payer: Self-pay | Admitting: Family Medicine

## 2017-08-15 DIAGNOSIS — E038 Other specified hypothyroidism: Secondary | ICD-10-CM

## 2017-09-29 ENCOUNTER — Encounter: Payer: Self-pay | Admitting: Family Medicine

## 2017-09-29 ENCOUNTER — Other Ambulatory Visit: Payer: Medicare Other

## 2017-09-29 ENCOUNTER — Other Ambulatory Visit: Payer: Self-pay | Admitting: Emergency Medicine

## 2017-09-29 ENCOUNTER — Other Ambulatory Visit (INDEPENDENT_AMBULATORY_CARE_PROVIDER_SITE_OTHER): Payer: Medicare Other

## 2017-09-29 ENCOUNTER — Ambulatory Visit: Payer: Medicare Other | Admitting: *Deleted

## 2017-09-29 DIAGNOSIS — E038 Other specified hypothyroidism: Secondary | ICD-10-CM

## 2017-09-29 LAB — TSH: TSH: 3.59 u[IU]/mL (ref 0.35–4.50)

## 2017-10-07 NOTE — Progress Notes (Signed)
Subjective:   Nicole Brennan is a 77 y.o. female who presents for Medicare Annual (Subsequent) preventive examination.  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   Review of Systems: No ROS.  Medicare Wellness Visit. Additional risk factors are reflected in the social history. Cardiac Risk Factors include: advanced age (>61men, >36 women) Sleep patterns: No issues. Home Safety/Smoke Alarms: Feels safe in home. Smoke alarms in place.  Living environment; residence and Adult nurse: lives with husband. No stairs. Eye- Dr.Stonecipher. Next appt in Aug.  Female:      Mammo- utd      Dexa scan- ordered       CCS-declines     Objective:     Vitals: BP (!) 116/56 (BP Location: Left Arm, Patient Position: Sitting, Cuff Size: Normal)   Pulse 64   Ht 5\' 5"  (1.651 m)   Wt 185 lb 9.6 oz (84.2 kg)   SpO2 97%   BMI 30.89 kg/m   Body mass index is 30.89 kg/m.  Advanced Directives 10/13/2017 07/01/2016 03/31/2016 01/29/2016  Does Patient Have a Medical Advance Directive? Yes Yes Yes Yes  Type of Paramedic of [place];Living will [place];Living will [place];Living will [place];Living will  Does patient want to make changes to medical advance directive? - - No - Patient declined -  Copy of [place] in Chart? No - copy requested No - copy requested No - copy requested -  Would patient like information on creating a medical advance directive? - - No - Patient declined -    Tobacco Social History   Tobacco Use  Smoking Status Never Smoker  Smokeless Tobacco Never Used     Counseling given: Not Answered   Clinical Intake: Pain : No/denies pain   Past Medical History:  Diagnosis Date  . Allergy   . Arthritis   . Cataract   . Thyroid disease    Past Surgical History:  Procedure Laterality Date  . DG  BONE DENSITY ([place] HX)    . KNEE SURGERY    . TUBAL  LIGATION     Family History  Problem Relation Age of Onset  . Cancer Mother   . Vascular Disease Mother   . Dementia Father    Social History   Socioeconomic History  . Marital status: Married    Spouse name: Not on file  . Number of children: Not on file  . Years of education: Not on file  . Highest education level: Not on file  Occupational History  . Not on file  Social Needs  . Financial resource strain: Not on file  . Food insecurity:    Worry: Not on file    Inability: Not on file  . Transportation needs:    Medical: Not on file    Non-medical: Not on file  Tobacco Use  . Smoking status: Never Smoker  . Smokeless tobacco: Never Used  Substance and Sexual Activity  . Alcohol use: No  . Drug use: No  . Sexual activity: Not on file  Lifestyle  . Physical activity:    Days per week: Not on file    Minutes per session: Not on file  . Stress: Not on file  Relationships  . Social connections:    Talks on phone: Not on file    Gets together: Not on file    Attends religious service: Not on file  Active member of club or organization: Not on file    Attends meetings of clubs or organizations: Not on file    Relationship status: Not on file  Other Topics Concern  . Not on file  Social History Narrative  . Not on file    Outpatient Encounter Medications as of 10/13/2017  Medication Sig  . acetaminophen (TYLENOL) 500 MG tablet Take 1,000 mg by mouth.  . calcium carbonate 200 MG capsule Take 250 mg by mouth 2 (two) times daily with a meal.  . Carboxymethylcellulose Sodium (THERATEARS OP) Apply to eye.  . Glycerin-Polysorbate 80 (REFRESH DRY EYE THERAPY OP) Apply 1 drop to eye as needed.  Marland Kitchen levothyroxine (SYNTHROID, LEVOTHROID) 75 MCG tablet TAKE 1 TABLET EVERY DAY  . Multiple Vitamins-Minerals (MULTIVITAMIN WITH MINERALS) tablet Take 1 tablet by mouth daily.  . Turmeric 450 MG CAPS Take 1 capsule by mouth daily.  . methocarbamol (ROBAXIN) 500 MG tablet Take 1  tablet (500 mg total) by mouth every 8 (eight) hours as needed for muscle spasms. (Patient not taking: Reported on 10/13/2017)  . traMADol (ULTRAM) 50 MG tablet Take 1 tablet by mouth as needed.   No facility-administered encounter medications on file as of 10/13/2017.     Activities of Daily Living In your present state of health, do you have any difficulty performing the following activities: 10/13/2017  Hearing? N  Vision? N  Difficulty concentrating or making decisions? N  Walking or climbing stairs? N  Dressing or bathing? N  Doing errands, shopping? N  Preparing Food and eating ? N  Using the Toilet? N  In the past six months, have you accidently leaked urine? N  Do you have problems with loss of bowel control? N  Managing your Medications? N  Managing your Finances? N  Housekeeping or managing your Housekeeping? N  Some recent data might be hidden    Patient Care Team: Copland, Gay Filler, MD as PCP - General (Family Medicine) Melrose Nakayama, MD as Consulting Physician (Orthopedic Surgery)    Assessment:   This is a routine wellness examination for Nicole Brennan. Physical assessment deferred to PCP.  Exercise Activities and Dietary recommendations Current Exercise Habits: The patient does not participate in regular exercise at present, Exercise limited by: None identified Diet (meal preparation, eat out, water intake, caffeinated beverages, dairy products, fruits and vegetables): pt states she eats healthy  Goals    . Maintain healthy active lifestyle.       Fall Risk Fall Risk  10/13/2017 07/01/2016 09/04/2015 08/11/2015 03/21/2015  Falls in the past year? No No No No No   Depression Screen PHQ 2/9 Scores 10/13/2017 07/01/2016 09/04/2015 08/11/2015  PHQ - 2 Score 0 0 0 0     Cognitive Function Ad8 score reviewed for issues:  Issues making decisions:no  Less interest in hobbies / activities:no  Repeats questions, stories (family complaining):no  Trouble using ordinary gadgets  (microwave, computer, phone):no  Forgets the month or year: no  Mismanaging finances: no  Remembering appts:no  Daily problems with thinking and/or memory:no Ad8 score is=0   MMSE - Mini Mental State Exam 07/01/2016  Orientation to time 5  Orientation to Place 5  Registration 3  Attention/ Calculation 5  Recall 3  Language- name 2 objects 2  Language- repeat 1  Language- follow 3 step command 3  Language- read & follow direction 1  Write a sentence 1  Copy design 1  Total score 30        Immunization  History  Administered Date(s) Administered  . Influenza Whole 12/10/2011  . Influenza, High Dose Seasonal PF 01/22/2016, 12/26/2016  . Influenza,inj,Quad PF,6+ Mos 12/21/2013, 11/15/2014  . Pneumococcal Conjugate-13 10/25/2013  . Pneumococcal-Unspecified 03/12/2003  . Tdap 03/12/2007  . Zoster 03/11/2008    Screening Tests Health Maintenance  Topic Date Due  . PNA vac Low Risk Adult (2 of 2 - PPSV23) 10/26/2014  . TETANUS/TDAP  03/11/2017  . INFLUENZA VACCINE  10/09/2017  . COLONOSCOPY  03/11/2018  . DEXA SCAN  Completed      Plan:    Please schedule your next medicare wellness visit with me in 1 yr.  Continue to eat heart healthy diet (full of fruits, vegetables, whole grains, lean protein, water--limit salt, fat, and sugar intake) and increase physical activity as tolerated.  Continue doing brain stimulating activities (puzzles, reading, adult coloring books, staying active) to keep memory sharp.   I have ordered your bone density scan. They will call you to schedule.  I have personally reviewed and noted the following in the patient's chart:   . Medical and social history . Use of alcohol, tobacco or illicit drugs  . Current medications and supplements . Functional ability and status . Nutritional status . Physical activity . Advanced directives . List of other physicians . Hospitalizations, surgeries, and ER visits in previous 12  months . Vitals . Screenings to include cognitive, depression, and falls . Referrals and appointments  In addition, I have reviewed and discussed with patient certain preventive protocols, quality metrics, and best practice recommendations. A written personalized care plan for preventive services as well as general preventive health recommendations were provided to patient.     Shela Nevin, [place]  10/13/2017

## 2017-10-13 ENCOUNTER — Ambulatory Visit (INDEPENDENT_AMBULATORY_CARE_PROVIDER_SITE_OTHER): Payer: Medicare Other | Admitting: *Deleted

## 2017-10-13 ENCOUNTER — Encounter: Payer: Self-pay | Admitting: *Deleted

## 2017-10-13 VITALS — BP 116/56 | HR 64 | Ht 65.0 in | Wt 185.6 lb

## 2017-10-13 DIAGNOSIS — Z780 Asymptomatic menopausal state: Secondary | ICD-10-CM

## 2017-10-13 DIAGNOSIS — Z0000 Encounter for general adult medical examination without abnormal findings: Secondary | ICD-10-CM

## 2017-10-13 NOTE — Patient Instructions (Addendum)
Please schedule your next medicare wellness visit with me in 1 yr.  Continue to eat heart healthy diet (full of fruits, vegetables, whole grains, lean protein, water--limit salt, fat, and sugar intake) and increase physical activity as tolerated.  Continue doing brain stimulating activities (puzzles, reading, adult coloring books, staying active) to keep memory sharp.   I have ordered your bone density scan. They will call you to schedule.   Nicole Brennan , Thank you for taking time to come for your Medicare Wellness Visit. I appreciate your ongoing commitment to your health goals. Please review the following plan we discussed and let me know if I can assist you in the future.   These are the goals we discussed: Goals    . Maintain healthy active lifestyle.       This is a list of the screening recommended for you and due dates:  Health Maintenance  Topic Date Due  . Pneumonia vaccines (2 of 2 - PPSV23) 10/26/2014  . Tetanus Vaccine  03/11/2017  . Flu Shot  10/09/2017  . Colon Cancer Screening  03/11/2018  . DEXA scan (bone density measurement)  Completed    Health Maintenance for Postmenopausal Women Menopause is a normal process in which your reproductive ability comes to an end. This process happens gradually over a span of months to years, usually between the ages of 54 and 63. Menopause is complete when you have missed 12 consecutive menstrual periods. It is important to talk with your health care provider about some of the most common conditions that affect postmenopausal women, such as heart disease, cancer, and bone loss (osteoporosis). Adopting a healthy lifestyle and getting preventive care can help to promote your health and wellness. Those actions can also lower your chances of developing some of these common conditions. What should I know about menopause? During menopause, you may experience a number of symptoms, such as:  Moderate-to-severe hot flashes.  Night  sweats.  Decrease in sex drive.  Mood swings.  Headaches.  Tiredness.  Irritability.  Memory problems.  Insomnia.  Choosing to treat or not to treat menopausal changes is an individual decision that you make with your health care provider. What should I know about hormone replacement therapy and supplements? Hormone therapy products are effective for treating symptoms that are associated with menopause, such as hot flashes and night sweats. Hormone replacement carries certain risks, especially as you become older. If you are thinking about using estrogen or estrogen with progestin treatments, discuss the benefits and risks with your health care provider. What should I know about heart disease and stroke? Heart disease, heart attack, and stroke become more likely as you age. This may be due, in part, to the hormonal changes that your body experiences during menopause. These can affect how your body processes dietary fats, triglycerides, and cholesterol. Heart attack and stroke are both medical emergencies. There are many things that you can do to help prevent heart disease and stroke:  Have your blood pressure checked at least every 1-2 years. High blood pressure causes heart disease and increases the risk of stroke.  If you are 53-37 years old, ask your health care provider if you should take aspirin to prevent a heart attack or a stroke.  Do not use any tobacco products, including cigarettes, chewing tobacco, or electronic cigarettes. If you need help quitting, ask your health care provider.  It is important to eat a healthy diet and maintain a healthy weight. ? Be sure to include  plenty of vegetables, fruits, low-fat dairy products, and lean protein. ? Avoid eating foods that are high in solid fats, added sugars, or salt (sodium).  Get regular exercise. This is one of the most important things that you can do for your health. ? Try to exercise for at least 150 minutes each week.  The type of exercise that you do should increase your heart rate and make you sweat. This is known as moderate-intensity exercise. ? Try to do strengthening exercises at least twice each week. Do these in addition to the moderate-intensity exercise.  Know your numbers.Ask your health care provider to check your cholesterol and your blood glucose. Continue to have your blood tested as directed by your health care provider.  What should I know about cancer screening? There are several types of cancer. Take the following steps to reduce your risk and to catch any cancer development as early as possible. Breast Cancer  Practice breast self-awareness. ? This means understanding how your breasts normally appear and feel. ? It also means doing regular breast self-exams. Let your health care provider know about any changes, no matter how small.  If you are 16 or older, have a clinician do a breast exam (clinical breast exam or CBE) every year. Depending on your age, family history, and medical history, it may be recommended that you also have a yearly breast X-ray (mammogram).  If you have a family history of breast cancer, talk with your health care provider about genetic screening.  If you are at high risk for breast cancer, talk with your health care provider about having an MRI and a mammogram every year.  Breast cancer (BRCA) gene test is recommended for women who have family members with BRCA-related cancers. Results of the assessment will determine the need for genetic counseling and BRCA1 and for BRCA2 testing. BRCA-related cancers include these types: ? Breast. This occurs in males or females. ? Ovarian. ? Tubal. This may also be called fallopian tube cancer. ? Cancer of the abdominal or pelvic lining (peritoneal cancer). ? Prostate. ? Pancreatic.  Cervical, Uterine, and Ovarian Cancer Your health care provider may recommend that you be screened regularly for cancer of the pelvic  organs. These include your ovaries, uterus, and vagina. This screening involves a pelvic exam, which includes checking for microscopic changes to the surface of your cervix (Pap test).  For women ages 21-65, health care providers may recommend a pelvic exam and a Pap test every three years. For women ages 59-65, they may recommend the Pap test and pelvic exam, combined with testing for human papilloma virus (HPV), every five years. Some types of HPV increase your risk of cervical cancer. Testing for HPV may also be done on women of any age who have unclear Pap test results.  Other health care providers may not recommend any screening for nonpregnant women who are considered low risk for pelvic cancer and have no symptoms. Ask your health care provider if a screening pelvic exam is right for you.  If you have had past treatment for cervical cancer or a condition that could lead to cancer, you need Pap tests and screening for cancer for at least 20 years after your treatment. If Pap tests have been discontinued for you, your risk factors (such as having a new sexual partner) need to be reassessed to determine if you should start having screenings again. Some women have medical problems that increase the chance of getting cervical cancer. In these cases, your  health care provider may recommend that you have screening and Pap tests more often.  If you have a family history of uterine cancer or ovarian cancer, talk with your health care provider about genetic screening.  If you have vaginal bleeding after reaching menopause, tell your health care provider.  There are currently no reliable tests available to screen for ovarian cancer.  Lung Cancer Lung cancer screening is recommended for adults 32-2 years old who are at high risk for lung cancer because of a history of smoking. A yearly low-dose CT scan of the lungs is recommended if you:  Currently smoke.  Have a history of at least 30 pack-years of  smoking and you currently smoke or have quit within the past 15 years. A pack-year is smoking an average of one pack of cigarettes per day for one year.  Yearly screening should:  Continue until it has been 15 years since you quit.  Stop if you develop a health problem that would prevent you from having lung cancer treatment.  Colorectal Cancer  This type of cancer can be detected and can often be prevented.  Routine colorectal cancer screening usually begins at age 36 and continues through age 7.  If you have risk factors for colon cancer, your health care provider may recommend that you be screened at an earlier age.  If you have a family history of colorectal cancer, talk with your health care provider about genetic screening.  Your health care provider may also recommend using home test kits to check for hidden blood in your stool.  A small camera at the end of a tube can be used to examine your colon directly (sigmoidoscopy or colonoscopy). This is done to check for the earliest forms of colorectal cancer.  Direct examination of the colon should be repeated every 5-10 years until age 45. However, if early forms of precancerous polyps or small growths are found or if you have a family history or genetic risk for colorectal cancer, you may need to be screened more often.  Skin Cancer  Check your skin from head to toe regularly.  Monitor any moles. Be sure to tell your health care provider: ? About any new moles or changes in moles, especially if there is a change in a mole's shape or color. ? If you have a mole that is larger than the size of a pencil eraser.  If any of your family members has a history of skin cancer, especially at a young age, talk with your health care provider about genetic screening.  Always use sunscreen. Apply sunscreen liberally and repeatedly throughout the day.  Whenever you are outside, protect yourself by wearing long sleeves, pants, a wide-brimmed  hat, and sunglasses.  What should I know about osteoporosis? Osteoporosis is a condition in which bone destruction happens more quickly than new bone creation. After menopause, you may be at an increased risk for osteoporosis. To help prevent osteoporosis or the bone fractures that can happen because of osteoporosis, the following is recommended:  If you are 61-2 years old, get at least 1,000 mg of calcium and at least 600 mg of vitamin D per day.  If you are older than age 12 but younger than age 59, get at least 1,200 mg of calcium and at least 600 mg of vitamin D per day.  If you are older than age 72, get at least 1,200 mg of calcium and at least 800 mg of vitamin D per day.  Smoking and excessive alcohol intake increase the risk of osteoporosis. Eat foods that are rich in calcium and vitamin D, and do weight-bearing exercises several times each week as directed by your health care provider. What should I know about how menopause affects my mental health? Depression may occur at any age, but it is more common as you become older. Common symptoms of depression include:  Low or sad mood.  Changes in sleep patterns.  Changes in appetite or eating patterns.  Feeling an overall lack of motivation or enjoyment of activities that you previously enjoyed.  Frequent crying spells.  Talk with your health care provider if you think that you are experiencing depression. What should I know about immunizations? It is important that you get and maintain your immunizations. These include:  Tetanus, diphtheria, and pertussis (Tdap) booster vaccine.  Influenza every year before the flu season begins.  Pneumonia vaccine.  Shingles vaccine.  Your health care provider may also recommend other immunizations. This information is not intended to replace advice given to you by your health care provider. Make sure you discuss any questions you have with your health care provider. Document Released:  04/19/2005 Document Revised: 09/15/2015 Document Reviewed: 11/29/2014 Elsevier Interactive Patient Education  2018 Elsevier Inc.  

## 2017-10-13 NOTE — Progress Notes (Signed)
I have reviewed the MWE note by Ms. Vevelyn Royals and agree with her documentation  Denny Peon MD

## 2017-10-14 NOTE — Progress Notes (Signed)
[place] at [facility] [address] [place], [place] [address] [phone]  Date:  10/15/2017   Name:  Nicole Brennan   DOB:  07-18-1940   MRN:  [id number]  PCP:  Darreld Mclean, MD    Chief Complaint: Hypothyroidism (flab result of tsh, follow up appointment, pneumonia shot) and Osteoporosis (dexa scan 8/16)   History of Present Illness:  Nicole Brennan is a 77 y.o. very pleasant female patient who presents with the following:  Following up today History of hypothyroidism, osteoporosis s/p treatment and arthritis She is on synthroid   She is due for a pneumovax as she had her last shot prior to 77 yo Can also update tetanus for her   Her last dexa was in 2016 and showed the following:  ASSESSMENT: Patient's diagnostic category is NORMAL by WHO Criteria. FRACTURE RISK: NOT INCREASED  COMPARISON: When compared to prior evaluation 12/22/2002 there has been a statistically significant interval increase in bone mineral density of 21.2% of the lumbar spine and a statistically significant interval increase in bone mineral density of 10.0% of the left hip.  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 Seasonal allergies 04/21/2012  . Cataract 04/21/2012  . Osteoporosis 05/01/2011  . Hypothyroidism 04/30/2011  .  Osteoarthritis 04/30/2011    Past Medical History:  Diagnosis Date  . Allergy   . Arthritis   . Cataract   . Thyroid disease     Past Surgical History:  Procedure Laterality Date  . DG  BONE DENSITY ([place] HX)    . KNEE SURGERY    . TUBAL LIGATION      Social History   Tobacco Use  . Smoking status: Never Smoker  . Smokeless tobacco: Never Used  Substance Use Topics  . Alcohol use: No  . Drug use: No    Family History  Problem Relation Age of Onset  . Cancer Mother   . Vascular Disease Mother   . Dementia Father     Allergies  Allergen Reactions  . Penicillins Hives    Medication list has been reviewed and updated.  Current Outpatient Medications on File Prior to Visit  Medication Sig Dispense Refill  . acetaminophen (TYLENOL) 500 MG tablet Take 1,000 mg by mouth.    . calcium carbonate 200 MG capsule Take 250 mg by mouth 2 (two) times daily with a meal.    . Carboxymethylcellulose Sodium (THERATEARS OP) Apply to eye.    . Glycerin-Polysorbate 80 (REFRESH DRY EYE THERAPY OP) Apply 1 drop to eye as needed.    Marland Kitchen levothyroxine (SYNTHROID, LEVOTHROID) 75 MCG tablet TAKE 1 TABLET EVERY DAY 90 tablet 1  . Multiple Vitamins-Minerals (MULTIVITAMIN WITH MINERALS)  tablet Take 1 tablet by mouth daily.    . traMADol (ULTRAM) 50 MG tablet Take 1 tablet by mouth as needed.  0  . Turmeric 450 MG CAPS Take 1 capsule by mouth daily.     No current facility-administered medications on file prior to visit.     Review of Systems:  As per HPI- otherwise negative. No fever or chills No CP or SOB Overall she is doing great and seems much younger than her age   Physical Examination: Vitals:   10/15/17 0941  BP: 116/72  Pulse: 77  Resp: 16  Temp: 97.9 F (36.6 C)  SpO2: 98%   Vitals:   10/15/17 0941  Weight: 184 lb (83.5 kg)  Height: 5\' 5"  (1.651 m)   Body mass index is 30.62 kg/m. Ideal Body Weight: Weight in (lb) to have BMI = 25: 149.9  GEN: WDWN, NAD,  Non-toxic, A & O x 3, overweight, looks well  HEENT: Atraumatic, Normocephalic. Neck supple. No masses, No LAD. Ears and Nose: No external deformity. CV: RRR, No M/G/R. No JVD. No thrill. No extra heart sounds. PULM: CTA B, no wheezes, crackles, rhonchi. No retractions. No resp. distress. No accessory muscle use. ABD: S, NT, ND EXTR: No c/c/e NEURO Normal gait.  PSYCH: Normally interactive. Conversant. Not depressed or anxious appearing.  Calm demeanor.  Her back is non- tender at this time   Assessment and Plan: Postmenopausal estrogen deficiency  Immunization due - Plan: Pneumococcal polysaccharide vaccine 23-valent greater than or equal to 2yo subcutaneous/IM  Lumbar disc disease  Age-related osteoporosis without current pathological fracture  Other specified hypothyroidism  Primary osteoarthritis of both hands  Await her follow-up bone density scan and will be in touch with her results  Recent TSH in normal limits She is managing her hand arthritis well and is still able to play piano professionally Pneumovax 23 today   Signed Lamar Blinks, MD

## 2017-10-15 ENCOUNTER — Encounter: Payer: Self-pay | Admitting: Family Medicine

## 2017-10-15 ENCOUNTER — Ambulatory Visit (INDEPENDENT_AMBULATORY_CARE_PROVIDER_SITE_OTHER): Payer: Medicare Other | Admitting: Family Medicine

## 2017-10-15 ENCOUNTER — Other Ambulatory Visit (HOSPITAL_BASED_OUTPATIENT_CLINIC_OR_DEPARTMENT_OTHER): Payer: Medicare Other

## 2017-10-15 VITALS — BP 116/72 | HR 77 | Temp 97.9°F | Resp 16 | Ht 65.0 in | Wt 184.0 lb

## 2017-10-15 DIAGNOSIS — M810 Age-related osteoporosis without current pathological fracture: Secondary | ICD-10-CM

## 2017-10-15 DIAGNOSIS — Z780 Asymptomatic menopausal state: Secondary | ICD-10-CM | POA: Diagnosis not present

## 2017-10-15 DIAGNOSIS — M519 Unspecified thoracic, thoracolumbar and lumbosacral intervertebral disc disorder: Secondary | ICD-10-CM

## 2017-10-15 DIAGNOSIS — M19041 Primary osteoarthritis, right hand: Secondary | ICD-10-CM | POA: Diagnosis not present

## 2017-10-15 DIAGNOSIS — M19042 Primary osteoarthritis, left hand: Secondary | ICD-10-CM

## 2017-10-15 DIAGNOSIS — E038 Other specified hypothyroidism: Secondary | ICD-10-CM

## 2017-10-15 DIAGNOSIS — Z23 Encounter for immunization: Secondary | ICD-10-CM

## 2017-10-15 NOTE — Patient Instructions (Addendum)
I will watch for your bone density report and will be in touch when this comes in You got your last pneumonia vaccine today- your 65+ dose of pneumovax 23 You may want to get the Shingrix singles vaccine at your convenience at a major drug store   Otherwise let's check back in about 6 months Your recent thyroid level was normal

## 2017-10-24 ENCOUNTER — Other Ambulatory Visit (HOSPITAL_BASED_OUTPATIENT_CLINIC_OR_DEPARTMENT_OTHER): Payer: Medicare Other

## 2017-11-03 ENCOUNTER — Ambulatory Visit (HOSPITAL_BASED_OUTPATIENT_CLINIC_OR_DEPARTMENT_OTHER)
Admission: RE | Admit: 2017-11-03 | Discharge: 2017-11-03 | Disposition: A | Discharge status: Home / Self Care | Payer: Medicare Other | Source: Ambulatory Visit | Attending: Family Medicine | Admitting: Family Medicine

## 2017-11-03 ENCOUNTER — Encounter: Payer: Self-pay | Admitting: Family Medicine

## 2017-11-03 DIAGNOSIS — Z780 Asymptomatic menopausal state: Secondary | ICD-10-CM | POA: Insufficient documentation

## 2017-11-03 DIAGNOSIS — Z13820 Encounter for screening for osteoporosis: Secondary | ICD-10-CM | POA: Diagnosis not present

## 2017-11-05 DIAGNOSIS — H18413 Arcus senilis, bilateral: Secondary | ICD-10-CM | POA: Diagnosis not present

## 2017-11-05 DIAGNOSIS — H43812 Vitreous degeneration, left eye: Secondary | ICD-10-CM | POA: Diagnosis not present

## 2017-11-05 DIAGNOSIS — H02839 Dermatochalasis of unspecified eye, unspecified eyelid: Secondary | ICD-10-CM | POA: Diagnosis not present

## 2017-11-05 DIAGNOSIS — H16223 Keratoconjunctivitis sicca, not specified as Sjogren's, bilateral: Secondary | ICD-10-CM | POA: Diagnosis not present

## 2017-11-05 DIAGNOSIS — Z961 Presence of intraocular lens: Secondary | ICD-10-CM | POA: Diagnosis not present

## 2017-11-07 ENCOUNTER — Telehealth: Payer: Self-pay

## 2017-11-07 NOTE — Telephone Encounter (Signed)
Copied from [place] [phone]. Topic: Inquiry >> Nov 06, 2017  3:01 PM Nicole Brennan, NT wrote: Reason for CRM: patient would like her bone density results. She states she has not heard from anyone and she had it done on 11/03/17.

## 2017-11-07 NOTE — Telephone Encounter (Signed)
Patient notified of results and verbalized understanding.  

## 2017-12-30 ENCOUNTER — Ambulatory Visit (INDEPENDENT_AMBULATORY_CARE_PROVIDER_SITE_OTHER): Payer: Medicare Other

## 2017-12-30 DIAGNOSIS — Z23 Encounter for immunization: Secondary | ICD-10-CM

## 2018-01-01 ENCOUNTER — Telehealth: Payer: Self-pay

## 2018-01-01 NOTE — Telephone Encounter (Signed)
Handicap form was placed in the mail yesterday to be returned to patient, signed by pcp. Patient notified and states she will be on the lookut for it.

## 2018-01-01 NOTE — Telephone Encounter (Signed)
Copied from [place] [phone]. Topic: General - Other >> Jan 01, 2018 10:55 AM Lennox Solders wrote: Reason for CRM: pt is calling checking on the status of handicap form she gave to lady on 10-22 when she got her flu shot

## 2018-01-14 ENCOUNTER — Other Ambulatory Visit: Payer: Self-pay | Admitting: Family Medicine

## 2018-01-14 DIAGNOSIS — E038 Other specified hypothyroidism: Secondary | ICD-10-CM

## 2018-03-27 ENCOUNTER — Ambulatory Visit (INDEPENDENT_AMBULATORY_CARE_PROVIDER_SITE_OTHER): Payer: Medicare Other | Admitting: Medical

## 2018-03-27 ENCOUNTER — Encounter: Payer: Self-pay | Admitting: Medical

## 2018-03-27 VITALS — BP 132/55 | HR 78 | Temp 97.6°F | Resp 16 | Ht 65.0 in | Wt 193.2 lb

## 2018-03-27 DIAGNOSIS — H6123 Impacted cerumen, bilateral: Secondary | ICD-10-CM | POA: Diagnosis not present

## 2018-03-27 DIAGNOSIS — E038 Other specified hypothyroidism: Secondary | ICD-10-CM

## 2018-03-27 MED ORDER — LEVOTHYROXINE SODIUM 75 MCG PO TABS: 75.0000 ug | ORAL_TABLET | Freq: Every day | ORAL | 3 refills | Status: DC

## 2018-03-27 NOTE — Progress Notes (Signed)
Subjective:    Patient ID: Nicole Brennan, female    DOB: [birth year], 78 y.o.   MRN: [id number]  HPI  Pt in for some left ear sensation /clogged feeling on left side. Hx of lavages in past. Pt can't remember last time she got a lavage. No preceding nasal congestion, runny nose or any fever.   Review of Systems  Constitutional: Negative for chills, fatigue and fever.  HENT: Negative for congestion, ear discharge, ear pain, sinus pressure and sore throat.        Ear blocked sensation.  Respiratory: Negative for cough, choking, shortness of breath and wheezing.   Cardiovascular: Negative for chest pain and palpitations.  Gastrointestinal: Negative for abdominal pain.  Musculoskeletal: Negative for back pain.  Skin: Negative for rash.  Neurological: Negative for facial asymmetry, weakness, light-headedness and numbness.  Hematological: Negative for adenopathy. Does not bruise/bleed easily.  Psychiatric/Behavioral: Negative for behavioral problems and confusion.   Past Medical History:  Diagnosis Date  . Allergy   . Arthritis   . Cataract   . Thyroid disease      Social History   Socioeconomic History  . Marital status: Married    Spouse name: Not on file  . Number of children: Not on file  . Years of education: Not on file  . Highest education level: Not on file  Occupational History  . Not on file  Social Needs  . Financial resource strain: Not on file  . Food insecurity:    Worry: Not on file    Inability: Not on file  . Transportation needs:    Medical: Not on file    Non-medical: Not on file  Tobacco Use  . Smoking status: Never Smoker  . Smokeless tobacco: Never Used  Substance and Sexual Activity  . Alcohol use: No  . Drug use: No  . Sexual activity: Not on file  Lifestyle  . Physical activity:    Days per week: Not on file    Minutes per session: Not on file  . Stress: Not on file  Relationships  . Social connections:    Talks on phone: Not on file    Gets together: Not on file    Attends religious service: Not on file    Active member of club or organization: Not on file    Attends meetings of clubs or organizations: Not on file    Relationship status: Not on file  . Intimate partner violence:    Fear of current or ex partner: Not on file    Emotionally abused: Not on file    Physically abused: Not on file    Forced sexual activity: Not on file  Other Topics Concern  . Not on file  Social History Narrative  . Not on file    Past Surgical History:  Procedure Laterality Date  . DG  BONE DENSITY ([place] HX)    . KNEE SURGERY    . TUBAL LIGATION      Family History  Problem Relation Age of Onset  . Cancer Mother   . Vascular Disease Mother   . Dementia Father     Allergies  Allergen Reactions  . Penicillins Hives    Current Outpatient Medications on File Prior to Visit  Medication Sig Dispense Refill  . acetaminophen (TYLENOL) 500 MG tablet Take 1,000 mg by mouth.    . calcium carbonate 200 MG capsule Take 250 mg by mouth 2 (two) times daily with a meal.    .  Carboxymethylcellulose Sodium (THERATEARS OP) Apply to eye.    . Glycerin-Polysorbate 80 (REFRESH DRY EYE THERAPY OP) Apply 1 drop to eye as needed.    . Multiple Vitamins-Minerals (MULTIVITAMIN WITH MINERALS) tablet Take 1 tablet by mouth daily.    . traMADol (ULTRAM) 50 MG tablet Take 1 tablet by mouth as needed.  0  . Turmeric 450 MG CAPS Take 1 capsule by mouth daily.     No current facility-administered medications on file prior to visit.     BP (!) 132/55   Pulse 78   Temp 97.6 F (36.4 C) (Oral)   Resp 16   Ht 5\' 5"  (1.651 m)   Wt 193 lb 3.2 oz (87.6 kg)   SpO2 100%   BMI 32.15 kg/m       Objective:   Physical Exam  General  Mental Status - Alert. General Appearance - Well groomed. Not in acute distress.  Skin Rashes- No Rashes.  HEENT Head- Normal. Ear Auditory Canal - Left- has wax built up and blocking view of tm..Right - has wax  built up and blocking view of tm.Tympanic Membrane- Left- Normal. Right- Normal.(MA tried to lavage both sides but was not able to get any wax out.  I did use a curette and successfully removed very large piece of wax from the left ear canal.  Tympanic membrane was intact post wax removal. Eye Sclera/Conjunctiva- Left- Normal. Right- Normal. Nose & Sinuses Nasal Mucosa- Left-  Boggy and Congested. Right-  Boggy and  Congested.Bilateral maxillary and frontal sinus pressure. Mouth & Throat Lips: Upper Lip- Normal: no dryness, cracking, pallor, cyanosis, or vesicular eruption. Lower Lip-Normal: no dryness, cracking, pallor, cyanosis or vesicular eruption. Buccal Mucosa- Bilateral- No Aphthous ulcers. Oropharynx- No Discharge or Erythema. Tonsils: Characteristics- Bilateral- No Erythema or Congestion. Size/Enlargement- Bilateral- No enlargement. Discharge- bilateral-None.  Neck Neck- Supple. No Masses.   Chest and Lung Exam Auscultation: Breath Sounds:-Clear even and unlabored.  Cardiovascular Auscultation:Rythm- Regular, rate and rhythm. Murmurs & Other Heart Sounds:Ausculatation of the heart reveal- No Murmurs.  Lymphatic Head & Neck General Head & Neck Lymphatics: Bilateral: Description- No Localized lymphadenopathy.       Assessment & Plan:  You did have bilateral cerumen impaction.  We were unable to lavage either side today.  But I did remove 100% of the wax on the left side with currette.  On the right side the wax was too deep and he did feel hard.  You were a bit sensitive on the procedure.  I recommend that you get Debrox over-the-counter and use it daily until Tuesday.  Schedule follow-up appointment Tuesday at 9 AM.  I will reattempt lavage or removal with curette if needed.

## 2018-03-27 NOTE — Patient Instructions (Addendum)
You did have bilateral cerumen impaction.  We were unable to lavage either side today.  But I did remove 100% of the wax on the left side with currette.  On the right side the wax was too deep and he did feel hard.  You were a bit sensitive on the procedure.  I recommend that you get Debrox over-the-counter and use it daily until Tuesday.  Schedule follow-up appointment Tuesday at 9 AM.  I will reattempt lavage or removal with curette if needed.

## 2018-03-31 ENCOUNTER — Encounter: Payer: Self-pay | Admitting: Medical

## 2018-03-31 ENCOUNTER — Ambulatory Visit (INDEPENDENT_AMBULATORY_CARE_PROVIDER_SITE_OTHER): Payer: Medicare Other | Admitting: Medical

## 2018-03-31 VITALS — BP 151/61 | HR 61 | Temp 97.8°F | Resp 16 | Ht 65.0 in | Wt 192.8 lb

## 2018-03-31 DIAGNOSIS — H6121 Impacted cerumen, right ear: Secondary | ICD-10-CM

## 2018-03-31 NOTE — Patient Instructions (Signed)
You had clearance of ear wax today on rt side. No complications. Advised in future if ears  start to feel blocked sensation then use debrox 3-4. Then schedule for lavage to wash out lavage.  No further treatment needed for canal wax.  Follow up as regularly scheduled with Dr. Lorelei Pont or as needed

## 2018-03-31 NOTE — Progress Notes (Signed)
Subjective:    Patient ID: Nicole Brennan, female    DOB: 05/31/1940, 78 y.o.   MRN: [id number]  HPI  Pt in for follow up. She had large piece of wax removed on left side. Rt side had wax present but we were unable to remove the wax.   Pt has been using debrox otc and has been using to soften the wax. Now here for attempted washing out of wax again.   Review of Systems  Constitutional: Negative for chills, fatigue and fever.  HENT: Negative for congestion, ear discharge, facial swelling, nosebleeds and sinus pain.        Rt ear slight blocked sensation.  Respiratory: Negative for cough, chest tightness, shortness of breath and wheezing.   Cardiovascular: Negative for chest pain and palpitations.  Gastrointestinal: Negative for abdominal distention, abdominal pain and blood in stool.  Musculoskeletal: Negative for back pain.  Skin: Negative for rash.  Neurological: Negative for dizziness, light-headedness and headaches.  Hematological: Negative for adenopathy. Does not bruise/bleed easily.  Psychiatric/Behavioral: Negative for behavioral problems, confusion, hallucinations and self-injury. The patient is not nervous/anxious.     Past Medical History:  Diagnosis Date  . Allergy   . Arthritis   . Cataract   . Thyroid disease      Social History   Socioeconomic History  . Marital status: Married    Spouse name: Not on file  . Number of children: Not on file  . Years of education: Not on file  . Highest education level: Not on file  Occupational History  . Not on file  Social Needs  . Financial resource strain: Not on file  . Food insecurity:    Worry: Not on file    Inability: Not on file  . Transportation needs:    Medical: Not on file    Non-medical: Not on file  Tobacco Use  . Smoking status: Never Smoker  . Smokeless tobacco: Never Used  Substance and Sexual Activity  . Alcohol use: No  . Drug use: No  . Sexual activity: Not on file  Lifestyle  . Physical  activity:    Days per week: Not on file    Minutes per session: Not on file  . Stress: Not on file  Relationships  . Social connections:    Talks on phone: Not on file    Gets together: Not on file    Attends religious service: Not on file    Active member of club or organization: Not on file    Attends meetings of clubs or organizations: Not on file    Relationship status: Not on file  . Intimate partner violence:    Fear of current or ex partner: Not on file    Emotionally abused: Not on file    Physically abused: Not on file    Forced sexual activity: Not on file  Other Topics Concern  . Not on file  Social History Narrative  . Not on file    Past Surgical History:  Procedure Laterality Date  . DG  BONE DENSITY ([place] [facility])    . KNEE SURGERY    . TUBAL LIGATION      Family History  Problem Relation Age of Onset  . Cancer Mother   . Vascular Disease Mother   . Dementia Father     Allergies  Allergen Reactions  . Penicillins Hives    Current Outpatient Medications on File Prior to Visit  Medication Sig Dispense Refill  .  acetaminophen (TYLENOL) 500 MG tablet Take 1,000 mg by mouth.    . calcium carbonate 200 MG capsule Take 250 mg by mouth 2 (two) times daily with a meal.    . Carboxymethylcellulose Sodium (THERATEARS OP) Apply to eye.    . Glycerin-Polysorbate 80 (REFRESH DRY EYE THERAPY OP) Apply 1 drop to eye as needed.    Marland Kitchen levothyroxine (SYNTHROID, LEVOTHROID) 75 MCG tablet Take 1 tablet (75 mcg total) by mouth daily. 90 tablet 3  . Multiple Vitamins-Minerals (MULTIVITAMIN WITH MINERALS) tablet Take 1 tablet by mouth daily.    . traMADol (ULTRAM) 50 MG tablet Take 1 tablet by mouth as needed.  0  . Turmeric 450 MG CAPS Take 1 capsule by mouth daily.     No current facility-administered medications on file prior to visit.     BP (!) 151/61   Pulse 61   Temp 97.8 F (36.6 C) (Oral)   Resp 16   Ht 5\' 5"  (1.651 m)   Wt 192 lb 12.8 oz (87.5 kg)   SpO2 100%    BMI 32.08 kg/m       Objective:   Physical Exam  General  Mental Status - Alert. General Appearance - Well groomed. Not in acute distress.  Skin Rashes- No Rashes.   Ear Auditory Canal - Left- Normal. Right - still blocked and can't see tmTympanic Membrane- Left- Normal. Right- can't see tm.(post lavage wax removed.)   Neck Neck- Supple. No Masses.   .       Assessment & Plan:  You had clearance of ear wax today on rt side. No complications. Advised in future if ears  start to feel blocked sensation then use debrox 3-4. Then schedule for lavage to wash out lavage.  No further treatment needed for canal wax.  Follow up as regularly scheduled with Dr. Lorelei Pont or as needed    Mackie Pai, PA-C

## 2018-04-15 ENCOUNTER — Other Ambulatory Visit (HOSPITAL_BASED_OUTPATIENT_CLINIC_OR_DEPARTMENT_OTHER): Payer: Self-pay | Admitting: Family Medicine

## 2018-04-15 DIAGNOSIS — Z1231 Encounter for screening mammogram for malignant neoplasm of breast: Secondary | ICD-10-CM

## 2018-06-01 ENCOUNTER — Ambulatory Visit (HOSPITAL_BASED_OUTPATIENT_CLINIC_OR_DEPARTMENT_OTHER): Payer: Medicare Other

## 2018-07-12 NOTE — Progress Notes (Signed)
[place] at [facility] [address] [place], [place] [address] [phone]  Date:  07/13/2018   Name:  JAMILETT FERRANTE   DOB:  04/02/1940   MRN:  [id number]  PCP:  Darreld Mclean, MD    Chief Complaint: No chief complaint on file.   History of Present Illness:  DIMOND CROTTY is a 78 y.o. very pleasant female patient who presents with the following:  Patient location is home Provider location is office Patient identity confirmed with name and date of birth, she gives consent for virtual visit today  Virtual visit today for concern of vaginal bleeding She has noted some "ever so slight" bleeding over the last 2 weeks She first noted this maybe 4 weeks ago It is a trace of bleeding that may be visible on her underwear or pajamas She feels confident that this is vaginal as opposed to rectal bleeding Never had a hyst  She has step kids but no bio kids -she has never given birth She has occasionally sexually active with her husband, may have some mild discomfort with intercourse but no real pain.  She does not suspect vaginal trauma No nosebleeds, no urinary sx  She did feel a mild low back ache recently, but this went away  Patient with history of osteoporosis, hypothyroidism, arthritis Never smoker I last saw her in the office this past August  She is due for a mammo Last pap was in 2014- never had an abnormal  She thinks that she was dx with endometriosis in the past - in her 25s.  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 Seasonal allergies 04/21/2012  . Cataract  04/21/2012  . Osteoporosis 05/01/2011  . Hypothyroidism 04/30/2011  . Osteoarthritis 04/30/2011    Past Medical History:  Diagnosis Date  . Allergy   . Arthritis   . Cataract   . Thyroid disease     Past Surgical History:  Procedure Laterality Date  . DG  BONE DENSITY ([place] [facility]X)    . KNEE SURGERY    . TUBAL LIGATION      Social History   Tobacco Use  . Smoking status: Never Smoker  . Smokeless tobacco: Never Used  Substance Use Topics  . Alcohol use: No  . Drug use: No    Family History  Problem Relation Age of Onset  . Cancer Mother   . Vascular Disease Mother   . Dementia Father     Allergies  Allergen Reactions  . Penicillins Hives    Medication list has been reviewed and updated.  Current Outpatient Medications on File Prior to Visit  Medication Sig Dispense Refill  . acetaminophen (TYLENOL) 500 MG tablet Take 1,000 mg by mouth.    . calcium carbonate 200 MG capsule Take 250 mg by mouth 2 (two) times daily with a meal.    . Carboxymethylcellulose Sodium (THERATEARS OP) Apply to eye.    . Glycerin-Polysorbate 80 (REFRESH DRY EYE THERAPY OP) Apply 1 drop to eye as needed.    Marland Kitchen  levothyroxine (SYNTHROID, LEVOTHROID) 75 MCG tablet Take 1 tablet (75 mcg total) by mouth daily. 90 tablet 3  . Multiple Vitamins-Minerals (MULTIVITAMIN WITH MINERALS) tablet Take 1 tablet by mouth daily.    . traMADol (ULTRAM) 50 MG tablet Take 1 tablet by mouth as needed.  0  . Turmeric 450 MG CAPS Take 1 capsule by mouth daily.     No current facility-administered medications on file prior to visit.     Review of Systems:  As per HPI- otherwise negative. No fever  Physical Examination: There were no vitals filed for this visit. There were no vitals filed for this visit. There is no height or weight on file to calculate BMI. Ideal Body Weight:    Pt observed over video She looks well-no cough, tachypnea, wheezing is noted She is not checking any vital  signs  Assessment and Plan: Post-menopausal bleeding - Plan: Ambulatory referral to Obstetrics / Gynecology  Virtual visit today for concern of postmenopausal bleeding.  I discussed this with [name] in detail.  She needs evaluation by GYN ASAP.  I have advised her that postmenopausal bleeding can indicate endometrial cancer, so prompt evaluation is essential.  I have ordered an urgent referral to GYN.  [name] will let me know if she does not hear about an appointment in the next few days  Patient sent the following instructions: It was very nice to talk with you today, I certainly hope that we are all back at work normally soon!  As we discussed during our visit, any postmenopausal bleeding needs prompt evaluation.  I placed a referral to gynecology for you today.  Please let me know if you do not hear about your appointment in the next week or so.  I anticipate they will be able to see you relatively soon, but please let me know if your appointment is not in the next few weeks  Signed Lamar Blinks, MD

## 2018-07-13 ENCOUNTER — Ambulatory Visit (INDEPENDENT_AMBULATORY_CARE_PROVIDER_SITE_OTHER): Payer: Medicare Other | Admitting: Family Medicine

## 2018-07-13 ENCOUNTER — Encounter: Payer: Self-pay | Admitting: Family Medicine

## 2018-07-13 ENCOUNTER — Other Ambulatory Visit: Payer: Self-pay

## 2018-07-13 DIAGNOSIS — N950 Postmenopausal bleeding: Secondary | ICD-10-CM | POA: Diagnosis not present

## 2018-07-13 NOTE — Patient Instructions (Signed)
It was very nice to talk with you today, I certainly hope that we are all back at work normally soon!  As we discussed during our visit, any postmenopausal bleeding needs prompt evaluation.  I placed a referral to gynecology for you today.  Please let me know if you do not hear about your appointment in the next week or so.  I anticipate they will be able to see you relatively soon, but please let me know if your appointment is not in the next few weeks

## 2018-07-21 DIAGNOSIS — N950 Postmenopausal bleeding: Secondary | ICD-10-CM | POA: Diagnosis not present

## 2018-07-21 DIAGNOSIS — N952 Postmenopausal atrophic vaginitis: Secondary | ICD-10-CM | POA: Diagnosis not present

## 2018-07-23 ENCOUNTER — Other Ambulatory Visit: Payer: Self-pay

## 2018-07-23 ENCOUNTER — Ambulatory Visit (HOSPITAL_BASED_OUTPATIENT_CLINIC_OR_DEPARTMENT_OTHER)
Admission: RE | Admit: 2018-07-23 | Discharge: 2018-07-23 | Disposition: A | Discharge status: Home / Self Care | Payer: Medicare Other | Source: Ambulatory Visit | Attending: Family Medicine | Admitting: Family Medicine

## 2018-07-23 DIAGNOSIS — Z1231 Encounter for screening mammogram for malignant neoplasm of breast: Secondary | ICD-10-CM

## 2018-07-27 DIAGNOSIS — N950 Postmenopausal bleeding: Secondary | ICD-10-CM | POA: Diagnosis not present

## 2018-07-27 DIAGNOSIS — R9389 Abnormal findings on diagnostic imaging of other specified body structures: Secondary | ICD-10-CM | POA: Diagnosis not present

## 2018-07-27 DIAGNOSIS — D390 Neoplasm of uncertain behavior of uterus: Secondary | ICD-10-CM | POA: Diagnosis not present

## 2018-07-31 ENCOUNTER — Telehealth: Payer: Self-pay | Admitting: *Deleted

## 2018-07-31 ENCOUNTER — Encounter: Payer: Self-pay | Admitting: Family Medicine

## 2018-07-31 NOTE — Telephone Encounter (Signed)
Called and spoke with patient regarding her referral. Gave the patient the appt date/time. Gave the procedure for check in/parking  and the policy for mask/no visitors.

## 2018-08-05 ENCOUNTER — Other Ambulatory Visit: Payer: Self-pay

## 2018-08-05 ENCOUNTER — Inpatient Hospital Stay: Payer: Medicare Other | Attending: Gynecologic Oncology | Admitting: Gynecologic Oncology

## 2018-08-05 ENCOUNTER — Encounter: Payer: Self-pay | Admitting: Gynecologic Oncology

## 2018-08-05 VITALS — BP 120/62 | HR 72 | Temp 98.5°F | Resp 18 | Ht 65.0 in | Wt 186.5 lb

## 2018-08-05 DIAGNOSIS — C541 Malignant neoplasm of endometrium: Secondary | ICD-10-CM | POA: Insufficient documentation

## 2018-08-05 DIAGNOSIS — E669 Obesity, unspecified: Secondary | ICD-10-CM | POA: Diagnosis not present

## 2018-08-05 MED ORDER — IBUPROFEN 600 MG PO TABS: 600.0000 mg | ORAL_TABLET | Freq: Four times a day (QID) | ORAL | 0 refills | Status: DC | PRN

## 2018-08-05 MED ORDER — SENNOSIDES-DOCUSATE SODIUM 8.6-50 MG PO TABS: 2.0000 | ORAL_TABLET | Freq: Every day | ORAL | 0 refills | Status: DC

## 2018-08-05 MED ORDER — OXYCODONE HCL 5 MG PO TABS: 5.0000 mg | ORAL_TABLET | ORAL | 0 refills | Status: DC | PRN

## 2018-08-05 MED FILL — oxyCODONE HCL 5 MG TABS: 5 | 2 days supply | Qty: 10 | Fill #0

## 2018-08-05 MED FILL — SENNA PLUS 8.6-50 MG TABS: 8.6-50 | 50 days supply | Qty: 100 | Fill #0

## 2018-08-05 MED FILL — IBUPROFEN 600 MG TABLET: 600 | 7 days supply | Qty: 30 | Fill #0

## 2018-08-05 NOTE — H&P (View-Only) (Signed)
Consult Note: Gyn-Onc  Consult was requested by Dr. Murrell Redden for the evaluation of Nicole Brennan 78 y.o. female  CC:  Chief Complaint  Patient presents with  . Endometrial carcinoma [facility])    Assessment/Plan:  Nicole Brennan  is a 78 y.o.  year old with grade 1 endometrioid endometrial cancer.  A detailed discussion was held with the patient and her family with regard to to her endometrial cancer diagnosis. We discussed the standard management options for uterine cancer which includes surgery followed possibly by adjuvant therapy depending on the results of surgery. The options for surgical management include a hysterectomy and removal of the tubes and ovaries possibly with removal of pelvic and para-aortic lymph nodes.If feasible, a minimally invasive approach including a robotic hysterectomy or laparoscopic hysterectomy have benefits including shorter hospital stay, recovery time and better wound healing than with open surgery. The patient has been counseled about these surgical options and the risks of surgery in general including infection, bleeding, damage to surrounding structures (including bowel, bladder, ureters, nerves or vessels), and the postoperative risks of PE/ DVT, and lymphedema. I extensively reviewed the additional risks of robotic hysterectomy including possible need for conversion to open laparotomy.  I discussed positioning during surgery of trendelenberg and risks of minor facial swelling and care we take in preoperative positioning.  After counseling and consideration of her options, she desires to proceed with robotic assisted total hysterectomy with bilateral sapingo-oophorectomy and SLN biopsy.   She will be seen by anesthesia for preoperative clearance and discussion of postoperative pain management.  She was given the opportunity to ask questions, which were answered to her satisfaction, and she is agreement with the above mentioned plan of care.   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 The patient's history began in May 2020 when she began experiencing vaginal spotting and was seen by Dr. Murrell Redden performed a transvaginal ultrasound scan which showed a thickened endometrium which then prompted an endometrial biopsy which was performed on Jul 27, 2018.  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  The patient is an otherwise fairly healthy woman.  She is obese by BMI definition with a BMI of 31 kg/m.  She has hypothyroidism.  Her only prior abdominal surgery is a tubal ligation.  Family history is remarkable for mother with colon cancer.  Patient is nulliparous.  She has no history of exogenous hormone use.  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  Current Meds:  Outpatient Encounter Medications as of 08/05/2018  Medication Sig  . acetaminophen (TYLENOL) 500 MG tablet Take 1,000 mg by mouth.  . Calcium-Phosphorus-Vitamin D (CALCIUM GUMMIES) 250-100-500 MG-MG-UNIT CHEW Chew 1 tablet by mouth 2 (two) times a day.  . Carboxymethylcellulose Sodium (THERATEARS OP) Apply to eye.  . Glycerin-Polysorbate 80 (REFRESH DRY EYE THERAPY OP) Apply 1 drop to eye as needed.  Marland Kitchen levothyroxine (SYNTHROID, LEVOTHROID) 75 MCG tablet Take 1 tablet (75 mcg total) by mouth daily.  . Multiple Vitamins-Minerals (MULTIVITAMIN WITH MINERALS) tablet Take 1 tablet by mouth daily.  Marland Kitchen ibuprofen (ADVIL) 600 MG tablet Take 1 tablet (600 mg total) by mouth every 6 (six) hours as needed. For AFTER surgery  . oxyCODONE (OXY IR/ROXICODONE) 5 MG immediate release tablet Take 1 tablet (5 mg total) by mouth every 4 (four) hours  as needed for severe pain. For AFTER Surgery, do not take and drive  . senna-docusate (SENOKOT-S) 8.6-50 MG tablet Take 2 tablets by mouth at bedtime.  . traMADol (ULTRAM) 50 MG tablet Take 1  tablet by mouth as needed.  . [DISCONTINUED] calcium carbonate 200 MG capsule Take 250 mg by mouth 2 (two) times daily with a meal.  . [DISCONTINUED] Turmeric 450 MG CAPS Take 1 capsule by mouth daily.   No facility-administered encounter medications on file as of 08/05/2018.     Allergy:  Allergies  Allergen Reactions  . Penicillins Hives    Social Hx:   Social History   Socioeconomic History  . Marital status: Married    Spouse name: Not on file  . Number of children: Not on file  . Years of education: Not on file  . Highest education level: Not on file  Occupational History  . Not on file  Social Needs  . Financial resource strain: Not on file  . Food insecurity:    Worry: Not on file    Inability: Not on file  . Transportation needs:    Medical: Not on file    Non-medical: Not on file  Tobacco Use  . Smoking status: Never Smoker  . Smokeless tobacco: Never Used  Substance and Sexual Activity  . Alcohol use: No  . Drug use: No  . Sexual activity: Yes  Lifestyle  . Physical activity:    Days per week: Not on file    Minutes per session: Not on file  . Stress: Not on file  Relationships  . Social connections:    Talks on phone: Not on file    Gets together: Not on file    Attends religious service: Not on file    Active member of club or organization: Not on file    Attends meetings of clubs or organizations: Not on file    Relationship status: Not on file  . Intimate partner violence:    Fear of current or ex partner: Not on file    Emotionally abused: Not on file    Physically abused: Not on file    Forced sexual activity: Not on file  Other Topics Concern  . Not on file  Social History Narrative  . Not on file    Past Surgical Hx:  Past Surgical History:  Procedure Laterality Date  . DG  BONE DENSITY ([facility])    . KNEE SURGERY    . TUBAL LIGATION      Past Medical Hx:  Past Medical History:  Diagnosis Date  . Allergy   . Arthritis   .  Cataract   . Thyroid disease     Past Gynecological History:  See HPI No LMP recorded. Patient is postmenopausal.  Family Hx:  Family History  Problem Relation Age of Onset  . Vascular Disease Mother   . Colon cancer Mother   . Dementia Father     Review of Systems:  Constitutional  Feels well,    ENT Normal appearing ears and nares bilaterally Skin/Breast  No rash, sores, jaundice, itching, dryness Cardiovascular  No chest pain, shortness of breath, or edema  Pulmonary  No cough or wheeze.  Gastro Intestinal  No nausea, vomitting, or diarrhoea. No bright red blood per rectum, no abdominal pain, change in bowel movement, or constipation.  Genito Urinary  No frequency, urgency, dysuria,+postmenopausal bleeding Musculo Skeletal  No myalgia, arthralgia, joint swelling or pain  Neurologic  No weakness, numbness,  change in gait,  Psychology  No depression, anxiety, insomnia.   Vitals:  Blood pressure 120/62, pulse 72, temperature 98.5 F (36.9 C), temperature source Oral, resp. rate 18, height 5\' 5"  (1.651 m), weight 186 lb 8 oz (84.6 kg), SpO2 99 %.  Physical Exam: WD in NAD Neck  Supple NROM, without any enlargements.  Lymph Node Survey No cervical supraclavicular or inguinal adenopathy Cardiovascular  Pulse normal rate, regularity and rhythm. S1 and S2 normal.  Lungs  Clear to auscultation bilateraly, without wheezes/crackles/rhonchi. Good air movement.  Skin  No rash/lesions/breakdown  Psychiatry  Alert and oriented to person, place, and time  Abdomen  Normoactive bowel sounds, abdomen soft, non-tender and mildly obese without evidence of hernia.  Back No CVA tenderness Genito Urinary  Vulva/vagina: Normal external female genitalia.   No lesions. No discharge or bleeding.  Bladder/urethra:  No lesions or masses, well supported bladder  Vagina: normal, narrow, atrophic  Cervix: Normal appearing, no lesions.  Uterus:  Small, mobile, no parametrial  involvement or nodularity.  Adnexa: no palpable masses. Rectal  deferred Extremities  No bilateral cyanosis, clubbing or edema.   Thereasa Solo, MD  08/05/2018, 1:16 PM

## 2018-08-05 NOTE — Patient Instructions (Signed)
Preparing for your Surgery  Plan for surgery on August 20, 2018 with Dr. Everitt Amber at [facility] will be scheduled for a robotic assisted total laparoscopic hysterectomy, bilateral salpingo-oophorectomy, sentinel lymph node biopsy.   Pre-operative Testing -You will receive a phone call from presurgical testing at [facility] if you have not received a call already to arrange for a pre-operative testing appointment before your surgery.  This appointment normally occurs one to two weeks before your scheduled surgery.   -Bring your insurance card, copy of an advanced directive if applicable, medication list  -At that visit, you will be asked to sign a consent for a possible blood transfusion in case a transfusion becomes necessary during surgery.  The need for a blood transfusion is rare but having consent is a necessary part of your care.     -You should not be taking blood thinners or aspirin at least ten days prior to surgery unless instructed by your surgeon.  -Do not take supplements such as fish oil (omega 3), red yeast rice, tumeric before your surgery.  Day Before Surgery at [place] will be asked to take in a light diet the day before surgery.  Avoid carbonated beverages.  You will be advised to have nothing to eat or drink after midnight the evening before.    Eat a light diet the day before surgery.  Examples including soups, broths, toast, yogurt, mashed potatoes.  Things to avoid include carbonated beverages (fizzy beverages), raw fruits and raw vegetables, or beans.   If your bowels are filled with gas, your surgeon will have difficulty visualizing your pelvic organs which increases your surgical risks.  Your role in recovery Your role is to become active as soon as directed by your doctor, while still giving yourself time to heal.  Rest when you feel tired. You will be asked to do the following in order to speed your recovery:  - Cough and breathe deeply.  This helps toclear and expand your lungs and can prevent pneumonia. You may be given a spirometer to practice deep breathing. A staff member will show you how to use the spirometer. - Do mild physical activity. Walking or moving your legs help your circulation and body functions return to normal. A staff member will help you when you try to walk and will provide you with simple exercises. Do not try to get up or walk alone the first time. - Actively manage your pain. Managing your pain lets you move in comfort. We will ask you to rate your pain on a scale of zero to 10. It is your responsibility to tell your doctor or nurse where and how much you hurt so your pain can be treated.  Special Considerations -If you are diabetic, you may be placed on insulin after surgery to have closer control over your blood sugars to promote healing and recovery.  This does not mean that you will be discharged on insulin.  If applicable, your oral antidiabetics will be resumed when you are tolerating a solid diet.  -Your final pathology results from surgery should be available around one week after surgery and the results will be relayed to you when available.  -Dr. Lahoma Crocker is the surgeon that assists your GYN Oncologist with surgery.  If you end up staying the night, the next day after your surgery you will either see Dr. Denman George or Dr. Lahoma Crocker.  -FMLA forms can be faxed to [phone] and please allow  5-7 business days for completion.  Pain Management After Surgery -You have been prescribed your pain medication and bowel regimen medications before surgery so that you can have these available when you are discharged from the hospital. The pain medication is for use ONLY AFTER surgery and a new prescription will not be given.   -Make sure that you have Tylenol and Ibuprofen at home to use on a regular basis after surgery for pain control. We recommend alternating the medications every hour to six  hours since they work differently and are processed in the body differently for pain relief.  -Review the attached handout on narcotic use and their risks and side effects.   Bowel Regimen -You have been prescribed Sennakot-S to take nightly to prevent constipation especially if you are taking the narcotic pain medication intermittently.  It is important to prevent constipation and drink adequate amounts of liquids.  Blood Transfusion Information WHAT IS A BLOOD TRANSFUSION? A transfusion is the replacement of blood or some of its parts. Blood is made up of multiple cells which provide different functions.  Red blood cells carry oxygen and are used for blood loss replacement.  White blood cells fight against infection.  Platelets control bleeding.  Plasma helps clot blood.  Other blood products are available for specialized needs, such as hemophilia or other clotting disorders. BEFORE THE TRANSFUSION  Who gives blood for transfusions?   You may be able to donate blood to be used at a later date on yourself (autologous donation).  Relatives can be asked to donate blood. This is generally not any safer than if you have received blood from a stranger. The same precautions are taken to ensure safety when a relative's blood is donated.  Healthy volunteers who are fully evaluated to make sure their blood is safe. This is blood bank blood. Transfusion therapy is the safest it has ever been in the practice of medicine. Before blood is taken from a donor, a complete history is taken to make sure that person has no history of diseases nor engages in risky social behavior (examples are intravenous drug use or sexual activity with multiple partners). The donor's travel history is screened to minimize risk of transmitting infections, such as malaria. The donated blood is tested for signs of infectious diseases, such as HIV and hepatitis. The blood is then tested to be sure it is compatible with you in  order to minimize the chance of a transfusion reaction. If you or a relative donates blood, this is often done in anticipation of surgery and is not appropriate for emergency situations. It takes many days to process the donated blood. RISKS AND COMPLICATIONS Although transfusion therapy is very safe and saves many lives, the main dangers of transfusion include:   Getting an infectious disease.  Developing a transfusion reaction. This is an allergic reaction to something in the blood you were given. Every precaution is taken to prevent this. The decision to have a blood transfusion has been considered carefully by your caregiver before blood is given. Blood is not given unless the benefits outweigh the risks.  AFTER SURGERY CONSIDERATIONS  Return to work: 4-6 weeks if applicable  Activity: 1. Be up and out of the bed during the day.  Take a nap if needed.  You may walk up steps but be careful and use the hand rail.  Stair climbing will tire you more than you think, you may need to stop part way and rest.   2. No  lifting or straining for 6 weeks.  3. No driving for 1 week(s).  Do not drive if you are taking narcotic pain medicine.  4. Shower daily.  Use soap and water on your incision and pat dry; don't rub.  No tub baths until cleared by your surgeon.   5. No sexual activity and nothing in the vagina for 8 weeks.  6. You may experience a small amount of clear drainage from your incisions, which is normal.  If the drainage persists or increases, please call the office.  7. You may experience vaginal spotting after surgery or around the 6-8 week mark from surgery when the stitches at the top of the vagina begin to dissolve.  The spotting is normal but if you experience heavy bleeding, call our office.  8. Take Tylenol or ibuprofen first for pain and only use narcotic pain medication for severe pain not relieved by the Tylenol or Ibuprofen.  Monitor your Tylenol intake to a max of 4,000 mg a  day.  Diet: 1. Low sodium Heart Healthy Diet is recommended.  2. It is safe to use a laxative, such as Miralax or Colace, if you have difficulty moving your bowels. You can take Sennakot at bedtime every evening to keep bowel movements regular and to prevent constipation.    Wound Care: 1. Keep clean and dry.  Shower daily.  Reasons to call the Doctor:  Fever - Oral temperature greater than 100.4 degrees Fahrenheit  Foul-smelling vaginal discharge  Difficulty urinating  Nausea and vomiting  Increased pain at the site of the incision that is unrelieved with pain medicine.  Difficulty breathing with or without chest pain  New calf pain especially if only on one side  Sudden, continuing increased vaginal bleeding with or without clots.

## 2018-08-05 NOTE — Progress Notes (Signed)
Consult Note: Gyn-Onc  Consult was requested by Dr. Murrell Redden for the evaluation of Nicole Brennan 78 y.o. female  CC:  Chief Complaint  Patient presents with  . Endometrial carcinoma [facility])    Assessment/Plan:  Nicole Brennan  is a 78 y.o.  year old with grade 1 endometrioid endometrial cancer.  A detailed discussion was held with the patient and her family with regard to to her endometrial cancer diagnosis. We discussed the standard management options for uterine cancer which includes surgery followed possibly by adjuvant therapy depending on the results of surgery. The options for surgical management include a hysterectomy and removal of the tubes and ovaries possibly with removal of pelvic and para-aortic lymph nodes.If feasible, a minimally invasive approach including a robotic hysterectomy or laparoscopic hysterectomy have benefits including shorter hospital stay, recovery time and better wound healing than with open surgery. The patient has been counseled about these surgical options and the risks of surgery in general including infection, bleeding, damage to surrounding structures (including bowel, bladder, ureters, nerves or vessels), and the postoperative risks of PE/ DVT, and lymphedema. I extensively reviewed the additional risks of robotic hysterectomy including possible need for conversion to open laparotomy.  I discussed positioning during surgery of trendelenberg and risks of minor facial swelling and care we take in preoperative positioning.  After counseling and consideration of her options, she desires to proceed with robotic assisted total hysterectomy with bilateral sapingo-oophorectomy and SLN biopsy.   She will be seen by anesthesia for preoperative clearance and discussion of postoperative pain management.  She was given the opportunity to ask questions, which were answered to her satisfaction, and she is agreement with the above mentioned plan of care.   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 The patient's history began in May 2020 when she began experiencing vaginal spotting and was seen by Dr. Murrell Redden performed a transvaginal ultrasound scan which showed a thickened endometrium which then prompted an endometrial biopsy which was performed on Jul 27, 2018.  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  The patient is an otherwise fairly healthy woman.  She is obese by BMI definition with a BMI of 31 kg/m.  She has hypothyroidism.  Her only prior abdominal surgery is a tubal ligation.  Family history is remarkable for mother with colon cancer.  Patient is nulliparous.  She has no history of exogenous hormone use.  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  Current Meds:  Outpatient Encounter Medications as of 08/05/2018  Medication Sig  . acetaminophen (TYLENOL) 500 MG tablet Take 1,000 mg by mouth.  . Calcium-Phosphorus-Vitamin D (CALCIUM GUMMIES) 250-100-500 MG-MG-UNIT CHEW Chew 1 tablet by mouth 2 (two) times a day.  . Carboxymethylcellulose Sodium (THERATEARS OP) Apply to eye.  . Glycerin-Polysorbate 80 (REFRESH DRY EYE THERAPY OP) Apply 1 drop to eye as needed.  Marland Kitchen levothyroxine (SYNTHROID, LEVOTHROID) 75 MCG tablet Take 1 tablet (75 mcg total) by mouth daily.  . Multiple Vitamins-Minerals (MULTIVITAMIN WITH MINERALS) tablet Take 1 tablet by mouth daily.  Marland Kitchen ibuprofen (ADVIL) 600 MG tablet Take 1 tablet (600 mg total) by mouth every 6 (six) hours as needed. For AFTER surgery  . oxyCODONE (OXY IR/ROXICODONE) 5 MG immediate release tablet Take 1 tablet (5 mg total) by mouth every 4 (four) hours  as needed for severe pain. For AFTER Surgery, do not take and drive  . senna-docusate (SENOKOT-S) 8.6-50 MG tablet Take 2 tablets by mouth at bedtime.  . traMADol (ULTRAM) 50 MG tablet Take 1  tablet by mouth as needed.  . [DISCONTINUED] calcium carbonate 200 MG capsule Take 250 mg by mouth 2 (two) times daily with a meal.  . [DISCONTINUED] Turmeric 450 MG CAPS Take 1 capsule by mouth daily.   No facility-administered encounter medications on file as of 08/05/2018.     Allergy:  Allergies  Allergen Reactions  . Penicillins Hives    Social Hx:   Social History   Socioeconomic History  . Marital status: Married    Spouse name: Not on file  . Number of children: Not on file  . Years of education: Not on file  . Highest education level: Not on file  Occupational History  . Not on file  Social Needs  . Financial resource strain: Not on file  . Food insecurity:    Worry: Not on file    Inability: Not on file  . Transportation needs:    Medical: Not on file    Non-medical: Not on file  Tobacco Use  . Smoking status: Never Smoker  . Smokeless tobacco: Never Used  Substance and Sexual Activity  . Alcohol use: No  . Drug use: No  . Sexual activity: Yes  Lifestyle  . Physical activity:    Days per week: Not on file    Minutes per session: Not on file  . Stress: Not on file  Relationships  . Social connections:    Talks on phone: Not on file    Gets together: Not on file    Attends religious service: Not on file    Active member of club or organization: Not on file    Attends meetings of clubs or organizations: Not on file    Relationship status: Not on file  . Intimate partner violence:    Fear of current or ex partner: Not on file    Emotionally abused: Not on file    Physically abused: Not on file    Forced sexual activity: Not on file  Other Topics Concern  . Not on file  Social History Narrative  . Not on file    Past Surgical Hx:  Past Surgical History:  Procedure Laterality Date  . DG  BONE DENSITY ([place] HX)    . KNEE SURGERY    . TUBAL LIGATION      Past Medical Hx:  Past Medical History:  Diagnosis Date  . Allergy   . Arthritis   .  Cataract   . Thyroid disease     Past Gynecological History:  See HPI No LMP recorded. Patient is postmenopausal.  Family Hx:  Family History  Problem Relation Age of Onset  . Vascular Disease Mother   . Colon cancer Mother   . Dementia Father     Review of Systems:  Constitutional  Feels well,    ENT Normal appearing ears and nares bilaterally Skin/Breast  No rash, sores, jaundice, itching, dryness Cardiovascular  No chest pain, shortness of breath, or edema  Pulmonary  No cough or wheeze.  Gastro Intestinal  No nausea, vomitting, or diarrhoea. No bright red blood per rectum, no abdominal pain, change in bowel movement, or constipation.  Genito Urinary  No frequency, urgency, dysuria,+postmenopausal bleeding Musculo Skeletal  No myalgia, arthralgia, joint swelling or pain  Neurologic  No weakness, numbness,  change in gait,  Psychology  No depression, anxiety, insomnia.   Vitals:  Blood pressure 120/62, pulse 72, temperature 98.5 F (36.9 C), temperature source Oral, resp. rate 18, height 5\' 5"  (1.651 m), weight 186 lb 8 oz (84.6 kg), SpO2 99 %.  Physical Exam: WD in NAD Neck  Supple NROM, without any enlargements.  Lymph Node Survey No cervical supraclavicular or inguinal adenopathy Cardiovascular  Pulse normal rate, regularity and rhythm. S1 and S2 normal.  Lungs  Clear to auscultation bilateraly, without wheezes/crackles/rhonchi. Good air movement.  Skin  No rash/lesions/breakdown  Psychiatry  Alert and oriented to person, place, and time  Abdomen  Normoactive bowel sounds, abdomen soft, non-tender and mildly obese without evidence of hernia.  Back No CVA tenderness Genito Urinary  Vulva/vagina: Normal external female genitalia.   No lesions. No discharge or bleeding.  Bladder/urethra:  No lesions or masses, well supported bladder  Vagina: normal, narrow, atrophic  Cervix: Normal appearing, no lesions.  Uterus:  Small, mobile, no parametrial  involvement or nodularity.  Adnexa: no palpable masses. Rectal  deferred Extremities  No bilateral cyanosis, clubbing or edema.   Thereasa Solo, MD  08/05/2018, 1:16 PM

## 2018-08-11 ENCOUNTER — Encounter (HOSPITAL_COMMUNITY): Payer: Self-pay

## 2018-08-11 ENCOUNTER — Other Ambulatory Visit: Payer: Self-pay

## 2018-08-11 ENCOUNTER — Encounter (HOSPITAL_COMMUNITY)
Admission: RE | Admit: 2018-08-11 | Discharge: 2018-08-11 | Disposition: A | Discharge status: Home / Self Care | Payer: Medicare Other | Source: Ambulatory Visit | Attending: Gynecologic Oncology | Admitting: Gynecologic Oncology

## 2018-08-11 DIAGNOSIS — Z01812 Encounter for preprocedural laboratory examination: Secondary | ICD-10-CM | POA: Insufficient documentation

## 2018-08-11 DIAGNOSIS — C541 Malignant neoplasm of endometrium: Secondary | ICD-10-CM | POA: Insufficient documentation

## 2018-08-11 LAB — URINALYSIS, ROUTINE W REFLEX MICROSCOPIC
Bilirubin Urine: NEGATIVE
Glucose, UA: NEGATIVE mg/dL
Hgb urine dipstick: NEGATIVE
Ketones, ur: NEGATIVE mg/dL
Leukocytes,Ua: NEGATIVE
Nitrite: NEGATIVE
Protein, ur: NEGATIVE mg/dL
Specific Gravity, Urine: 1.011 (ref 1.005–1.030)
pH: 6 (ref 5.0–8.0)

## 2018-08-11 LAB — COMPREHENSIVE METABOLIC PANEL
ALT: 31 U/L (ref 0–44)
AST: 30 U/L (ref 15–41)
Albumin: 4.6 g/dL (ref 3.5–5.0)
Alkaline Phosphatase: 61 U/L (ref 38–126)
Anion gap: 7 (ref 5–15)
BUN: 17 mg/dL (ref 8–23)
CO2: 27 mmol/L (ref 22–32)
Calcium: 9.8 mg/dL (ref 8.9–10.3)
Chloride: 108 mmol/L (ref 98–111)
Creatinine, Ser: 0.9 mg/dL (ref 0.44–1.00)
GFR calc Af Amer: >60 mL/min (ref >60)
GFR calc non Af Amer: >60 mL/min (ref >60)
Glucose, Bld: 96 mg/dL (ref 70–99)
Potassium: 4 mmol/L (ref 3.5–5.1)
Sodium: 142 mmol/L (ref 135–145)
Total Bilirubin: 0.6 mg/dL (ref 0.3–1.2)
Total Protein: 7.7 g/dL (ref 6.5–8.1)

## 2018-08-11 LAB — CBC
HCT: 42.9 % (ref 36.0–46.0)
Hemoglobin: 13.5 g/dL (ref 12.0–15.0)
MCH: 31.5 pg (ref 26.0–34.0)
MCHC: 31.5 g/dL (ref 30.0–36.0)
MCV: 100.2 fL — ABNORMAL HIGH (ref 80.0–100.0)
Platelets: 374 10*3/uL (ref 150–400)
RBC: 4.28 MIL/uL (ref 3.87–5.11)
RDW: 13.2 % (ref 11.5–15.5)
WBC: 6.6 10*3/uL (ref 4.0–10.5)
nRBC: 0 % (ref 0.0–0.2)

## 2018-08-11 LAB — ABO/RH: ABO/RH(D): A POS

## 2018-08-11 NOTE — Patient Instructions (Signed)
Nicole Brennan    Your procedure is scheduled on: 08/20/18   Report to [facility] Main  Entrance  Report to admitting at 8:30 AM   YOU NEED TO HAVE A COVID 19 TEST ON___6/8____ @_______ , THIS TEST MUST BE DONE BEFORE SURGERY, COME TO [place].    Call this number if you have problems the morning of surgery [phone]    Remember: Do not eat food   :After Midnight.  BRUSH YOUR TEETH MORNING OF SURGERY AND RINSE YOUR MOUTH OUT, NO CHEWING GUM CANDY OR MINTS.      CLEAR LIQUID DIET   Foods Allowed                                                                     Foods Excluded  Coffee and tea, regular and decaf                             liquids that you cannot  Plain Jell-O in any flavor                                             see through such as: Fruit ices (not with fruit pulp)                                     milk, soups, orange juice  Iced Popsicles                                    All solid food Carbonated beverages, regular and diet                                    Cranberry, grape and apple juices Sports drinks like Gatorade Lightly seasoned clear broth or consume(fat free) Sugar, honey syrup  Sample Menu Breakfast                                Lunch                                     Supper Cranberry juice                    Beef broth                            Chicken broth Jell-O                                     Grape juice  Apple juice Coffee or tea                        Jell-O                                      Popsicle                                                Coffee or tea                        Coffee or tea  _____________________________________________________________________    Take these medicines the morning of surgery with A SIP OF WATER: Synthroid  DO NOT TAKE ANY DIABETIC MEDICATIONS DAY OF YOUR SURGERY                    You may not have any metal on your body including hair pins and              piercings  Do not wear jewelry, make-up, lotions, powders or perfumes, deodorant             Do not wear nail polish.  Do not shave  48 hours prior to surgery.       [place] - Preparing for Surgery Before surgery, you can play an important role.  Because skin is not sterile, your skin needs to be as free of germs as possible.  You can reduce the number of germs on your skin by washing with CHG (chlorahexidine gluconate) soap before surgery.  CHG is an antiseptic cleaner which kills germs and bonds with the skin to continue killing germs even after washing. Please DO NOT use if you have an allergy to CHG or antibacterial soaps.  If your skin becomes reddened/irritated stop using the CHG and inform your nurse when you arrive at Short Stay. Do not shave (including legs and underarms) for at least 48 hours prior to the first CHG shower.  You may shave your face/neck. Please follow these instructions carefully:  1.  Shower with CHG Soap the night before surgery and the  morning of Surgery.  2.  If you choose to wash your hair, wash your hair first as usual with your  normal  shampoo.  3.  After you shampoo, rinse your hair and body thoroughly to remove the  shampoo.                           4.  Use CHG as you would any other liquid soap.  You can apply chg directly  to the skin and wash                       Gently with a scrungie or clean washcloth.  5.  Apply the CHG Soap to your body ONLY FROM THE NECK DOWN.   Do not use on face/ open                           Wound or open sores. Avoid contact with eyes, ears mouth and genitals (private parts).  Wash face,  Genitals (private parts) with your normal soap.             6.  Wash thoroughly, paying special attention to the area where your surgery  will be performed.  7.  Thoroughly rinse your body with warm water from the neck down.  8.  DO NOT  shower/wash with your normal soap after using and rinsing off  the CHG Soap.                9.  Pat yourself dry with a clean towel.            10.  Wear clean pajamas.            11.  Place clean sheets on your bed the night of your first shower and do not  sleep with pets. Day of Surgery : Do not apply any lotions/deodorants the morning of surgery.  Please wear clean clothes to the hospital/surgery center.  FAILURE TO FOLLOW THESE INSTRUCTIONS MAY RESULT IN THE CANCELLATION OF YOUR SURGERY PATIENT SIGNATURE_________________________________  NURSE SIGNATURE__________________________________  ________________________________________________________________________   Do not bring valuables to the hospital. [facility] IS NOT             RESPONSIBLE   FOR VALUABLES.  Contacts, dentures or bridgework may not be worn into surgery.  Leave suitcase in the car. After surgery it may be brought to your room.     Patients discharged the day of surgery will not be allowed to drive home. IF YOU ARE HAVING SURGERY AND GOING HOME THE SAME DAY, YOU MUST HAVE AN ADULT TO DRIVE YOU HOME AND BE WITH YOU FOR 24 HOURS. YOU MAY GO HOME BY TAXI OR UBER OR ORTHERWISE, BUT AN ADULT MUST ACCOMPANY YOU HOME AND STAY WITH YOU FOR 24 HOURS.  Name and phone number of your driver:  Special Instructions: N/A              Please read over the following fact sheets you were given: _____________________________________________________________________     Incentive Spirometer  An incentive spirometer is a tool that can help keep your lungs clear and active. This tool measures how well you are filling your lungs with each breath. Taking long deep breaths may help reverse or decrease the chance of developing breathing (pulmonary) problems (especially infection) following:  A long period of time when you are unable to move or be active. BEFORE THE PROCEDURE   If the spirometer includes an indicator to show your best  effort, your nurse or respiratory therapist will set it to a desired goal.  If possible, sit up straight or lean slightly forward. Try not to slouch.  Hold the incentive spirometer in an upright position. INSTRUCTIONS FOR USE  1. Sit on the edge of your bed if possible, or sit up as far as you can in bed or on a chair. 2. Hold the incentive spirometer in an upright position. 3. Breathe out normally. 4. Place the mouthpiece in your mouth and seal your lips tightly around it. 5. Breathe in slowly and as deeply as possible, raising the piston or the ball toward the top of the column. 6. Hold your breath for 3-5 seconds or for as long as possible. Allow the piston or ball to fall to the bottom of the column. 7. Remove the mouthpiece from your mouth and breathe out normally. 8. Rest for a few seconds and repeat Steps 1 through 7 at least 10 times every 1-2  hours when you are awake. Take your time and take a few normal breaths between deep breaths. 9. The spirometer may include an indicator to show your best effort. Use the indicator as a goal to work toward during each repetition. 10. After each set of 10 deep breaths, practice coughing to be sure your lungs are clear. If you have an incision (the cut made at the time of surgery), support your incision when coughing by placing a pillow or rolled up towels firmly against it. Once you are able to get out of bed, walk around indoors and cough well. You may stop using the incentive spirometer when instructed by your caregiver.  RISKS AND COMPLICATIONS  Take your time so you do not get dizzy or light-headed.  If you are in pain, you may need to take or ask for pain medication before doing incentive spirometry. It is harder to take a deep breath if you are having pain. AFTER USE  Rest and breathe slowly and easily.  It can be helpful to keep track of a log of your progress. Your caregiver can provide you with a simple table to help with this. If you  are using the spirometer at home, follow these instructions: [place] IF:   You are having difficultly using the spirometer.  You have trouble using the spirometer as often as instructed.  Your pain medication is not giving enough relief while using the spirometer.  You develop fever of 100.5 F (38.1 C) or higher. SEEK IMMEDIATE MEDICAL CARE IF:   You cough up bloody sputum that had not been present before.  You develop fever of 102 F (38.9 C) or greater.  You develop worsening pain at or near the incision site. MAKE SURE YOU:   Understand these instructions.  Will watch your condition.  Will get help right away if you are not doing well or get worse. Document Released: 07/08/2006 Document Revised: 05/20/2011 Document Reviewed: 09/08/2006 [facility] Patient Information 2014 [place], [place].   ________________________________________________________________________

## 2018-08-17 ENCOUNTER — Telehealth: Payer: Self-pay

## 2018-08-17 ENCOUNTER — Other Ambulatory Visit (HOSPITAL_COMMUNITY)
Admission: RE | Admit: 2018-08-17 | Discharge: 2018-08-17 | Disposition: A | Discharge status: Home / Self Care | Payer: Medicare Other | Source: Ambulatory Visit | Attending: Gynecologic Oncology | Admitting: Gynecologic Oncology

## 2018-08-17 DIAGNOSIS — Z1159 Encounter for screening for other viral diseases: Secondary | ICD-10-CM | POA: Diagnosis not present

## 2018-08-17 NOTE — Telephone Encounter (Signed)
Pt states that she was not aware of having to quarantine after her covid 19 test today until her surgery.  She needs to go to the grocery store. Apologized that she did not realize the quarantine occurs after testing.   Suggested that she  See if someone could get her groceries for her.  Pt not pleased with information but verbalized understanding.

## 2018-08-18 ENCOUNTER — Encounter: Payer: Self-pay | Admitting: Family Medicine

## 2018-08-18 ENCOUNTER — Telehealth: Payer: Self-pay | Admitting: Oncology

## 2018-08-18 ENCOUNTER — Telehealth: Payer: Self-pay | Admitting: *Deleted

## 2018-08-18 LAB — NOVEL CORONAVIRUS, NAA (HOSP ORDER, SEND-OUT TO REF LAB; TAT 18-24 HRS): SARS-CoV-2, NAA: NOT DETECTED

## 2018-08-18 NOTE — Telephone Encounter (Signed)
Patient called and left a message asking what can her eat tonight and tomorrow before surgery. Santiago Glad nurse navigator will call the patient

## 2018-08-18 NOTE — Telephone Encounter (Signed)
Nicole Brennan called and asked if it is OK to eat chiicken noodle soup tomorrow.  Advised her that should be fine per [name], NP.

## 2018-08-19 NOTE — Progress Notes (Signed)
SPOKE W/  _ [place] 19:   COUGH--no  RUNNY NOSE--- no  SORE THROAT---no  NASAL CONGESTION----no  SNEEZING----no  SHORTNESS OF BREATH---no  DIFFICULTY BREATHING---no  TEMP >100.0 -----no  UNEXPLAINED BODY ACHES------no  CHILLS -------- no  HEADACHES ---------no  LOSS OF SMELL/ TASTE --------no    HAVE YOU OR ANY FAMILY MEMBER TRAVELLED PAST 14 DAYS OUT OF THE   COUNTY---no STATE----no COUNTRY----no  HAVE YOU OR ANY FAMILY MEMBER BEEN EXPOSED TO ANYONE WITH COVID 19? no

## 2018-08-20 ENCOUNTER — Encounter (HOSPITAL_COMMUNITY)
Admission: RE | Disposition: A | Discharge status: Home / Self Care | Payer: Self-pay | Source: Home / Self Care | Attending: Gynecologic Oncology

## 2018-08-20 ENCOUNTER — Ambulatory Visit (HOSPITAL_COMMUNITY): Payer: Medicare Other | Admitting: Certified Registered"

## 2018-08-20 ENCOUNTER — Ambulatory Visit (HOSPITAL_COMMUNITY)
Admission: RE | Admit: 2018-08-20 | Discharge: 2018-08-20 | Disposition: A | Discharge status: Home / Self Care | Payer: Medicare Other | Attending: Gynecologic Oncology | Admitting: Gynecologic Oncology

## 2018-08-20 ENCOUNTER — Encounter (HOSPITAL_COMMUNITY): Payer: Self-pay | Admitting: Certified Registered"

## 2018-08-20 ENCOUNTER — Ambulatory Visit (HOSPITAL_COMMUNITY): Payer: Medicare Other | Admitting: Physician Assistant

## 2018-08-20 DIAGNOSIS — E669 Obesity, unspecified: Secondary | ICD-10-CM | POA: Diagnosis present

## 2018-08-20 DIAGNOSIS — C542 Malignant neoplasm of myometrium: Secondary | ICD-10-CM | POA: Diagnosis not present

## 2018-08-20 DIAGNOSIS — E039 Hypothyroidism, unspecified: Secondary | ICD-10-CM | POA: Diagnosis not present

## 2018-08-20 DIAGNOSIS — C541 Malignant neoplasm of endometrium: Secondary | ICD-10-CM | POA: Insufficient documentation

## 2018-08-20 DIAGNOSIS — M1990 Unspecified osteoarthritis, unspecified site: Secondary | ICD-10-CM | POA: Diagnosis not present

## 2018-08-20 DIAGNOSIS — Z79890 Hormone replacement therapy (postmenopausal): Secondary | ICD-10-CM | POA: Insufficient documentation

## 2018-08-20 DIAGNOSIS — Z800 Family history of malignant neoplasm of digestive organs: Secondary | ICD-10-CM | POA: Diagnosis not present

## 2018-08-20 DIAGNOSIS — M810 Age-related osteoporosis without current pathological fracture: Secondary | ICD-10-CM | POA: Diagnosis not present

## 2018-08-20 HISTORY — PX: SENTINEL NODE BIOPSY: SHX6608

## 2018-08-20 HISTORY — PX: ROBOTIC ASSISTED TOTAL HYSTERECTOMY WITH BILATERAL SALPINGO OOPHERECTOMY: SHX6086

## 2018-08-20 LAB — TYPE AND SCREEN
ABO/RH(D): A POS
Antibody Screen: NEGATIVE

## 2018-08-20 SURGERY — HYSTERECTOMY, TOTAL, ROBOT-ASSISTED, LAPAROSCOPIC, WITH BILATERAL SALPINGO-OOPHORECTOMY
Anesthesia: General

## 2018-08-20 MED ORDER — ACETAMINOPHEN 500 MG PO TABS
1000.0000 mg | ORAL_TABLET | ORAL | Status: AC
  Administered 2018-08-20: 1000 mg via ORAL
  Filled 2018-08-20: qty 2

## 2018-08-20 MED ORDER — BUPIVACAINE HCL (PF) 0.25 % IJ SOLN
INTRAMUSCULAR | Status: AC
  Filled 2018-08-20: qty 30

## 2018-08-20 MED ORDER — MIDAZOLAM HCL 2 MG/2ML IJ SOLN: 0.5000 mg | Freq: Once | INTRAMUSCULAR | Status: DC | PRN

## 2018-08-20 MED ORDER — ROCURONIUM BROMIDE 10 MG/ML (PF) SYRINGE
PREFILLED_SYRINGE | INTRAVENOUS | Status: DC | PRN
  Administered 2018-08-20: 60 mg via INTRAVENOUS

## 2018-08-20 MED ORDER — CIPROFLOXACIN IN D5W 400 MG/200ML IV SOLN
400.0000 mg | INTRAVENOUS | Status: AC
  Administered 2018-08-20: 11:00:00 400 mg via INTRAVENOUS
  Filled 2018-08-20: qty 200

## 2018-08-20 MED ORDER — LIDOCAINE 2% (20 MG/ML) 5 ML SYRINGE
INTRAMUSCULAR | Status: DC | PRN
  Administered 2018-08-20: 60 mg via INTRAVENOUS

## 2018-08-20 MED ORDER — CLINDAMYCIN PHOSPHATE 900 MG/50ML IV SOLN
900.0000 mg | INTRAVENOUS | Status: AC
  Administered 2018-08-20: 900 mg via INTRAVENOUS
  Filled 2018-08-20: qty 50

## 2018-08-20 MED ORDER — LACTATED RINGERS IV SOLN
INTRAVENOUS | Status: DC
  Administered 2018-08-20 (×2): via INTRAVENOUS

## 2018-08-20 MED ORDER — HYDROMORPHONE HCL 1 MG/ML IJ SOLN
INTRAMUSCULAR | Status: AC
  Filled 2018-08-20: qty 1

## 2018-08-20 MED ORDER — DEXAMETHASONE SODIUM PHOSPHATE 10 MG/ML IJ SOLN
INTRAMUSCULAR | Status: AC
  Filled 2018-08-20: qty 2

## 2018-08-20 MED ORDER — ONDANSETRON HCL 4 MG/2ML IJ SOLN
INTRAMUSCULAR | Status: DC | PRN
  Administered 2018-08-20: 4 mg via INTRAVENOUS

## 2018-08-20 MED ORDER — PROPOFOL 10 MG/ML IV BOLUS
INTRAVENOUS | Status: AC
  Filled 2018-08-20: qty 20

## 2018-08-20 MED ORDER — STERILE WATER FOR IRRIGATION IR SOLN
Status: DC | PRN
  Administered 2018-08-20: 1000 mL

## 2018-08-20 MED ORDER — PROMETHAZINE HCL 25 MG/ML IJ SOLN: 6.2500 mg | INTRAMUSCULAR | Status: DC | PRN

## 2018-08-20 MED ORDER — PHENYLEPHRINE 40 MCG/ML (10ML) SYRINGE FOR IV PUSH (FOR BLOOD PRESSURE SUPPORT)
PREFILLED_SYRINGE | INTRAVENOUS | Status: DC | PRN
  Administered 2018-08-20 (×2): 80 ug via INTRAVENOUS
  Administered 2018-08-20: 40 ug via INTRAVENOUS
  Administered 2018-08-20 (×2): 80 ug via INTRAVENOUS

## 2018-08-20 MED ORDER — FENTANYL CITRATE (PF) 250 MCG/5ML IJ SOLN
INTRAMUSCULAR | Status: AC
  Filled 2018-08-20: qty 5

## 2018-08-20 MED ORDER — MEPERIDINE HCL 50 MG/ML IJ SOLN: 6.2500 mg | INTRAMUSCULAR | Status: DC | PRN

## 2018-08-20 MED ORDER — SODIUM CHLORIDE 0.9% FLUSH: 3.0000 mL | Freq: Two times a day (BID) | INTRAVENOUS | Status: DC

## 2018-08-20 MED ORDER — LACTATED RINGERS IR SOLN
Status: DC | PRN
  Administered 2018-08-20: 1

## 2018-08-20 MED ORDER — SCOPOLAMINE 1 MG/3DAYS TD PT72
1.0000 | MEDICATED_PATCH | TRANSDERMAL | Status: DC
  Administered 2018-08-20: 1.5 mg via TRANSDERMAL
  Filled 2018-08-20: qty 1

## 2018-08-20 MED ORDER — ENOXAPARIN SODIUM 40 MG/0.4ML ~~LOC~~ SOLN
40.0000 mg | SUBCUTANEOUS | Status: AC
  Administered 2018-08-20: 40 mg via SUBCUTANEOUS
  Filled 2018-08-20: qty 0.4

## 2018-08-20 MED ORDER — BUPIVACAINE HCL 0.25 % IJ SOLN
INTRAMUSCULAR | Status: DC | PRN
  Administered 2018-08-20: 16 mL

## 2018-08-20 MED ORDER — SUGAMMADEX SODIUM 200 MG/2ML IV SOLN
INTRAVENOUS | Status: DC | PRN
  Administered 2018-08-20: 200 mg via INTRAVENOUS

## 2018-08-20 MED ORDER — HYDROMORPHONE HCL 1 MG/ML IJ SOLN
0.2500 mg | INTRAMUSCULAR | Status: DC | PRN
  Administered 2018-08-20: 0.5 mg via INTRAVENOUS

## 2018-08-20 MED ORDER — PROPOFOL 10 MG/ML IV BOLUS
INTRAVENOUS | Status: DC | PRN
  Administered 2018-08-20: 100 mg via INTRAVENOUS
  Administered 2018-08-20: 30 mg via INTRAVENOUS

## 2018-08-20 MED ORDER — FENTANYL CITRATE (PF) 250 MCG/5ML IJ SOLN
INTRAMUSCULAR | Status: DC | PRN
  Administered 2018-08-20: 100 ug via INTRAVENOUS

## 2018-08-20 MED ORDER — STERILE WATER FOR INJECTION IJ SOLN
INTRAMUSCULAR | Status: AC
  Filled 2018-08-20: qty 10

## 2018-08-20 MED ORDER — DEXAMETHASONE SODIUM PHOSPHATE 4 MG/ML IJ SOLN: 4.0000 mg | INTRAMUSCULAR | Status: DC

## 2018-08-20 MED ORDER — ONDANSETRON HCL 4 MG/2ML IJ SOLN
INTRAMUSCULAR | Status: AC
  Filled 2018-08-20: qty 2

## 2018-08-20 MED ORDER — DEXAMETHASONE SODIUM PHOSPHATE 10 MG/ML IJ SOLN
INTRAMUSCULAR | Status: DC | PRN
  Administered 2018-08-20: 5 mg via INTRAVENOUS

## 2018-08-20 SURGICAL SUPPLY — 53 items
APPLICATOR SURGIFLO ENDO (HEMOSTASIS) IMPLANT
BAG LAPAROSCOPIC 12 15 PORT 16 (BASKET) IMPLANT
BAG RETRIEVAL 12/15 (BASKET)
COVER BACK TABLE 60X90IN (DRAPES) ×3 IMPLANT
COVER TIP SHEARS 8 DVNC (MISCELLANEOUS) ×2 IMPLANT
COVER TIP SHEARS 8MM DA VINCI (MISCELLANEOUS) ×1
COVER WAND RF STERILE (DRAPES) IMPLANT
DECANTER SPIKE VIAL GLASS SM (MISCELLANEOUS) IMPLANT
DERMABOND ADVANCED (GAUZE/BANDAGES/DRESSINGS) ×1
DERMABOND ADVANCED .7 DNX12 (GAUZE/BANDAGES/DRESSINGS) ×2 IMPLANT
DRAIN CHANNEL RND F F (WOUND CARE) IMPLANT
DRAPE ARM DVNC X/XI (DISPOSABLE) ×8 IMPLANT
DRAPE COLUMN DVNC XI (DISPOSABLE) ×2 IMPLANT
DRAPE DA VINCI XI ARM (DISPOSABLE) ×4
DRAPE DA VINCI XI COLUMN (DISPOSABLE) ×1
DRAPE SHEET LG 3/4 BI-LAMINATE (DRAPES) ×3 IMPLANT
DRAPE SURG IRRIG POUCH 19X23 (DRAPES) ×3 IMPLANT
ELECT REM PT RETURN 15FT ADLT (MISCELLANEOUS) ×3 IMPLANT
GAUZE 4X4 16PLY RFD (DISPOSABLE) IMPLANT
GLOVE BIO SURGEON STRL SZ 6 (GLOVE) ×12 IMPLANT
GLOVE BIO SURGEON STRL SZ 6.5 (GLOVE) IMPLANT
GOWN STRL REUS W/ TWL LRG LVL3 (GOWN DISPOSABLE) ×4 IMPLANT
GOWN STRL REUS W/TWL LRG LVL3 (GOWN DISPOSABLE) ×2
HOLDER FOLEY CATH W/STRAP (MISCELLANEOUS) ×3 IMPLANT
IRRIG SUCT STRYKERFLOW 2 WTIP (MISCELLANEOUS) ×3
IRRIGATION SUCT STRKRFLW 2 WTP (MISCELLANEOUS) ×2 IMPLANT
KIT PROCEDURE DA VINCI SI (MISCELLANEOUS)
KIT PROCEDURE DVNC SI (MISCELLANEOUS) IMPLANT
KIT TURNOVER KIT A (KITS) IMPLANT
MANIPULATOR UTERINE 4.5 ZUMI (MISCELLANEOUS) ×3 IMPLANT
NEEDLE HYPO 22GX1.5 SAFETY (NEEDLE) IMPLANT
NEEDLE SPNL 18GX3.5 QUINCKE PK (NEEDLE) IMPLANT
OBTURATOR OPTICAL STANDARD 8MM (TROCAR) ×1
OBTURATOR OPTICAL STND 8 DVNC (TROCAR) ×2
OBTURATOR OPTICALSTD 8 DVNC (TROCAR) ×2 IMPLANT
PACK ROBOT GYN CUSTOM WL (TRAY / TRAY PROCEDURE) ×3 IMPLANT
PAD POSITIONING PINK XL (MISCELLANEOUS) ×3 IMPLANT
PORT ACCESS TROCAR AIRSEAL 12 (TROCAR) ×2 IMPLANT
PORT ACCESS TROCAR AIRSEAL 5M (TROCAR) ×1
POUCH SPECIMEN RETRIEVAL 10MM (ENDOMECHANICALS) IMPLANT
SEAL CANN UNIV 5-8 DVNC XI (MISCELLANEOUS) ×6 IMPLANT
SEAL XI 5MM-8MM UNIVERSAL (MISCELLANEOUS) ×3
SET TRI-LUMEN FLTR TB AIRSEAL (TUBING) ×3 IMPLANT
SURGIFLO W/THROMBIN 8M KIT (HEMOSTASIS) IMPLANT
SUT VIC AB 0 CT1 27 (SUTURE)
SUT VIC AB 0 CT1 27XBRD ANTBC (SUTURE) IMPLANT
SUT VIC AB 4-0 PS2 18 (SUTURE) ×6 IMPLANT
SYR 10ML LL (SYRINGE) IMPLANT
TOWEL OR NON WOVEN STRL DISP B (DISPOSABLE) ×3 IMPLANT
TRAP SPECIMEN MUCOUS 40CC (MISCELLANEOUS) IMPLANT
TRAY FOLEY MTR SLVR 16FR STAT (SET/KITS/TRAYS/PACK) ×3 IMPLANT
UNDERPAD 30X30 (UNDERPADS AND DIAPERS) ×3 IMPLANT
WATER STERILE IRR 1000ML POUR (IV SOLUTION) ×3 IMPLANT

## 2018-08-20 NOTE — Transfer of Care (Signed)
Immediate Anesthesia Transfer of Care Note  Patient: Nicole Brennan  Procedure(s) Performed: XI ROBOTIC ASSISTED TOTAL HYSTERECTOMY WITH BILATERAL SALPINGO OOPHORECTOMY (Bilateral ) SENTINEL LYMPH  NODE BIOPSY (N/A )  Patient Location: PACU  Anesthesia Type:General  Level of Consciousness: awake, alert , oriented and patient cooperative  Airway & Oxygen Therapy: Patient Spontanous Breathing and Patient connected to face mask oxygen  Post-op Assessment: Report given to RN, Post -op Vital signs reviewed and stable and Patient moving all extremities  Post vital signs: Reviewed and stable  Last Vitals:  Vitals Value Taken Time  BP 132/79 08/20/18 1245  Temp    Pulse 66 08/20/18 1246  Resp 19 08/20/18 1246  SpO2 100 % 08/20/18 1246  Vitals shown include unvalidated device data.  Last Pain:  Vitals:   08/20/18 0851  TempSrc:   PainSc: 0-No pain         Complications: No apparent anesthesia complications

## 2018-08-20 NOTE — Anesthesia Procedure Notes (Signed)
Procedure Name: Intubation Date/Time: 08/20/2018 10:48 AM Performed by: Myna Bright, CRNA Pre-anesthesia Checklist: Patient identified, Emergency Drugs available, Suction available and Patient being monitored Patient Re-evaluated:Patient Re-evaluated prior to induction Oxygen Delivery Method: Circle system utilized Preoxygenation: Pre-oxygenation with 100% oxygen Induction Type: IV induction Ventilation: Mask ventilation without difficulty Laryngoscope Size: Mac and 4 Grade View: Grade II Tube type: Oral Tube size: 7.0 mm Number of attempts: 1 Airway Equipment and Method: Stylet Placement Confirmation: ETT inserted through vocal cords under direct vision,  positive ETCO2 and breath sounds checked- equal and bilateral Secured at: 21 cm Tube secured with: Tape Dental Injury: Teeth and Oropharynx as per pre-operative assessment

## 2018-08-20 NOTE — Interval H&P Note (Signed)
History and Physical Interval Note:  08/20/2018 10:13 AM  Nicole Brennan  has presented today for surgery, with the diagnosis of ENDOMETRIAL CANCER.  The various methods of treatment have been discussed with the patient and family. After consideration of risks, benefits and other options for treatment, the patient has consented to  Procedure(s): XI ROBOTIC ASSISTED TOTAL HYSTERECTOMY WITH BILATERAL SALPINGO OOPHORECTOMY (Bilateral) SENTINEL LYMPH  NODE BIOPSY (N/A) as a surgical intervention.  The patient's history has been reviewed, patient examined, no change in status, stable for surgery.  I have reviewed the patient's chart and labs.  Questions were answered to the patient's satisfaction.     Thereasa Solo

## 2018-08-20 NOTE — Anesthesia Preprocedure Evaluation (Addendum)
Anesthesia Evaluation  Patient identified by MRN, date of birth, ID band Patient awake    Reviewed: Allergy & Precautions, NPO status , Patient's Chart, lab work & pertinent test results, reviewed documented beta blocker date and time   History of Anesthesia Complications Negative for: history of anesthetic complications  Airway Mallampati: II  TM Distance: >3 FB Neck ROM: Full    Dental  (+) Dental Advisory Given   Pulmonary neg pulmonary ROS,    breath sounds clear to auscultation       Cardiovascular (-) hypertensionnegative cardio ROS   Rhythm:Regular Rate:Normal     Neuro/Psych negative neurological ROS     GI/Hepatic negative GI ROS, Neg liver ROS,   Endo/Other  Hypothyroidism   Renal/GU negative Renal ROS     Musculoskeletal  (+) Arthritis ,   Abdominal (+) + obese,   Peds  Hematology negative hematology ROS (+)   Anesthesia Other Findings   Reproductive/Obstetrics Endometrial carcinoma                            Anesthesia Physical Anesthesia Plan  ASA: II  Anesthesia Plan: General   Post-op Pain Management:    Induction: Intravenous  PONV Risk Score and Plan: 4 or greater and Dexamethasone, Ondansetron and Scopolamine patch - Pre-op  Airway Management Planned: Oral ETT  Additional Equipment:   Intra-op Plan:   Post-operative Plan: Extubation in OR  Informed Consent: I have reviewed the patients History and Physical, chart, labs and discussed the procedure including the risks, benefits and alternatives for the proposed anesthesia with the patient or authorized representative who has indicated his/her understanding and acceptance.     Dental advisory given  Plan Discussed with: CRNA and Surgeon  Anesthesia Plan Comments: (Plan routine monitors, GETA)        Anesthesia Quick Evaluation

## 2018-08-20 NOTE — Anesthesia Postprocedure Evaluation (Signed)
Anesthesia Post Note  Patient: Nicole Brennan  Procedure(s) Performed: XI ROBOTIC ASSISTED TOTAL HYSTERECTOMY WITH BILATERAL SALPINGO OOPHORECTOMY (Bilateral ) SENTINEL LYMPH  NODE BIOPSY (N/A )     Patient location during evaluation: PACU Anesthesia Type: General Level of consciousness: awake and alert, patient cooperative and oriented Pain management: pain level controlled Vital Signs Assessment: post-procedure vital signs reviewed and stable Respiratory status: spontaneous breathing, nonlabored ventilation and respiratory function stable Cardiovascular status: blood pressure returned to baseline and stable Postop Assessment: no apparent nausea or vomiting Anesthetic complications: no    Last Vitals:  Vitals:   08/20/18 1430 08/20/18 1500  BP: 121/66 (!) 144/69  Pulse: 69 67  Resp: 16 18  Temp: 36.6 C 36.7 C  SpO2: 96% 99%    Last Pain:  Vitals:   08/20/18 1500  TempSrc: Axillary  PainSc: 2                  [employer],E. Marily Konczal

## 2018-08-20 NOTE — Discharge Instructions (Addendum)
Activity: 1. Be up and out of the bed during the day.  Take a nap if needed.  You may walk up steps but be careful and use the hand rail.  Stair climbing will tire you more than you think, you may need to stop part way and rest.   2. No lifting or straining for 4 weeks.  3. No driving for 1 weeks.  Do Not drive if you are taking narcotic pain medicine.  4. Shower daily.  Use soap and water on your incision and pat dry; don't rub.   5. No sexual activity and nothing in the vagina for 8 weeks.  Medications:  - Take ibuprofen and tylenol first line for pain control. Take these regularly (every 6 hours) to decrease the build up of pain.  - If necessary, for severe pain not relieved by ibuprofen, contact Dr Serita Grit office and you will be prescribed percocet.  - While taking percocet you should take sennakot every night to reduce the likelihood of constipation. If this causes diarrhea, stop its use.  Diet: 1. Low sodium Heart Healthy Diet is recommended.  2. It is safe to use a laxative if you have difficulty moving your bowels.   Wound Care: 1. Keep clean and dry.  Shower daily.  Reasons to call the Doctor:   Fever - Oral temperature greater than 100.4 degrees Fahrenheit  Foul-smelling vaginal discharge  Difficulty urinating  Nausea and vomiting  Increased pain at the site of the incision that is unrelieved with pain medicine.  Difficulty breathing with or without chest pain  New calf pain especially if only on one side  Sudden, continuing increased vaginal bleeding with or without clots.   Follow-up: 1. See Everitt Amber in 4 weeks.  Contacts: For questions or concerns you should contact:  Dr. Everitt Amber at [phone] After hours and on week-ends call [phone] and ask to speak to the physician on call for Gynecologic Oncology   General Anesthesia, Adult, Care After This sheet gives you information about how to care for yourself after your procedure. Your  health care provider may also give you more specific instructions. If you have problems or questions, contact your health care provider. What can I expect after the procedure? After the procedure, the following side effects are common:  Pain or discomfort at the IV site.  Nausea.  Vomiting.  Sore throat.  Trouble concentrating.  Feeling cold or chills.  Weak or tired.  Sleepiness and fatigue.  Soreness and body aches. These side effects can affect parts of the body that were not involved in surgery. Follow these instructions at home:  For at least 24 hours after the procedure:  Have a responsible adult stay with you. It is important to have someone help care for you until you are awake and alert.  Rest as needed.  Do not: ? Participate in activities in which you could fall or become injured. ? Drive. ? Use heavy machinery. ? Drink alcohol. ? Take sleeping pills or medicines that cause drowsiness. ? Make important decisions or sign legal documents. ? Take care of children on your own. Eating and drinking  Follow any instructions from your health care provider about eating or drinking restrictions.  When you feel hungry, start by eating small amounts of foods that are soft and easy to digest (bland), such as toast. Gradually return to your regular diet.  Drink enough fluid to keep your urine pale yellow.  If you vomit, rehydrate  by drinking water, juice, or clear broth. General instructions  If you have sleep apnea, surgery and certain medicines can increase your risk for breathing problems. Follow instructions from your health care provider about wearing your sleep device: ? Anytime you are sleeping, including during daytime naps. ? While taking prescription pain medicines, sleeping medicines, or medicines that make you drowsy.  Return to your normal activities as told by your health care provider. Ask your health care provider what activities are safe for  you.  Take over-the-counter and prescription medicines only as told by your health care provider.  If you smoke, do not smoke without supervision.  Keep all follow-up visits as told by your health care provider. This is important. Contact a health care provider if:  You have nausea or vomiting that does not get better with medicine.  You cannot eat or drink without vomiting.  You have pain that does not get better with medicine.  You are unable to pass urine.  You develop a skin rash.  You have a fever.  You have redness around your IV site that gets worse. Get help right away if:  You have difficulty breathing.  You have chest pain.  You have blood in your urine or stool, or you vomit blood. Summary  After the procedure, it is common to have a sore throat or nausea. It is also common to feel tired.  Have a responsible adult stay with you for the first 24 hours after general anesthesia. It is important to have someone help care for you until you are awake and alert.  When you feel hungry, start by eating small amounts of foods that are soft and easy to digest (bland), such as toast. Gradually return to your regular diet.  Drink enough fluid to keep your urine pale yellow.  Return to your normal activities as told by your health care provider. Ask your health care provider what activities are safe for you. This information is not intended to replace advice given to you by your health care provider. Make sure you discuss any questions you have with your health care provider. Document Released: 06/03/2000 Document Revised: 10/11/2016 Document Reviewed: 10/11/2016 Elsevier Interactive Patient Education  2019 [employer].

## 2018-08-20 NOTE — Op Note (Signed)
OPERATIVE NOTE 08/20/18  Surgeon: Donaciano Eva   Assistants: Dr Lahoma Crocker (an MD assistant was necessary for tissue manipulation, management of robotic instrumentation, retraction and positioning due to the complexity of the case and hospital policies).   Anesthesia: General endotracheal anesthesia  ASA Class: 3   Pre-operative Diagnosis: endometrial cancer grade 1  Post-operative Diagnosis: same,   Operation: Robotic-assisted laparoscopic total hysterectomy with bilateral salpingoophorectomy, SLN biopsy   Surgeon: Donaciano Eva  Assistant Surgeon: Lahoma Crocker MD  Anesthesia: GET  Urine Output: 50cc  Operative Findings:  6cm grossly normal appearing uterus, normal appearing ovaries, surgically interrupted tubes with clips. No gross extrauterine disease. Singular mapping to left and right obturator nodes  Estimated Blood Loss:  13mL      Total IV Fluids: 800 ml         Specimens: uterus, cervix, bilateral tubes and ovaries, left and right obturator SLN         Complications:  None; patient tolerated the procedure well.         Disposition: PACU - hemodynamically stable.  Procedure Details  The patient was seen in the Holding Room. The risks, benefits, complications, treatment options, and expected outcomes were discussed with the patient.  The patient concurred with the proposed plan, giving informed consent.  The site of surgery properly noted/marked. The patient was identified as Nicole Brennan and the procedure verified as a Robotic-assisted hysterectomy with bilateral salpingo oophorectomy with SLN biopsy. A Time Out was held and the above information confirmed.  After induction of anesthesia, the patient was draped and prepped in the usual sterile manner. Pt was placed in supine position after anesthesia and draped and prepped in the usual sterile manner. The abdominal drape was placed after the CholoraPrep had been allowed to dry for 3 minutes.   Her arms were tucked to her side with all appropriate precautions.  The shoulders were stabilized with padded shoulder blocks applied to the acromium processes.  The patient was placed in the semi-lithotomy position in [place].  The perineum was prepped with Betadine. The patient was then prepped. Foley catheter was placed.  A sterile speculum was placed in the vagina.  The cervix was grasped with a single-tooth tenaculum. 2mg  total of ICG was injected into the cervical stroma at 2 and 9 o'clock with 1cc injected at a 1cm and 44mm depth (concentration 0.5mg /ml) in all locations. The cervix was dilated with Kennon Rounds dilators.  The ZUMI uterine manipulator with a medium colpotomizer ring was placed without difficulty.  A pneum occluder balloon was placed over the manipulator.  OG tube placement was confirmed and to suction.   Next, a 5 mm skin incision was made 1 cm below the subcostal margin in the midclavicular line.  The 5 mm Optiview port and scope was used for direct entry.  Opening pressure was under 10 mm CO2.  The abdomen was insufflated and the findings were noted as above.   At this point and all points during the procedure, the patient's intra-abdominal pressure did not exceed 15 mmHg. Next, a 10 mm skin incision was made in the umbilicus and a right and left port was placed about 10 cm lateral to the robot port on the right and left side.  All ports were placed under direct visualization.  The patient was placed in steep Trendelenburg.  Bowel was folded away into the upper abdomen.  The robot was docked in the normal manner.  The right and left peritoneum  were opened parallel to the IP ligament to open the retroperitoneal spaces bilaterally. The SLN mapping was performed in bilateral pelvic basins. The para rectal and paravesical spaces were opened up entirely with careful dissection below the level of the ureters bilaterally and to the depth of the uterine artery origin in order to skeletonize the  uterine "web" and ensure visualization of all parametrial channels. The para-aortic basins were carefully exposed and evaluated for isolated para-aortic SLN's. Lymphatic channels were identified travelling to the following visualized sentinel lymph node's: right and left obturator SLN. These SLN's were separated from their surrounding lymphatic tissue, removed and sent for permanent pathology.  The hysterectomy was started after the round ligament on the right side was incised and the retroperitoneum was entered and the pararectal space was developed.  The ureter was noted to be on the medial leaf of the broad ligament.  The peritoneum above the ureter was incised and stretched and the infundibulopelvic ligament was skeletonized, cauterized and cut.  The posterior peritoneum was taken down to the level of the KOH ring.  The anterior peritoneum was also taken down.  The bladder flap was created to the level of the KOH ring.  The uterine artery on the right side was skeletonized, cauterized and cut in the normal manner.  A similar procedure was performed on the left.  The colpotomy was made and the uterus, cervix, bilateral ovaries and tubes were amputated and delivered through the vagina.  Pedicles were inspected and excellent hemostasis was achieved.    The colpotomy at the vaginal cuff was closed with Vicryl on a CT1 needle in a running manner.  Irrigation was used and excellent hemostasis was achieved.  At this point in the procedure was completed.  Robotic instruments were removed under direct visulaization.  The robot was undocked. The 10 mm ports were closed with Vicryl on a UR-5 needle and the fascia was closed with 0 Vicryl on a UR-5 needle.  The skin was closed with 4-0 Vicryl in a subcuticular manner.  Dermabond was applied.  Sponge, lap and needle counts correct x 2.  The patient was taken to the recovery room in stable condition.  The vagina was swabbed with  minimal bleeding noted.   All instrument  and needle counts were correct x  3.   The patient was transferred to the recovery room in a stable condition.  Donaciano Eva, MD

## 2018-08-21 ENCOUNTER — Telehealth: Payer: Self-pay | Admitting: *Deleted

## 2018-08-21 ENCOUNTER — Encounter (HOSPITAL_COMMUNITY): Payer: Self-pay | Admitting: Gynecologic Oncology

## 2018-08-21 NOTE — Telephone Encounter (Signed)
Pt doing very well post op. Afebrile. She is not in pain.  Using only tylenol for discomfort. Told Nicole Brennan that she can sleep on her side. She can have some dysuria after the surgery. Push fluids.  If the burning remains into Monday, call the office to see if she needs to give a urine specimen to check for infection. Her bowels may take a couple of days to move. She is passing gas. She will increase the senokot to bid.  If no bm by Sunday am she will take Miralax 1 capful bid prn. Instructed her to hold the laxitives if stools become too loose. Incisions look good no redness noted.  Pt pleased with progress thus far.

## 2018-08-21 NOTE — Telephone Encounter (Signed)
Patient called and asked several questions regarding post op. The patient stated "I'm doing well, really having no pain. I am still having some burning with urination, is this normal. How long will that last. I started the stool softner yesterday, but haven't has a bowel movement yet. And can I sleep on my side?" Explained that we were going to call her taody to check on her, so they will have her answers when they call her.

## 2018-08-26 ENCOUNTER — Telehealth: Payer: Self-pay | Admitting: Gynecologic Oncology

## 2018-08-26 ENCOUNTER — Telehealth: Payer: Self-pay | Admitting: *Deleted

## 2018-08-26 NOTE — Telephone Encounter (Signed)
Called and moved her webex appt from 5pm to 2pm

## 2018-08-26 NOTE — Telephone Encounter (Signed)
Confirmed appt and verified info. °

## 2018-08-27 ENCOUNTER — Inpatient Hospital Stay: Payer: Medicare Other | Attending: Gynecologic Oncology | Admitting: Gynecologic Oncology

## 2018-08-27 ENCOUNTER — Encounter: Payer: Self-pay | Admitting: Oncology

## 2018-08-27 DIAGNOSIS — C541 Malignant neoplasm of endometrium: Secondary | ICD-10-CM | POA: Diagnosis not present

## 2018-08-27 DIAGNOSIS — Z7189 Other specified counseling: Secondary | ICD-10-CM | POA: Diagnosis not present

## 2018-08-27 NOTE — Progress Notes (Signed)
Gynecologic Oncology Telehealth Consult Note: Gyn-Onc  I connected with [name] on 08/27/18 at  2:00 PM EDT by telephone and verified that I am speaking with the correct person using two identifiers.  I discussed the limitations, risks, security and privacy concerns of performing an evaluation and management service by telemedicine and the availability of in-person appointments. I also discussed with the patient that there may be a patient responsible charge related to this service. The patient expressed understanding and agreed to proceed.  Other persons participating in the visit and their role in the encounter: none.  Patient's location: home  Provider's location: [place]  Chief Complaint:  Chief Complaint  Patient presents with  . 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 I discussed that this is associated with a 20% risk of reduction which can be reduced to closer to 5% risk with the addition of vaginal brachytherapy, therefore we are recommending that.  She will see Dr Sondra Come to discuss this further.  She is doing well from surgery. We discussed activities and diet.  All questions answered.  Thereasa Solo, MD  I discussed the assessment and treatment plan with the patient. The patient was provided with an opportunity to ask questions and all were answered. The patient agreed with the plan and demonstrated an understanding of the instructions.   The patient was advised to call back or see an in-person evaluation if the symptoms worsen or if the condition fails to improve as anticipated.   I provided 20 minutes of face-to-face video visit time during this encounter, and > 50% was spent counseling as documented under my assessment & plan.   Thereasa Solo, MD

## 2018-09-01 ENCOUNTER — Encounter: Payer: Self-pay | Admitting: Internal Medicine

## 2018-09-01 ENCOUNTER — Other Ambulatory Visit: Payer: Self-pay

## 2018-09-01 ENCOUNTER — Ambulatory Visit (INDEPENDENT_AMBULATORY_CARE_PROVIDER_SITE_OTHER): Payer: Medicare Other | Admitting: Internal Medicine

## 2018-09-01 VITALS — BP 114/50 | HR 74 | Temp 97.8°F | Resp 16 | Ht 65.0 in | Wt 184.0 lb

## 2018-09-01 DIAGNOSIS — R21 Rash and other nonspecific skin eruption: Secondary | ICD-10-CM | POA: Diagnosis not present

## 2018-09-01 DIAGNOSIS — E038 Other specified hypothyroidism: Secondary | ICD-10-CM

## 2018-09-01 LAB — TSH: TSH: 2.18 u[IU]/mL (ref 0.35–4.50)

## 2018-09-01 MED ORDER — BETAMETHASONE DIPROPIONATE AUG 0.05 % EX CREA: TOPICAL_CREAM | Freq: Two times a day (BID) | CUTANEOUS | 0 refills | Status: DC

## 2018-09-01 MED FILL — BETAMETHASONE DP AUG 0.05%: 0.05 | 30 days supply | Qty: 60 | Fill #0

## 2018-09-01 NOTE — Patient Instructions (Signed)
I suspect the rashes are related to either poison ivy or insect bites.  Please apply the cream twice a day  for the next week.  Call if you are not gradually better

## 2018-09-01 NOTE — Progress Notes (Signed)
Pre visit review using our clinic review tool, if applicable. No additional management support is needed unless otherwise documented below in the visit note. 

## 2018-09-01 NOTE — Progress Notes (Signed)
GYN Location of Tumor / Histology: grade 1 endometrioid endometrial cancer.  [place] presented with symptoms of: The patient's history began in May 2020 when she began experiencing vaginal spotting and was seen by Dr. Murrell Redden performed a transvaginal ultrasound scan which showed a thickened endometrium which then prompted an endometrial biopsy which was performed on Jul 27, 2018.  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  Biopsies revealed: 08/20/18:  Diagnosis 1. Lymph node, sentinel, biopsy, right obturator - LYMPH NODE, NEGATIVE FOR CARCINOMA (0/1). SEE NOTE 2. Lymph node, sentinel, biopsy, left obturator - THREE LYMPH NODES, NEGATIVE FOR CARCINOMA (0/3). SEE NOTE 3. Uterus, cervix and bilateral fallopian tubes, and ovaries - ENDOMETRIOID ADENOCARCINOMA, FIGO GRADE 1, 4.5 CM, INVOLVING THE ENTIRE ENDOMETRIAL CAVITY - CARCINOMA INVADES ALMOST THE ENTIRE THICKNESS OF MYOMETRIUM-1.1 CM (> 50% MYOMETRIAL INVASION) - CARCINOMA ABUTS THE SEROSAL SURFACE BUT DOES NOT INVADE INTO IT (LESS THAN 1 MM) - RESECTION MARGINS ARE NEGATIVE FOR CARCINOMA - NEGATIVE FOR LYMPHOVASCULAR OR PERINEURAL INVASION - BENIGN UNREMARKABLE CERVIX - BENIGN UNREMARKABLE BILATERAL FALLOPIAN TUBES AND OVARIES - SEE ONCOLOGY TABLE Microscopic Comment 3. UTERUS, CARCINOMA OR CARCINOSARCOMA Procedure: Total hysterectomy with bilateral salpingo-oophorectomy Histologic type: Endometrioid adenocarcinoma Histologic Grade: FIGO grade 1 Myometrial invasion: Present Depth of invasion: 11 mm Myometrial thickness: 11 mm  Past/Anticipated interventions by Gyn/Onc surgery, if any: 08/20/18:  Operation: Robotic-assisted laparoscopic total hysterectomy with bilateral salpingoophorectomy, SLN biopsy   Surgeon: Donaciano Eva  Past/Anticipated interventions by medical oncology, if any: None at this time  Weight changes, if any:  Wt Readings from Last 3 Encounters:  09/02/18 184  lb 6.4 oz (83.6 kg)  09/01/18 184 lb (83.5 kg)  08/20/18 186 lb 9.6 oz (84.6 kg)     Bowel/Bladder complaints, if any: Pt denies dysuria/hematuria. Pt denies vaginal bleeding/discharge. Pt denies rectal bleeding, diarrhea/constipation.  Nausea/Vomiting, if any: Pt denies N/V, abdominal bloating  Pain issues, if any:  Pt denies c/o pain  SAFETY ISSUES:  Prior radiation? no  Pacemaker/ICD? no  Possible current pregnancy? no  Is the patient on methotrexate? no  Current Complaints / other details:  Pt presents today for initial consult with Dr. Sondra Come for Radiation Oncology.   BP 140/62 (BP Location: Left Arm, Patient Position: Sitting)   Pulse 73   Temp 97.7 F (36.5 C) (Oral)   Resp 20   Ht 5\' 5"  (1.651 m)   Wt 184 lb 6.4 oz (83.6 kg)   SpO2 98%   BMI 30.69 kg/m   Loma Sousa, RN BSN

## 2018-09-01 NOTE — Progress Notes (Signed)
Subjective:    Patient ID: Nicole Brennan, female    DOB: 24-Jul-1940, 78 y.o.   MRN: [id number]  DOS:  09/01/2018 Type of visit - description: Acute C/o  rash few days ago located on the left neck, + itching, rash was never blistery. Also few skin lesions at the hands, also very pruritic. When asked, admits that she works in the yard and she did so few days ago. Hypothyroidism: Requests a TSH check   Review of Systems No fever chills No chest pain no difficulty breathing  Past Medical History:  Diagnosis Date  . Allergy   . Arthritis   . Cataract   . Thyroid disease     Past Surgical History:  Procedure Laterality Date  . DG  BONE DENSITY ([place] HX)    . KNEE SURGERY    . ROBOTIC ASSISTED TOTAL HYSTERECTOMY WITH BILATERAL SALPINGO OOPHERECTOMY Bilateral 08/20/2018   Procedure: XI ROBOTIC ASSISTED TOTAL HYSTERECTOMY WITH BILATERAL SALPINGO OOPHORECTOMY;  Surgeon: Everitt Amber, MD;  Location: [facility];  Service: Gynecology;  Laterality: Bilateral;  . SENTINEL NODE BIOPSY N/A 08/20/2018   Procedure: SENTINEL LYMPH  NODE BIOPSY;  Surgeon: Everitt Amber, MD;  Location: [facility];  Service: Gynecology;  Laterality: N/A;  . TUBAL LIGATION      Social History   Socioeconomic History  . Marital status: Married    Spouse name: Not on file  . Number of children: Not on file  . Years of education: Not on file  . Highest education level: Not on file  Occupational History  . Not on file  Social Needs  . Financial resource strain: Not on file  . Food insecurity    Worry: Not on file    Inability: Not on file  . Transportation needs    Medical: Not on file    Non-medical: Not on file  Tobacco Use  . Smoking status: Never Smoker  . Smokeless tobacco: Never Used  Substance and Sexual Activity  . Alcohol use: No  . Drug use: No  . Sexual activity: Yes  Lifestyle  . Physical activity    Days per week: Not on file    Minutes per session: Not on file  . Stress: Not on file   Relationships  . Social Herbalist on phone: Not on file    Gets together: Not on file    Attends religious service: Not on file    Active member of club or organization: Not on file    Attends meetings of clubs or organizations: Not on file    Relationship status: Not on file  . Intimate partner violence    Fear of current or ex partner: Not on file    Emotionally abused: Not on file    Physically abused: Not on file    Forced sexual activity: Not on file  Other Topics Concern  . Not on file  Social History Narrative  . Not on file      Allergies as of 09/01/2018      Reactions   Penicillins Hives   Did it involve swelling of the face/tongue/throat, SOB, or low BP? No Did it involve sudden or severe rash/hives, skin peeling, or any reaction on the inside of your mouth or nose? Unknown Did you need to seek medical attention at a hospital or doctor's office? Yes When did it last happen?Childhood reaction If all above answers are "NO", may proceed with cephalosporin use.  Medication List       Accurate as of September 01, 2018 10:21 AM. If you have any questions, ask your nurse or doctor.        acetaminophen 500 MG tablet Commonly known as: TYLENOL Take 1,000 mg by mouth every 6 (six) hours as needed for headache.   Calcium Gummies 250-100-500 MG-MG-UNIT Chew Generic drug: Calcium-Phosphorus-Vitamin D Chew 2 tablets by mouth at bedtime.   ibuprofen 600 MG tablet Commonly known as: ADVIL Take 1 tablet (600 mg total) by mouth every 6 (six) hours as needed. For AFTER surgery   levothyroxine 75 MCG tablet Commonly known as: SYNTHROID Take 1 tablet (75 mcg total) by mouth daily.   multivitamin with minerals tablet Take 1 tablet by mouth every evening. Centrum Silver   OPTIVE 0.5-0.9 % ophthalmic solution Generic drug: carboxymethylcellul-glycerin Place 1 drop into both eyes 2 (two) times daily as needed for dry eyes.   oxyCODONE 5 MG immediate release  tablet Commonly known as: Oxy IR/ROXICODONE Take 1 tablet (5 mg total) by mouth every 4 (four) hours as needed for severe pain. For AFTER Surgery, do not take and drive   senna-docusate 8.6-50 MG tablet Commonly known as: Senokot-S Take 2 tablets by mouth at bedtime. For AFTER surgery, do not take if having diarrhea           Objective:   Physical Exam Neck:     BP (!) 114/50 (BP Location: Left Arm, Patient Position: Sitting, Cuff Size: Small)   Pulse 74   Temp 97.8 F (36.6 C) (Oral)   Resp 16   Ht 5\' 5"  (1.651 m)   Wt 184 lb (83.5 kg)   SpO2 99%   BMI 30.62 kg/m   General:   Well developed, NAD, BMI noted. HEENT:  Normocephalic . Face symmetric, atraumatic  Skin: See graphic Left hand has 1 and right hand has 2 skin lesions: Papular, 2 to 3 mm in size, slightly raised areas. Neurologic:  alert & oriented X3.  Speech normal, gait appropriate for age and unassisted Psych--  Cognition and judgment appear intact.  Cooperative with normal attention span and concentration.  Behavior appropriate. No anxious or depressed appearing.      Assessment     78 year old female, PMH includes thyroid disease, endometrial cancer, status post robotic assisted total hysterectomy w/ B oophorectomy 08/20/2018.  Presents with  Rash, neck: Poison ivy?  Recommend to be careful when she goes to the backyard, prescribed topical potent steroid. Skin rash, hands: Does not look like classic poison ivy, insect bite?  Recommend the same, topical steroids for a week. Call if no better Hypothyroidism: Check TSH per patient request, last TSH was almost a year ago.

## 2018-09-02 ENCOUNTER — Other Ambulatory Visit: Payer: Self-pay

## 2018-09-02 ENCOUNTER — Encounter: Payer: Self-pay | Admitting: Radiation Oncology

## 2018-09-02 ENCOUNTER — Ambulatory Visit
Admission: RE | Admit: 2018-09-02 | Discharge: 2018-09-02 | Disposition: A | Discharge status: Home / Self Care | Payer: Medicare Other | Source: Ambulatory Visit | Attending: Radiation Oncology | Admitting: Radiation Oncology

## 2018-09-02 VITALS — BP 140/62 | HR 73 | Temp 97.7°F | Resp 20 | Ht 65.0 in | Wt 184.4 lb

## 2018-09-02 DIAGNOSIS — Z79899 Other long term (current) drug therapy: Secondary | ICD-10-CM | POA: Diagnosis not present

## 2018-09-02 DIAGNOSIS — C541 Malignant neoplasm of endometrium: Secondary | ICD-10-CM

## 2018-09-02 DIAGNOSIS — Z90710 Acquired absence of both cervix and uterus: Secondary | ICD-10-CM | POA: Diagnosis not present

## 2018-09-02 DIAGNOSIS — Z90722 Acquired absence of ovaries, bilateral: Secondary | ICD-10-CM | POA: Insufficient documentation

## 2018-09-02 DIAGNOSIS — Z800 Family history of malignant neoplasm of digestive organs: Secondary | ICD-10-CM | POA: Insufficient documentation

## 2018-09-02 NOTE — Progress Notes (Signed)
Radiation Oncology         [phone] ________________________________  Initial Outpatient Consultation  Name: Nicole Brennan MRN: [id number]  Date: 09/02/2018  DOB: 1941-03-08  IO:MBTDHRC, Gay Filler, MD  Everitt Amber, MD   REFERRING PHYSICIAN: Everitt Amber, MD  DIAGNOSIS: The encounter diagnosis was Endometrial carcinoma [facility]).   FIGO Stage I-B, grade 1 endometrioid adenocarcinoma, pT1b, pN0   HISTORY OF PRESENT ILLNESS::Nicole Brennan is a 78 y.o. female who is presenting to the office today for evaluation of recently diagnosed endometrial cancer. She presented in May 2020 with vaginal spotting and was seen  by [facility] who performed a transvaginal ultrasound which showed a thickened endometrium. This then prompted an endometrial biopsy which was performed on Jul 27, 2018 and revealed FIGO grade 1 endometrioid adenocarcinoma, ER/PR+.  She underwent Robotic-assisted laparoscopic total hysterectomy with bilateral salpingoophorectomy, SLN biopsy on 08/20/18. Pathology was significant for endometrioid adenocarcinoma, grade 1B, involving the entire endometrial cavity (4.5 cm). The carcinoma invaded almost the entire thickness of the myometrium - 1.1 cm. Negative resection margins. 4 benign lymph nodes taken from both left and right obturators.   she reports associated numbness in her right thigh since surgery. she denies vaginal bleeding or discharge and any other symptoms.    PREVIOUS RADIATION THERAPY: No  PAST MEDICAL HISTORY:  has a past medical history of Allergy, Arthritis, Cataract, and Thyroid disease.    PAST SURGICAL HISTORY: Past Surgical History:  Procedure Laterality Date  . DG  BONE DENSITY ([place] HX)    . KNEE SURGERY    . ROBOTIC ASSISTED TOTAL HYSTERECTOMY WITH BILATERAL SALPINGO OOPHERECTOMY Bilateral 08/20/2018   Procedure: XI ROBOTIC ASSISTED TOTAL HYSTERECTOMY WITH BILATERAL SALPINGO OOPHORECTOMY;  Surgeon: Everitt Amber, MD;  Location: [facility];  Service: Gynecology;   Laterality: Bilateral;  . SENTINEL NODE BIOPSY N/A 08/20/2018   Procedure: SENTINEL LYMPH  NODE BIOPSY;  Surgeon: Everitt Amber, MD;  Location: [facility];  Service: Gynecology;  Laterality: N/A;  . TUBAL LIGATION      FAMILY HISTORY: family history includes Colon cancer in her mother; Dementia in her father; Vascular Disease in her mother.  SOCIAL HISTORY:  reports that she has never smoked. She has never used smokeless tobacco. She reports that she does not drink alcohol or use drugs.  ALLERGIES: Penicillins  MEDICATIONS:  Current Outpatient Medications  Medication Sig Dispense Refill  . acetaminophen (TYLENOL) 500 MG tablet Take 1,000 mg by mouth every 6 (six) hours as needed for headache.     . augmented betamethasone dipropionate (DIPROLENE-AF) 0.05 % cream Apply topically 2 (two) times daily. 60 g 0  . Calcium-Phosphorus-Vitamin D (CALCIUM GUMMIES) [phone] MG-MG-UNIT CHEW Chew 2 tablets by mouth at bedtime.     . carboxymethylcellul-glycerin (OPTIVE) 0.5-0.9 % ophthalmic solution Place 1 drop into both eyes 2 (two) times daily as needed for dry eyes.    Marland Kitchen levothyroxine (SYNTHROID, LEVOTHROID) 75 MCG tablet Take 1 tablet (75 mcg total) by mouth daily. 90 tablet 3  . Multiple Vitamins-Minerals (MULTIVITAMIN WITH MINERALS) tablet Take 1 tablet by mouth every evening. Centrum Silver    . ibuprofen (ADVIL) 600 MG tablet Take 1 tablet (600 mg total) by mouth every 6 (six) hours as needed. For AFTER surgery (Patient not taking: Reported on 09/01/2018) 30 tablet 0  . oxyCODONE (OXY IR/ROXICODONE) 5 MG immediate release tablet Take 1 tablet (5 mg total) by mouth every 4 (four) hours as needed for severe pain. For AFTER Surgery, do not take and drive (  Patient not taking: Reported on 09/01/2018) 10 tablet 0  . senna-docusate (SENOKOT-S) 8.6-50 MG tablet Take 2 tablets by mouth at bedtime. For AFTER surgery, do not take if having diarrhea (Patient not taking: Reported on 09/01/2018) 30 tablet 0   No  current facility-administered medications for this encounter.     REVIEW OF SYSTEMS:  A 10+ POINT REVIEW OF SYSTEMS WAS OBTAINED including neurology, dermatology, psychiatry, cardiac, respiratory, lymph, extremities, GI, GU, musculoskeletal, constitutional, reproductive, HEENT. All pertinent positives are noted in the HPI. All others are negative.    PHYSICAL EXAM:  height is 5\' 5"  (1.651 m) and weight is 184 lb 6.4 oz (83.6 kg). Her oral temperature is 97.7 F (36.5 C). Her blood pressure is 140/62 and her pulse is 73. Her respiration is 20 and oxygen saturation is 98%.   General: Alert and oriented, in no acute distress HEENT: Head is normocephalic. Extraocular movements are intact. Oropharynx is clear. Neck: Neck is supple, no palpable cervical or supraclavicular lymphadenopathy. Heart: Regular in rate and rhythm with no murmurs, rubs, or gallops. Chest: Clear to auscultation bilaterally, with no rhonchi, wheezes, or rales. Abdomen: Soft, nontender, nondistended, with no rigidity or guarding.  Scars along the abdominal area healing well without signs of drainage or infection Extremities: No cyanosis or edema. Lymphatics: see Neck Exam Skin: No concerning lesions. Musculoskeletal: symmetric strength and muscle tone throughout. Neurologic: Cranial nerves II through XII are grossly intact. No obvious focalities. Speech is fluent. Coordination is intact. Psychiatric: Judgment and insight are intact. Affect is appropriate. Pelvic examination deferred until simulation and planning day  ECOG = 1   LABORATORY DATA:  Lab Results  Component Value Date   WBC 6.6 08/11/2018   HGB 13.5 08/11/2018   HCT 42.9 08/11/2018   MCV 100.2 (H) 08/11/2018   PLT 374 08/11/2018   NEUTROABS 2.6 10/25/2013   Lab Results  Component Value Date   NA 142 08/11/2018   K 4.0 08/11/2018   CL 108 08/11/2018   CO2 27 08/11/2018   GLUCOSE 96 08/11/2018   CREATININE 0.90 08/11/2018   CALCIUM 9.8 08/11/2018       RADIOGRAPHY: No results found.    IMPRESSION: FIGO stage 1B, grade 1 endometrioid adenocarcinoma, (pT1b, pN0).  Given the findings of a deeply invasive tumor, the patient would be at risk for vaginal cuff recurrence and I would agree with Dr. Serita Grit recommendations for vaginal brachii therapy.  I discussed the overall treatment course side effects and potential toxicities of intracavitary brachii therapy treatments with the patient.  She appears to understand and wishes to proceed with planned course of treatment.  PLAN: Patient will be seen by Dr. Denman George on July 10 with pelvic exam at that time.  Assuming patient is healed well she will be scheduled for vaginal brachii therapy approximately 6 weeks postop.  Anticipate 5 high-dose-rate treatments directed at the vaginal cuff using iridium 192 as the high-dose-rate source.   ------------------------------------------------  Blair Promise, PhD, MD This document serves as a record of services personally performed by Gery Pray, MD. It was created on his behalf by Mary-Margaret Loma Messing, a trained medical scribe. The creation of this record is based on the scribe's personal observations and the provider's statements to them. This document has been checked and approved by the attending provider.

## 2018-09-02 NOTE — Patient Instructions (Signed)
Coronavirus (COVID-19) Are you at risk?  Are you at risk for the Coronavirus (COVID-19)?  To be considered HIGH RISK for Coronavirus (COVID-19), you have to meet the following criteria:  . Traveled to [place], [place], [place], [place] or [place]; or in the [place] to [place], [place], [place], or [place]; and have fever, cough, and shortness of breath within the last 2 weeks of travel OR . Been in close contact with a person diagnosed with COVID-19 within the last 2 weeks and have fever, cough, and shortness of breath . IF YOU DO NOT MEET THESE CRITERIA, YOU ARE CONSIDERED LOW RISK FOR COVID-19.  What to do if you are HIGH RISK for COVID-19?  . If you are having a medical emergency, call 911. . Seek medical care right away. Before you go to a doctor's office, urgent care or emergency department, call ahead and tell them about your recent travel, contact with someone diagnosed with COVID-19, and your symptoms. You should receive instructions from your physician's office regarding next steps of care.  . When you arrive at healthcare provider, tell the healthcare staff immediately you have returned from visiting [place], [place], [place], [place] or [place]; or traveled in the [place] to [place], [place], [place], or [place]; in the last two weeks or you have been in close contact with a person diagnosed with COVID-19 in the last 2 weeks.   . Tell the health care staff about your symptoms: fever, cough and shortness of breath. . After you have been seen by a medical provider, you will be either: o Tested for (COVID-19) and discharged home on quarantine except to seek medical care if symptoms worsen, and asked to  - Stay home and avoid contact with others until you get your results (4-5 days)  - Avoid travel on public transportation if possible (such as bus, train, or airplane) or o Sent to the Emergency Department by EMS for evaluation, COVID-19 testing, and possible  admission depending on your condition and test results.  What to do if you are LOW RISK for COVID-19?  Reduce your risk of any infection by using the same precautions used for avoiding the common cold or flu:  . Wash your hands often with soap and warm water for at least 20 seconds.  If soap and water are not readily available, use an alcohol-based hand sanitizer with at least 60% alcohol.  . If coughing or sneezing, cover your mouth and nose by coughing or sneezing into the elbow areas of your shirt or coat, into a tissue or into your sleeve (not your hands). . Avoid shaking hands with others and consider head nods or verbal greetings only. . Avoid touching your eyes, nose, or mouth with unwashed hands.  . Avoid close contact with people who are sick. . Avoid places or events with large numbers of people in one location, like concerts or sporting events. . Carefully consider travel plans you have or are making. . If you are planning any travel outside or inside the [place], visit the CDC's Travelers' Health webpage for the latest health notices. . If you have some symptoms but not all symptoms, continue to monitor at home and seek medical attention if your symptoms worsen. . If you are having a medical emergency, call 911.   ADDITIONAL HEALTHCARE OPTIONS FOR PATIENTS  [facility] / e-Visit: [email or web address]         [facility]: [phone]     Urgent Care: [phone]                   [facility]: [phone]   

## 2018-09-03 ENCOUNTER — Telehealth: Payer: Self-pay

## 2018-09-03 NOTE — Telephone Encounter (Signed)
Copied from [place] [id number]. Topic: Appointment Scheduling - Scheduling Inquiry for Clinic >> Sep 03, 2018 10:33 AM [name] wrote: Reason for CRM: pt called and wants to be worked in with dr Copland today.  I did explain to her that Dr Lorelei Pont did not have anything today and was sure if she could be worked in but she wanted me to send message anyway.  She stated that the med she was give this week from Dr Larose Kells Is not working and she has 2 other spots that have come up.  Please advise  Best number [phone]

## 2018-09-07 ENCOUNTER — Other Ambulatory Visit: Payer: Self-pay

## 2018-09-07 NOTE — Telephone Encounter (Signed)
Rash on hands saw dr. Larose Kells last week possibly poison okay, rash along waist, redness itching. Given cream but did not work. No other symptoms. No soreness only itches. Patient had zostavax several years ago.Patient scheduled to see PCP on Wednesday.

## 2018-09-08 NOTE — Progress Notes (Signed)
[facility] [address], [place], [place] [address] [phone]  Date:  09/09/2018   Name:  Nicole Brennan   DOB:  1940-12-10   MRN:  [id number]  PCP:  Darreld Mclean, MD    Chief Complaint: Insect Bite (rash around midline, redness, itching,  no pain over one week)   History of Present Illness:  Nicole Brennan is a 78 y.o. very pleasant female patient who presents with the following:  Office visit today for this very nice lady with history of endometrial cancer, osteoporosis, hypothyroidism. She just underwent total hysterectomy on June 11 per Dr. Denman George She has a removal of 4 benign nodes-diagnosed with stage Ib grade 1 endometrioid adenocarcinoma Her tumor was quite large, and they recommended vaginal Brachii therapy which should begin in about 6 weeks postop per Dr. Earney Hamburg.  The plan is for 5 treatments and this should complete her treatment  Here today with concern of an itchy rash around her waist.   She was seen by my partner Dr. Larose Kells for possible bug bites on her hands last week.  He recommended a topical steroid cream- this got better, what she has now is something different   She has tried all sorts of topicals including her steroid cream but these did not help  She does not know what might have caused this.  She has done just a little bit of work outdoors.  No suspicious foods The rash is only around her waistline and she feels find otherwise No oral lesions No fever  Lab Results  Component Value Date   TSH 2.18 09/01/2018     Patient Active Problem List   Diagnosis Date Noted  . Endometrial carcinoma ([place]) 08/05/2018  . Obesity (BMI 30.0-34.9) 08/05/2018  . Seasonal allergies 04/21/2012  . Cataract 04/21/2012  . Osteoporosis 05/01/2011  . Hypothyroidism 04/30/2011  . Osteoarthritis 04/30/2011    Past Medical History:  Diagnosis Date  . Allergy   . Arthritis   . Cataract   . Thyroid disease      Past Surgical History:  Procedure Laterality Date  . DG  BONE DENSITY ([place] HX)    . KNEE SURGERY    . ROBOTIC ASSISTED TOTAL HYSTERECTOMY WITH BILATERAL SALPINGO OOPHERECTOMY Bilateral 08/20/2018   Procedure: XI ROBOTIC ASSISTED TOTAL HYSTERECTOMY WITH BILATERAL SALPINGO OOPHORECTOMY;  Surgeon: Everitt Amber, MD;  Location: [facility];  Service: Gynecology;  Laterality: Bilateral;  . SENTINEL NODE BIOPSY N/A 08/20/2018   Procedure: SENTINEL LYMPH  NODE BIOPSY;  Surgeon: Everitt Amber, MD;  Location: [facility];  Service: Gynecology;  Laterality: N/A;  . TUBAL LIGATION      Social History   Tobacco Use  . Smoking status: Never Smoker  . Smokeless tobacco: Never Used  Substance Use Topics  . Alcohol use: No  . Drug use: No    Family History  Problem Relation Age of Onset  . Vascular Disease Mother   . Colon cancer Mother   . Dementia Father     Allergies  Allergen Reactions  . Penicillins Hives    Did it involve swelling of the face/tongue/throat, SOB, or low BP? No Did it involve sudden or severe rash/hives, skin peeling, or any reaction on the inside of your mouth or nose? Unknown Did you need to seek medical attention at a hospital or doctor's office? Yes When did it last happen?Childhood reaction If all above answers are "NO", may proceed  with cephalosporin use.     Medication list has been reviewed and updated.  Current Outpatient Medications on File Prior to Visit  Medication Sig Dispense Refill  . acetaminophen (TYLENOL) 500 MG tablet Take 1,000 mg by mouth every 6 (six) hours as needed for headache.     . augmented betamethasone dipropionate (DIPROLENE-AF) 0.05 % cream Apply topically 2 (two) times daily. 60 g 0  . Calcium-Phosphorus-Vitamin D (CALCIUM GUMMIES) [phone] MG-MG-UNIT CHEW Chew 2 tablets by mouth at bedtime.     . carboxymethylcellul-glycerin (OPTIVE) 0.5-0.9 % ophthalmic solution Place 1 drop into both eyes 2 (two) times daily as needed for dry eyes.     Marland Kitchen levothyroxine (SYNTHROID, LEVOTHROID) 75 MCG tablet Take 1 tablet (75 mcg total) by mouth daily. 90 tablet 3  . Multiple Vitamins-Minerals (MULTIVITAMIN WITH MINERALS) tablet Take 1 tablet by mouth every evening. Centrum Silver     No current facility-administered medications on file prior to visit.     Review of Systems:  As per HPI- otherwise negative. No fever or chills Besides itching she feels fine  Physical Examination: Vitals:   09/09/18 1320  BP: 122/60  Pulse: 81  Resp: 16  Temp: 98 F (36.7 C)  SpO2: 97%   Vitals:   09/09/18 1320  Weight: 184 lb (83.5 kg)  Height: 5\' 5"  (1.651 m)   Body mass index is 30.62 kg/m. Ideal Body Weight: Weight in (lb) to have BMI = 25: 149.9  GEN: WDWN, NAD, Non-toxic, A & O x 3, overweight, looks well  HEENT: Atraumatic, Normocephalic. Neck supple. No masses, No LAD.  Bilateral TM wnl, oropharynx normal.  PEERL,EOMI.   Ears and Nose: No external deformity. CV: RRR, No M/G/R. No JVD. No thrill. No extra heart sounds. PULM: CTA B, no wheezes, crackles, rhonchi. No retractions. No resp. distress. No accessory muscle use. ABD: S, NT, ND. No rebound. No HSM.  Belly is benign  EXTR: No c/c/e NEURO Normal gait.  PSYCH: Normally interactive. Conversant. Not depressed or anxious appearing.  Calm demeanor.  She has an erythematous, macular confluent rash with lacy edges on both her sides at her waist.  Not on her back.  No vesicles or other lesions.  Appears like a contact derm -?could be related to intraoperative draping used during her recent operation Assessment and Plan:   ICD-10-CM   1. Itching  L29.9 hydrOXYzine (ATARAX/VISTARIL) 10 MG tablet    predniSONE (DELTASONE) 20 MG tablet   Here today with an itchy rash on her abdomen.  She has tried topicals and they are not helpful.  The rash is really annoying rx for atarax to use as needed for itching rx for prednisone but will check with her cancer team prior to her using this med   Follow-up: No follow-ups on file.  Meds ordered this encounter  Medications  . hydrOXYzine (ATARAX/VISTARIL) 10 MG tablet    Sig: Take 1 tablet (10 mg total) by mouth 3 (three) times daily as needed for itching.    Dispense:  40 tablet    Refill:  0  . predniSONE (DELTASONE) 20 MG tablet    Sig: Take 40 mg daily for 3 days, then 20 mg daily for 5 days    Dispense:  11 tablet    Refill:  0    Please hold rx- we are checking on this with her oncologist first   No orders of the defined types were placed in this encounter.  I heard back from Dr. Denman George-  ok to use pred for this pt Will send message to her     Signed Lamar Blinks, MD

## 2018-09-09 ENCOUNTER — Ambulatory Visit (INDEPENDENT_AMBULATORY_CARE_PROVIDER_SITE_OTHER): Payer: Medicare Other | Admitting: Family Medicine

## 2018-09-09 ENCOUNTER — Encounter: Payer: Self-pay | Admitting: Family Medicine

## 2018-09-09 ENCOUNTER — Other Ambulatory Visit: Payer: Self-pay

## 2018-09-09 ENCOUNTER — Telehealth: Payer: Self-pay | Admitting: *Deleted

## 2018-09-09 VITALS — BP 122/60 | HR 81 | Temp 98.0°F | Resp 16 | Ht 65.0 in | Wt 184.0 lb

## 2018-09-09 DIAGNOSIS — L299 Pruritus, unspecified: Secondary | ICD-10-CM

## 2018-09-09 MED ORDER — HYDROXYZINE HCL 10 MG PO TABS: 10.0000 mg | ORAL_TABLET | Freq: Three times a day (TID) | ORAL | 0 refills | Status: DC | PRN

## 2018-09-09 MED ORDER — PREDNISONE 20 MG PO TABS: ORAL_TABLET | ORAL | 0 refills | Status: DC

## 2018-09-09 MED FILL — hydrOXYzine HCL 10 MG TABS: 10 | 13 days supply | Qty: 40 | Fill #0

## 2018-09-09 MED FILL — predniSONE 20 MG TABS: 20 | 8 days supply | Qty: 11 | Fill #0

## 2018-09-09 NOTE — Telephone Encounter (Signed)
CALLED PATIENT TO INFORM OF NEW HDR [place] , STARTING ON 09-30-18 AND TXS. ON 10-07-18, 10-14-18, 10-21-18, AND 10-28-18, SPOKE WITH PATIENT AND SHE IS AWARE OF THESE APPTS.

## 2018-09-09 NOTE — Patient Instructions (Signed)
Good to see you doing so well today- despite your rash! I prescribed hydroxyzine for you to use as needed for itching- it may cause some drowsiness so please be aware I think we can also use an oral steroid- prednisone- for you but want to check with Dr. Denman George first.  She should reply to me soon and I will let you know what she says If any worsening in the meantime please let me know

## 2018-09-10 ENCOUNTER — Telehealth: Payer: Self-pay

## 2018-09-10 NOTE — Telephone Encounter (Signed)
Returned VM. Pt not starting [facility] [facility]DR until 7/22. Pt has been prescribed 5 day course of Prednisone and requests "return my call as soon as absolutely possible".  Contacted pt and conveyed that it was not contra-indicated for pt to take a course of Prednisone. Pt verbalized understanding. Loma Sousa, RN BSN

## 2018-09-18 ENCOUNTER — Encounter: Payer: Self-pay | Admitting: Gynecologic Oncology

## 2018-09-18 ENCOUNTER — Inpatient Hospital Stay: Payer: Medicare Other | Attending: Gynecologic Oncology | Admitting: Gynecologic Oncology

## 2018-09-18 ENCOUNTER — Other Ambulatory Visit: Payer: Self-pay

## 2018-09-18 VITALS — BP 131/53 | HR 75 | Temp 98.3°F | Resp 17 | Ht 65.0 in | Wt 187.4 lb

## 2018-09-18 DIAGNOSIS — Z90710 Acquired absence of both cervix and uterus: Secondary | ICD-10-CM | POA: Insufficient documentation

## 2018-09-18 DIAGNOSIS — E039 Hypothyroidism, unspecified: Secondary | ICD-10-CM | POA: Diagnosis not present

## 2018-09-18 DIAGNOSIS — N736 Female pelvic peritoneal adhesions (postinfective): Secondary | ICD-10-CM

## 2018-09-18 DIAGNOSIS — C541 Malignant neoplasm of endometrium: Secondary | ICD-10-CM | POA: Insufficient documentation

## 2018-09-18 DIAGNOSIS — E669 Obesity, unspecified: Secondary | ICD-10-CM | POA: Insufficient documentation

## 2018-09-18 DIAGNOSIS — Z7189 Other specified counseling: Secondary | ICD-10-CM

## 2018-09-18 DIAGNOSIS — Z800 Family history of malignant neoplasm of digestive organs: Secondary | ICD-10-CM | POA: Diagnosis not present

## 2018-09-18 DIAGNOSIS — Z90721 Acquired absence of ovaries, unilateral: Secondary | ICD-10-CM

## 2018-09-18 NOTE — Progress Notes (Signed)
[name]  Chief Complaint:  Chief Complaint  Patient presents with  . 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 I discussed that this is associated with a 20% risk of reduction which can be reduced to closer to 5% risk with the addition of vaginal brachytherapy, therefore we are recommending that.  She is scheduled for radiation to begin in late July, 2020. I will see her back after this time.  Assessment/Plan:  Nicole Brennan  is a 78 y.o.  year old with stage IB grade 1 endometrial cancer.  High/intermediate risk factors for recurrence. Recommendation is for vaginal brachytherapy to reduce risk for local recurrence in accordance with NCCN guidelines.  I discussed this with the patient. I discussed the role of adjuvant therapy. I discussed prognosis and risk for recurrence. We reviewed symptoms concerning for recurrence and she will see me if these develop prior to her scheduled appointment.  After completing adjuvant therapy I recommend she follow-up at 3 monthly intervals for symptom review, physical examination and pelvic examination. Pap smear is not recommended in routine endometrial cancer surveillance. After 2 years we will space these visits to every 6 months, and then annually if recurrence has not developed within 5 years. All questions were answered.   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 The patient's history began in May 2020 when she began experiencing vaginal spotting and was seen by Dr. Murrell Redden performed a transvaginal ultrasound scan which showed a thickened endometrium which then prompted an endometrial biopsy which was performed on Jul 27, 2018.  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  The patient  is an otherwise fairly healthy woman.  She is obese by BMI definition with a BMI of 31 kg/m.  She has hypothyroidism.  Her only prior abdominal surgery is a tubal ligation.  Family history is remarkable for mother with colon cancer.  Patient is nulliparous.  She has no history of exogenous hormone use.  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  Interval Hx:  On August 20, 2018 she underwent a robotic assisted total hysterectomy, BSO sentinel lymph node biopsy.  Intraoperative findings were unremarkable for extrauterine disease.  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  Sentinel lymph nodes were negative for metastatic disease.  The cervix, and adnexa are also negative for metastatic disease.  There was no lymphovascular or perineural invasion seen.  Due to her deep myometrial invasion and at her age she met criteria for intermediate to high risk for recurrence.  She was recommended adjuvant radiation to the vaginal cuff in accordance with NCCN guidelines.  Postoperatively she did very well and required no postoperative analgesia.  Current Meds:  Outpatient Encounter Medications as of 09/18/2018  Medication Sig  . acetaminophen (TYLENOL) 500 MG tablet Take 1,000 mg by mouth every 6 (six) hours as needed for headache.   . augmented betamethasone dipropionate (DIPROLENE-AF) 0.05 % cream Apply topically 2 (two) times daily.  . Calcium-Phosphorus-Vitamin D (CALCIUM GUMMIES) [phone] MG-MG-UNIT CHEW Chew 2 tablets by mouth at bedtime.   . carboxymethylcellul-glycerin (OPTIVE) 0.5-0.9 % ophthalmic solution Place 1 drop into both eyes 2 (two)  times daily as needed for dry eyes.  . hydrOXYzine (ATARAX/VISTARIL) 10 MG tablet Take 1 tablet (10 mg total) by mouth 3 (three) times daily as needed for itching.  . levothyroxine (SYNTHROID,  LEVOTHROID) 75 MCG tablet Take 1 tablet (75 mcg total) by mouth daily.  . Multiple Vitamins-Minerals (MULTIVITAMIN WITH MINERALS) tablet Take 1 tablet by mouth every evening. Centrum Silver  . predniSONE (DELTASONE) 20 MG tablet Take 40 mg daily for 3 days, then 20 mg daily for 5 days   No facility-administered encounter medications on file as of 09/18/2018.     Allergy:  Allergies  Allergen Reactions  . Penicillins Hives    Did it involve swelling of the face/tongue/throat, SOB, or low BP? No Did it involve sudden or severe rash/hives, skin peeling, or any reaction on the inside of your mouth or nose? Unknown Did you need to seek medical attention at a hospital or doctor's office? Yes When did it last happen?Childhood reaction If all above answers are "NO", may proceed with cephalosporin use.     Social Hx:   Social History   Socioeconomic History  . Marital status: Married    Spouse name: Not on file  . Number of children: Not on file  . Years of education: Not on file  . Highest education level: Not on file  Occupational History  . Not on file  Social Needs  . Financial resource strain: Not on file  . Food insecurity    Worry: Not on file    Inability: Not on file  . Transportation needs    Medical: Not on file    Non-medical: Not on file  Tobacco Use  . Smoking status: Never Smoker  . Smokeless tobacco: Never Used  Substance and Sexual Activity  . Alcohol use: No  . Drug use: No  . Sexual activity: Yes  Lifestyle  . Physical activity    Days per week: Not on file    Minutes per session: Not on file  . Stress: Not on file  Relationships  . Social Herbalist on phone: Not on file    Gets together: Not on file    Attends religious service: Not on file    Active member of club or organization: Not on file    Attends meetings of clubs or organizations: Not on file    Relationship status: Not on file  . Intimate partner violence    Fear of current or  ex partner: Not on file    Emotionally abused: Not on file    Physically abused: Not on file    Forced sexual activity: Not on file  Other Topics Concern  . Not on file  Social History Narrative  . Not on file    Past Surgical Hx:  Past Surgical History:  Procedure Laterality Date  . DG  BONE DENSITY ([place] [facility]X)    . KNEE SURGERY    . ROBOTIC ASSISTED TOTAL HYSTERECTOMY WITH BILATERAL SALPINGO OOPHERECTOMY Bilateral 08/20/2018   Procedure: XI ROBOTIC ASSISTED TOTAL HYSTERECTOMY WITH BILATERAL SALPINGO OOPHORECTOMY;  Surgeon: Everitt Amber, MD;  Location: [facility];  Service: Gynecology;  Laterality: Bilateral;  . SENTINEL NODE BIOPSY N/A 08/20/2018   Procedure: SENTINEL LYMPH  NODE BIOPSY;  Surgeon: Everitt Amber, MD;  Location: [facility];  Service: Gynecology;  Laterality: N/A;  . TUBAL LIGATION      Past Medical Hx:  Past Medical History:  Diagnosis Date  . Allergy   . Arthritis   .  Cataract   . Thyroid disease     Past Gynecological History:  See HPI No LMP recorded. Patient has had a hysterectomy.  Family Hx:  Family History  Problem Relation Age of Onset  . Vascular Disease Mother   . Colon cancer Mother   . Dementia Father     Review of Systems:  Constitutional  Feels well,    ENT Normal appearing ears and nares bilaterally Skin/Breast  No rash, sores, jaundice, itching, dryness Cardiovascular  No chest pain, shortness of breath, or edema  Pulmonary  No cough or wheeze.  Gastro Intestinal  No nausea, vomitting, or diarrhoea. No bright red blood per rectum, no abdominal pain, change in bowel movement, or constipation.  Genito Urinary  No frequency, urgency, dysuria,no bleeding Musculo Skeletal  No myalgia, arthralgia, joint swelling or pain  Neurologic  No weakness, numbness, change in gait,  Psychology  No depression, anxiety, insomnia.   Vitals:  Blood pressure (!) 131/53, pulse 75, temperature 98.3 F (36.8 C), temperature source Oral, resp. rate 17, height  5' 5"  (1.651 m), weight 187 lb 6.4 oz (85 kg), SpO2 99 %.  Physical Exam: WD in NAD Neck  Supple NROM, without any enlargements.  Lymph Node Survey No cervical supraclavicular or inguinal adenopathy Cardiovascular  Pulse normal rate, regularity and rhythm. S1 and S2 normal.  Lungs  Clear to auscultation bilateraly, without wheezes/crackles/rhonchi. Good air movement.  Skin  No rash/lesions/breakdown  Psychiatry  Alert and oriented to person, place, and time  Abdomen  Normoactive bowel sounds, abdomen soft, non-tender and mildly obese without evidence of hernia.  Back No CVA tenderness Genito Urinary  Vulva/vagina: Normal external female genitalia.   No lesions. No discharge or bleeding.  Bladder/urethra:  No lesions or masses, well supported bladder  Vaginal cuff: smooth, in tact, healing normally.   Cervix and uterus: surgically absent  Adnexa: no palpable masses. Rectal  deferred Extremities  No bilateral cyanosis, clubbing or edema.   Thereasa Solo, MD  09/18/2018, 3:42 PM  30 minutes of direct face to face counseling time was spent with the patient. This included discussion about prognosis, therapy recommendations and postoperative side effects and are beyond the scope of routine postoperative care.

## 2018-09-18 NOTE — Patient Instructions (Signed)
Dr Denman George is recommending radiation with Dr Sondra Come for your intermediate risk, stage I endometrial cancer.   Please notify Dr Denman George at phone number [phone] if you notice vaginal bleeding, new pelvic or abdominal pains, bloating, feeling full easy, or a change in bladder or bowel function.   Dr Sondra Come will schedule you follow-up with Dr Denman George 3 months after completing treatments.

## 2018-09-29 ENCOUNTER — Telehealth: Payer: Self-pay | Admitting: *Deleted

## 2018-09-29 NOTE — Telephone Encounter (Signed)
Called patient to remind of New HDR [facility] for 09-30-18 - arrival time- 7:45 am for registration, spoke with patient and she is aware of these appts.

## 2018-09-30 ENCOUNTER — Ambulatory Visit
Admission: RE | Admit: 2018-09-30 | Discharge: 2018-09-30 | Disposition: A | Discharge status: Home / Self Care | Payer: Medicare Other | Source: Ambulatory Visit | Attending: Radiation Oncology | Admitting: Radiation Oncology

## 2018-09-30 ENCOUNTER — Encounter: Payer: Self-pay | Admitting: Radiation Oncology

## 2018-09-30 ENCOUNTER — Other Ambulatory Visit: Payer: Self-pay

## 2018-09-30 VITALS — BP 137/69 | HR 72 | Temp 97.7°F | Resp 18 | Ht 65.0 in | Wt 185.4 lb

## 2018-09-30 DIAGNOSIS — C541 Malignant neoplasm of endometrium: Secondary | ICD-10-CM

## 2018-09-30 DIAGNOSIS — Z79899 Other long term (current) drug therapy: Secondary | ICD-10-CM | POA: Diagnosis not present

## 2018-09-30 DIAGNOSIS — K5900 Constipation, unspecified: Secondary | ICD-10-CM | POA: Insufficient documentation

## 2018-09-30 DIAGNOSIS — Z510 Encounter for antineoplastic radiation therapy: Secondary | ICD-10-CM | POA: Diagnosis not present

## 2018-09-30 NOTE — Progress Notes (Signed)
Pt presents today for new [place] HDR with Dr. Sondra Come. Pt denies c/o pain. Pt denies dysuria/hematuria. Pt denies vaginal bleeding/discharge. Pt denies rectal bleeding. Pt reports very rare constipation. Pt denies abdominal bloating, N/V.  BP 137/69 (BP Location: Left Arm, Patient Position: Sitting)   Pulse 72   Temp 97.7 F (36.5 C) (Temporal)   Resp 18   Ht 5\' 5"  (1.651 m)   Wt 185 lb 6 oz (84.1 kg)   SpO2 98%   BMI 30.85 kg/m   Wt Readings from Last 3 Encounters:  09/30/18 185 lb 6 oz (84.1 kg)  09/18/18 187 lb 6.4 oz (85 kg)  09/09/18 184 lb (83.5 kg)   Loma Sousa, RN BSN

## 2018-09-30 NOTE — Progress Notes (Signed)
  Radiation Oncology         [phone] ________________________________  Name: WISDOM SEYBOLD MRN: [id number]  Date: 09/30/2018  DOB: [birth year]  SIMULATION AND TREATMENT PLANNING NOTE HDR BRACHYTHERAPY  DIAGNOSIS:  FIGO Stage I-B, grade 1 endometrioid adenocarcinoma, pT1b, pN0  NARRATIVE:  The patient was brought to the [place] suite.  Identity was confirmed.  All relevant records and images related to the planned course of therapy were reviewed.  The patient freely provided informed written consent to proceed with treatment after reviewing the details related to the planned course of therapy. The consent form was witnessed and verified by the simulation staff.  Then, the patient was set-up in a stable reproducible  supine position for radiation therapy.  CT images were obtained.  Surface markings were placed.  The CT images were loaded into the planning software.  Then the target and avoidance structures were contoured.  Treatment planning then occurred.  The radiation prescription was entered and confirmed.   I have requested : Brachytherapy Isodose Plan and Dosimetry Calculations to plan the radiation distribution.    PLAN:  The patient will receive 30 Gy in 5 fractions.  Patient will be treated with a 2.5 cm diameter cylinder with a treatment length of 3.5 cm.  Iridium 192 will be the high-dose-rate source.    ________________________________  Blair Promise, PhD, MD   This document serves as a record of services personally performed by Gery Pray, MD. It was created on his behalf by Wilburn Mylar, a trained medical scribe. The creation of this record is based on the scribe's personal observations and the provider's statements to them. This document has been checked and approved by the attending provider.

## 2018-09-30 NOTE — Progress Notes (Signed)
  Radiation Oncology         [phone] ________________________________  Name: Nicole Brennan MRN: [id number]  Date: 09/30/2018  DOB: [birth year]  CC: Copland, Gay Filler, MD  Copland, Gay Filler, MD  HDR BRACHYTHERAPY NOTE  DIAGNOSIS: FIGOStage I-B, grade 1endometrioid adenocarcinoma, pT1b, pN0   Simple treatment device note: Patient had construction of her custom vaginal cylinder. She will be treated with a 2.5 cm diameter segmented cylinder. This conforms to her anatomy without undue discomfort.  Vaginal brachytherapy procedure node: The patient was brought to the [place] suite. Identity was confirmed. All relevant records and images related to the planned course of therapy were reviewed. The patient freely provided informed written consent to proceed with treatment after reviewing the details related to the planned course of therapy. The consent form was witnessed and verified by the simulation staff. Then, the patient was set-up in a stable reproducible supine position for radiation therapy. Pelvic exam revealed the vaginal cuff to be intact. The patient's custom vaginal cylinder was placed in the proximal vagina. This was affixed to the CT/MR stabilization plate to prevent slippage. Patient tolerated the placement well.  Verification simulation note:  A fiducial marker was placed within the vaginal cylinder. An AP and lateral film was then obtained through the pelvis area. This documented accurate position of the vaginal cylinder for treatment.  HDR BRACHYTHERAPY TREATMENT  The remote afterloading device was affixed to the vaginal cylinder by catheter. Patient then proceeded to undergo her first high-dose-rate treatment directed at the proximal vagina. The patient was prescribed a dose of 6 gray to be delivered to the mucosal surface. Treatment length was 3.5 cm. Patient was treated with 1 channel using 8 dwell positions. Treatment time was 164.90 seconds. Iridium 192 was the high-dose-rate  source for treatment. The patient tolerated the treatment well. After completion of her therapy, a radiation survey was performed documenting return of the iridium source into the [employer] safe.   PLAN: She will return in one week for her second high-dose-rate treatment.  ________________________________  Blair Promise, PhD, MD   This document serves as a record of services personally performed by Gery Pray, MD. It was created on his behalf by Wilburn Mylar, a trained medical scribe. The creation of this record is based on the scribe's personal observations and the provider's statements to them. This document has been checked and approved by the attending provider.

## 2018-09-30 NOTE — Progress Notes (Signed)
Radiation Oncology         [phone] ________________________________  Name: Nicole Brennan MRN: [id number]  Date: 09/30/2018  DOB: 1941/01/27  Vaginal Brachytherapy Procedure Note  CC: Copland, Gay Filler, MD Copland, Gay Filler, MD    ICD-10-CM   1. Endometrial carcinoma (HCC)  C54.1     Diagnosis: FIGO Stage I-B, grade 1 endometrioid adenocarcinoma, pT1b, pN0  Narrative: She returns today for vaginal cylinder fitting. She denies pain, dysuria or hematuria, vaginal bleeding or discharge, rectal bleeding, abdominal bloating, and nausea or vomiting. She reports very rare constipation.  ALLERGIES: is allergic to penicillins.  Meds: Current Outpatient Medications  Medication Sig Dispense Refill  . acetaminophen (TYLENOL) 500 MG tablet Take 1,000 mg by mouth every 6 (six) hours as needed for headache.     . Calcium-Phosphorus-Vitamin D (CALCIUM GUMMIES) [phone] MG-MG-UNIT CHEW Chew 2 tablets by mouth at bedtime.     . carboxymethylcellul-glycerin (OPTIVE) 0.5-0.9 % ophthalmic solution Place 1 drop into both eyes 2 (two) times daily as needed for dry eyes.    . hydrOXYzine (ATARAX/VISTARIL) 10 MG tablet Take 1 tablet (10 mg total) by mouth 3 (three) times daily as needed for itching. 40 tablet 0  . levothyroxine (SYNTHROID, LEVOTHROID) 75 MCG tablet Take 1 tablet (75 mcg total) by mouth daily. 90 tablet 3  . Multiple Vitamins-Minerals (MULTIVITAMIN WITH MINERALS) tablet Take 1 tablet by mouth every evening. Centrum Silver    . augmented betamethasone dipropionate (DIPROLENE-AF) 0.05 % cream Apply topically 2 (two) times daily. (Patient not taking: Reported on 09/30/2018) 60 g 0  . predniSONE (DELTASONE) 20 MG tablet Take 40 mg daily for 3 days, then 20 mg daily for 5 days (Patient not taking: Reported on 09/30/2018) 11 tablet 0   No current facility-administered medications for this encounter.     Physical Findings: The patient is in no acute distress. Patient is alert and  oriented.  height is 5\' 5"  (1.651 m) and weight is 185 lb 6 oz (84.1 kg). Her temporal temperature is 97.7 F (36.5 C). Her blood pressure is 137/69 and her pulse is 72. Her respiration is 18 and oxygen saturation is 98%.   No palpable cervical, supraclavicular or axillary lymphoadenopathy. The heart has a regular rate and rhythm. The lungs are clear to auscultation. Abdomen soft and non-tender.  On pelvic examination the external genitalia were unremarkable. A speculum exam was performed. Vaginal cuff intact, no mucosal lesions. On bimanual exam there were no pelvic masses appreciated.  Lab Findings: Lab Results  Component Value Date   WBC 6.6 08/11/2018   HGB 13.5 08/11/2018   HCT 42.9 08/11/2018   MCV 100.2 (H) 08/11/2018   PLT 374 08/11/2018    Radiographic Findings: No results found.  Impression: FIGO Stage I-B, grade 1 endometrioid adenocarcinoma, pT1b, pN0  Patient was fitted for a vaginal cylinder. The patient will be treated with a 2.5 cm diameter cylinder with a treatment length of 3.5 cm. This distended the vaginal vault without undue discomfort. The patient tolerated the procedure well.  The patient was successfully fitted for a vaginal cylinder. The patient is appropriate to begin vaginal brachytherapy.   Plan: The patient will proceed with CT simulation and vaginal brachytherapy today.    _______________________________   Blair Promise, PhD, MD  This document serves as a record of services personally performed by Gery Pray, MD. It was created on his behalf by Wilburn Mylar, a trained medical scribe. The creation of this record is based  on the scribe's personal observations and the provider's statements to them. This document has been checked and approved by the attending provider.

## 2018-09-30 NOTE — Patient Instructions (Signed)
Coronavirus (COVID-19) Are you at risk?  Are you at risk for the Coronavirus (COVID-19)?  To be considered HIGH RISK for Coronavirus (COVID-19), you have to meet the following criteria:  . Traveled to [place], [place], [place], [place] or [place]; or in the [place] to [place], [place], [place], or [place]; and have fever, cough, and shortness of breath within the last 2 weeks of travel OR . Been in close contact with a person diagnosed with COVID-19 within the last 2 weeks and have fever, cough, and shortness of breath . IF YOU DO NOT MEET THESE CRITERIA, YOU ARE CONSIDERED LOW RISK FOR COVID-19.  What to do if you are HIGH RISK for COVID-19?  . If you are having a medical emergency, call 911. . Seek medical care right away. Before you go to a doctor's office, urgent care or emergency department, call ahead and tell them about your recent travel, contact with someone diagnosed with COVID-19, and your symptoms. You should receive instructions from your physician's office regarding next steps of care.  . When you arrive at healthcare provider, tell the healthcare staff immediately you have returned from visiting [place], [place], [place], [place] or [place]; or traveled in the [place] to [place], [place], [place], or [place]; in the last two weeks or you have been in close contact with a person diagnosed with COVID-19 in the last 2 weeks.   . Tell the health care staff about your symptoms: fever, cough and shortness of breath. . After you have been seen by a medical provider, you will be either: o Tested for (COVID-19) and discharged home on quarantine except to seek medical care if symptoms worsen, and asked to  - Stay home and avoid contact with others until you get your results (4-5 days)  - Avoid travel on public transportation if possible (such as bus, train, or airplane) or o Sent to the Emergency Department by EMS for evaluation, COVID-19 testing, and possible  admission depending on your condition and test results.  What to do if you are LOW RISK for COVID-19?  Reduce your risk of any infection by using the same precautions used for avoiding the common cold or flu:  . Wash your hands often with soap and warm water for at least 20 seconds.  If soap and water are not readily available, use an alcohol-based hand sanitizer with at least 60% alcohol.  . If coughing or sneezing, cover your mouth and nose by coughing or sneezing into the elbow areas of your shirt or coat, into a tissue or into your sleeve (not your hands). . Avoid shaking hands with others and consider head nods or verbal greetings only. . Avoid touching your eyes, nose, or mouth with unwashed hands.  . Avoid close contact with people who are sick. . Avoid places or events with large numbers of people in one location, like concerts or sporting events. . Carefully consider travel plans you have or are making. . If you are planning any travel outside or inside the [place], visit the CDC's Travelers' Health webpage for the latest health notices. . If you have some symptoms but not all symptoms, continue to monitor at home and seek medical attention if your symptoms worsen. . If you are having a medical emergency, call 911.   ADDITIONAL HEALTHCARE OPTIONS FOR PATIENTS  [facility] / e-Visit: [email or web address]         [facility]: [phone]  [facility]: [phone]                   [facility]: [phone]   

## 2018-10-06 ENCOUNTER — Telehealth: Payer: Self-pay | Admitting: *Deleted

## 2018-10-06 NOTE — Telephone Encounter (Signed)
Called patient to remind of [place]. for 10-07-18 @ 10 am, spoke with patient and she is aware of this tx.

## 2018-10-07 ENCOUNTER — Other Ambulatory Visit: Payer: Self-pay

## 2018-10-07 ENCOUNTER — Ambulatory Visit
Admission: RE | Admit: 2018-10-07 | Discharge: 2018-10-07 | Disposition: A | Discharge status: Home / Self Care | Payer: Medicare Other | Source: Ambulatory Visit | Attending: Radiation Oncology | Admitting: Radiation Oncology

## 2018-10-07 DIAGNOSIS — C541 Malignant neoplasm of endometrium: Secondary | ICD-10-CM

## 2018-10-07 DIAGNOSIS — Z510 Encounter for antineoplastic radiation therapy: Secondary | ICD-10-CM | POA: Diagnosis not present

## 2018-10-07 NOTE — Progress Notes (Signed)
  Radiation Oncology         [phone] ________________________________  Name: Nicole Brennan MRN: [id number]  Date: 10/07/2018  DOB: 1940/06/06  CC: Copland, Gay Filler, MD  Copland, Gay Filler, MD  HDR BRACHYTHERAPY NOTE  DIAGNOSIS: FIGOStage I-B, grade 1endometrioid adenocarcinoma, pT1b, pN0   Simple treatment device note: Patient had construction of her custom vaginal cylinder. She will be treated with a 2.5 cm diameter segmented cylinder. This conforms to her anatomy without undue discomfort.  Vaginal brachytherapy procedure node: The patient was brought to the [place] suite. Identity was confirmed. All relevant records and images related to the planned course of therapy were reviewed. The patient freely provided informed written consent to proceed with treatment after reviewing the details related to the planned course of therapy. The consent form was witnessed and verified by the simulation staff. Then, the patient was set-up in a stable reproducible supine position for radiation therapy. Pelvic exam revealed the vaginal cuff to be intact. The patient's custom vaginal cylinder was placed in the proximal vagina. This was affixed to the CT/MR stabilization plate to prevent slippage. Patient tolerated the placement well.  Verification simulation note:  A fiducial marker was placed within the vaginal cylinder. An AP and lateral film was then obtained through the pelvis area. This documented accurate position of the vaginal cylinder for treatment.  HDR BRACHYTHERAPY TREATMENT  The remote afterloading device was affixed to the vaginal cylinder by catheter. Patient then proceeded to undergo her second high-dose-rate treatment directed at the proximal vagina. The patient was prescribed a dose of 6 gray to be delivered to the mucosal surface. Treatment length was 3.5 cm. Patient was treated with 1 channel using 8 dwell positions. Treatment time was 176.2 seconds. Iridium 192 was the high-dose-rate  source for treatment. The patient tolerated the treatment well. After completion of her therapy, a radiation survey was performed documenting return of the iridium source into the GammaMed safe.   PLAN: She will return in one week on 10/14/18 for her third high-dose-rate treatment.  ________________________________  Blair Promise, PhD, MD   This document serves as a record of services personally performed by Gery Pray, MD. It was created on his behalf by Mary-Margaret Loma Messing, a trained medical scribe. The creation of this record is based on the scribe's personal observations and the provider's statements to them. This document has been checked and approved by the attending provider.

## 2018-10-13 ENCOUNTER — Telehealth: Payer: Self-pay | Admitting: *Deleted

## 2018-10-13 NOTE — Telephone Encounter (Signed)
CALLED PATIENT TO REMIND OF HDR TX. FOR 10-14-18 @ 9AM, SPOKE WITH PATIENT AND SHE IS AWARE OF THIS [place].

## 2018-10-14 ENCOUNTER — Other Ambulatory Visit: Payer: Self-pay

## 2018-10-14 ENCOUNTER — Ambulatory Visit
Admission: RE | Admit: 2018-10-14 | Discharge: 2018-10-14 | Disposition: A | Discharge status: Home / Self Care | Payer: Medicare Other | Source: Ambulatory Visit | Attending: Radiation Oncology | Admitting: Radiation Oncology

## 2018-10-14 DIAGNOSIS — C541 Malignant neoplasm of endometrium: Secondary | ICD-10-CM | POA: Insufficient documentation

## 2018-10-14 DIAGNOSIS — Z510 Encounter for antineoplastic radiation therapy: Secondary | ICD-10-CM | POA: Diagnosis not present

## 2018-10-14 NOTE — Progress Notes (Signed)
  Radiation Oncology         [phone] ________________________________  Name: AHNIKA HANNIBAL MRN: [id number]  Date: 10/14/2018  DOB: 11/28/1940  CC: Copland, Gay Filler, MD  Copland, Gay Filler, MD  HDR BRACHYTHERAPY NOTE  DIAGNOSIS: FIGOStage I-B, grade 1endometrioid adenocarcinoma, pT1b, pN0   Simple treatment device note: Patient had construction of her custom vaginal cylinder. She will be treated with a 2.5 cm diameter segmented cylinder. This conforms to her anatomy without undue discomfort.  Vaginal brachytherapy procedure node: The patient was brought to the [place] suite. Identity was confirmed. All relevant records and images related to the planned course of therapy were reviewed. The patient freely provided informed written consent to proceed with treatment after reviewing the details related to the planned course of therapy. The consent form was witnessed and verified by the simulation staff. Then, the patient was set-up in a stable reproducible supine position for radiation therapy. Pelvic exam revealed the vaginal cuff to be intact. The patient's custom vaginal cylinder was placed in the proximal vagina. This was affixed to the CT/MR stabilization plate to prevent slippage. Patient tolerated the placement well.  Verification simulation note:  A fiducial marker was placed within the vaginal cylinder. An AP and lateral film was then obtained through the pelvis area. This documented accurate position of the vaginal cylinder for treatment.  HDR BRACHYTHERAPY TREATMENT  The remote afterloading device was affixed to the vaginal cylinder by catheter. Patient then proceeded to undergo her third high-dose-rate treatment directed at the proximal vagina. The patient was prescribed a dose of 6 gray to be delivered to the mucosal surface. Treatment length was 3.5 cm. Patient was treated with 1 channel using 8 dwell positions. Treatment time was 188.1 seconds. Iridium 192 was the high-dose-rate  source for treatment. The patient tolerated the treatment well. After completion of her therapy, a radiation survey was performed documenting return of the iridium source into the GammaMed safe.   PLAN: She will return in one week on 10/21/18 for her fourth high-dose-rate treatment.  ________________________________  Blair Promise, PhD, MD   This document serves as a record of services personally performed by Gery Pray, MD. It was created on his behalf by Mary-Margaret Loma Messing, a trained medical scribe. The creation of this record is based on the scribe's personal observations and the provider's statements to them. This document has been checked and approved by the attending provider.

## 2018-10-20 ENCOUNTER — Telehealth: Payer: Self-pay | Admitting: *Deleted

## 2018-10-20 NOTE — Telephone Encounter (Signed)
Called patient to remind of HDR Tx. for 10-21-18 @ 10 am, spoke with patient and she is aware of this appt.

## 2018-10-21 ENCOUNTER — Ambulatory Visit
Admission: RE | Admit: 2018-10-21 | Discharge: 2018-10-21 | Disposition: A | Discharge status: Home / Self Care | Payer: Medicare Other | Source: Ambulatory Visit | Attending: Radiation Oncology | Admitting: Radiation Oncology

## 2018-10-21 ENCOUNTER — Other Ambulatory Visit: Payer: Self-pay

## 2018-10-21 DIAGNOSIS — C541 Malignant neoplasm of endometrium: Secondary | ICD-10-CM | POA: Diagnosis not present

## 2018-10-21 DIAGNOSIS — Z510 Encounter for antineoplastic radiation therapy: Secondary | ICD-10-CM | POA: Diagnosis not present

## 2018-10-21 NOTE — Progress Notes (Signed)
  Radiation Oncology         [phone] ________________________________  Name: Nicole Brennan MRN: [id number]  Date: 10/21/2018  DOB: 20-Aug-1940  CC: Copland, Gay Filler, MD  Copland, Gay Filler, MD  HDR BRACHYTHERAPY NOTE  DIAGNOSIS: FIGOStage I-B, grade 1endometrioid adenocarcinoma, pT1b, pN0   Simple treatment device note: Patient had construction of her custom vaginal cylinder. She will be treated with a 2.5 cm diameter segmented cylinder. This conforms to her anatomy without undue discomfort.  Vaginal brachytherapy procedure node: The patient was brought to the [place] suite. Identity was confirmed. All relevant records and images related to the planned course of therapy were reviewed. The patient freely provided informed written consent to proceed with treatment after reviewing the details related to the planned course of therapy. The consent form was witnessed and verified by the simulation staff. Then, the patient was set-up in a stable reproducible supine position for radiation therapy. Pelvic exam revealed the vaginal cuff to be intact. The patient's custom vaginal cylinder was placed in the proximal vagina. This was affixed to the CT/MR stabilization plate to prevent slippage. Patient tolerated the placement well.  Verification simulation note:  A fiducial marker was placed within the vaginal cylinder. An AP and lateral film was then obtained through the pelvis area. This documented accurate position of the vaginal cylinder for treatment.  HDR BRACHYTHERAPY TREATMENT  The remote afterloading device was affixed to the vaginal cylinder by catheter. Patient then proceeded to undergo her fourth high-dose-rate treatment directed at the proximal vagina. The patient was prescribed a dose of 6 gray to be delivered to the mucosal surface. Treatment length was 3.5 cm. Patient was treated with 1 channel using 8 dwell positions. Treatment time was 200.90 seconds. Iridium 192 was the high-dose-rate  source for treatment. The patient tolerated the treatment well. After completion of her therapy, a radiation survey was performed documenting return of the iridium source into the GammaMed safe.   PLAN: She will return in one week on 10/28/18 for her final high-dose-rate treatment.  ________________________________  Blair Promise, PhD, MD   This document serves as a record of services personally performed by Gery Pray, MD. It was created on his behalf by Wilburn Mylar, a trained medical scribe. The creation of this record is based on the scribe's personal observations and the provider's statements to them. This document has been checked and approved by the attending provider.

## 2018-10-27 ENCOUNTER — Telehealth: Payer: Self-pay | Admitting: *Deleted

## 2018-10-27 NOTE — Telephone Encounter (Signed)
CALLED PATIENT TO REMIND OF 1 PM HDR TX. FOR 10-28-18, SPOKE WITH PATIENT AND SHE IS AWARE OF THIS [place].

## 2018-10-28 ENCOUNTER — Other Ambulatory Visit: Payer: Self-pay

## 2018-10-28 ENCOUNTER — Ambulatory Visit
Admission: RE | Admit: 2018-10-28 | Discharge: 2018-10-28 | Disposition: A | Discharge status: Home / Self Care | Payer: Medicare Other | Source: Ambulatory Visit | Attending: Radiation Oncology | Admitting: Radiation Oncology

## 2018-10-28 ENCOUNTER — Encounter: Payer: Self-pay | Admitting: Radiation Oncology

## 2018-10-28 DIAGNOSIS — Z510 Encounter for antineoplastic radiation therapy: Secondary | ICD-10-CM | POA: Diagnosis not present

## 2018-10-28 DIAGNOSIS — C541 Malignant neoplasm of endometrium: Secondary | ICD-10-CM

## 2018-10-28 NOTE — Progress Notes (Signed)
  Radiation Oncology         [phone] ________________________________  Name: Nicole Brennan MRN: [id number]  Date: 10/28/2018  DOB: [birth year]  CC: Copland, Gay Filler, MD  Copland, Gay Filler, MD  HDR BRACHYTHERAPY NOTE  DIAGNOSIS: FIGOStage I-B, grade 1endometrioid adenocarcinoma, pT1b, pN0   Simple treatment device note: Patient had construction of her custom vaginal cylinder. She will be treated with a 2.5 cm diameter segmented cylinder. This conforms to her anatomy without undue discomfort.  Vaginal brachytherapy procedure node: The patient was brought to the [place] suite. Identity was confirmed. All relevant records and images related to the planned course of therapy were reviewed. The patient freely provided informed written consent to proceed with treatment after reviewing the details related to the planned course of therapy. The consent form was witnessed and verified by the simulation staff. Then, the patient was set-up in a stable reproducible supine position for radiation therapy. Pelvic exam revealed the vaginal cuff to be intact. The patient's custom vaginal cylinder was placed in the proximal vagina. This was affixed to the CT/MR stabilization plate to prevent slippage. Patient tolerated the placement well.  Verification simulation note:  A fiducial marker was placed within the vaginal cylinder. An AP and lateral film was then obtained through the pelvis area. This documented accurate position of the vaginal cylinder for treatment.  HDR BRACHYTHERAPY TREATMENT  The remote afterloading device was affixed to the vaginal cylinder by catheter. Patient then proceeded to undergo her fifth high-dose-rate treatment directed at the proximal vagina. The patient was prescribed a dose of 6 gray to be delivered to the mucosal surface. Treatment length was 3.5 cm. Patient was treated with 1 channel using 8 dwell positions. Treatment time was 214.5 seconds. Iridium 192 was the high-dose-rate  source for treatment. The patient tolerated the treatment well. After completion of her therapy, a radiation survey was performed documenting return of the iridium source into the GammaMed safe.   PLAN: Patient has completed her high-dose-rate treatment. She will follow up in one month. ________________________________  Blair Promise, PhD, MD   This document serves as a record of services personally performed by Gery Pray, MD. It was created on his behalf by Wilburn Mylar, a trained medical scribe. The creation of this record is based on the scribe's personal observations and the provider's statements to them. This document has been checked and approved by the attending provider.

## 2018-11-05 ENCOUNTER — Ambulatory Visit (INDEPENDENT_AMBULATORY_CARE_PROVIDER_SITE_OTHER): Payer: Medicare Other | Admitting: Family Medicine

## 2018-11-05 ENCOUNTER — Encounter: Payer: Self-pay | Admitting: Family Medicine

## 2018-11-05 ENCOUNTER — Other Ambulatory Visit: Payer: Self-pay

## 2018-11-05 VITALS — BP 110/60 | HR 74 | Temp 97.4°F | Resp 16 | Ht 65.0 in | Wt 185.0 lb

## 2018-11-05 DIAGNOSIS — N898 Other specified noninflammatory disorders of vagina: Secondary | ICD-10-CM | POA: Diagnosis not present

## 2018-11-05 DIAGNOSIS — E038 Other specified hypothyroidism: Secondary | ICD-10-CM

## 2018-11-05 DIAGNOSIS — L255 Unspecified contact dermatitis due to plants, except food: Secondary | ICD-10-CM | POA: Diagnosis not present

## 2018-11-05 MED ORDER — LEVOTHYROXINE SODIUM 75 MCG PO TABS: 75.0000 ug | ORAL_TABLET | Freq: Every day | ORAL | 3 refills | Status: DC

## 2018-11-05 MED ORDER — PREDNISONE 20 MG PO TABS: ORAL_TABLET | ORAL | 0 refills | Status: DC

## 2018-11-05 MED FILL — predniSONE 20 MG TABS: 20 | 10 days supply | Qty: 15 | Fill #0

## 2018-11-05 NOTE — Patient Instructions (Signed)
Good to see you again today!  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  If this is not safe for you, I do have women who use coconut oil as a vaginal lubricant and moisturizer with good success

## 2018-11-05 NOTE — Progress Notes (Signed)
[facility] [address], [place], [place] [address] [phone]  Date:  11/05/2018   Name:  Nicole Brennan   DOB:  [birth year]   MRN:  [id number]  PCP:  Darreld Mclean, MD    Chief Complaint: Poison Ivy (week ago, )   History of Present Illness:  Nicole Brennan is a 78 y.o. very pleasant female patient who presents with the following:  Here today for possible PI rash History of endometrial cancer, hypothyroidism, OA  She was doing some gardening about 1 days ago- she then broke out with PI on her bilateral arms The rash has been present for about 4 days She tried topicals but not helping her  The rash is mostly on her right hand and arm It is quite itchy and bothersome  She finished her radiation a week ago - she did just 5 treatments No chemo Surgery for endometrial cancer completed in June   She notes concern of vaginal dryness, she and her husband are still sexually active so this is an issue.  Lab Results  Component Value Date   TSH 2.18 09/01/2018     Patient Active Problem List   Diagnosis Date Noted  . Endometrial carcinoma ([place]) 08/05/2018  . Obesity (BMI 30.0-34.9) 08/05/2018  . Seasonal allergies 04/21/2012  . Cataract 04/21/2012  . Osteoporosis 05/01/2011  . Hypothyroidism 04/30/2011  . Osteoarthritis 04/30/2011    Past Medical History:  Diagnosis Date  . Allergy   . Arthritis   . Cataract   . Thyroid disease     Past Surgical History:  Procedure Laterality Date  . DG  BONE DENSITY ([place] HX)    . KNEE SURGERY    . ROBOTIC ASSISTED TOTAL HYSTERECTOMY WITH BILATERAL SALPINGO OOPHERECTOMY Bilateral 08/20/2018   Procedure: XI ROBOTIC ASSISTED TOTAL HYSTERECTOMY WITH BILATERAL SALPINGO OOPHORECTOMY;  Surgeon: Everitt Amber, MD;  Location: [facility];  Service: Gynecology;  Laterality: Bilateral;  . SENTINEL NODE BIOPSY N/A 08/20/2018   Procedure: SENTINEL LYMPH  NODE BIOPSY;  Surgeon: Everitt Amber, MD;  Location: [facility];  Service: Gynecology;  Laterality: N/A;  . TUBAL LIGATION      Social History   Tobacco Use  . Smoking status: Never Smoker  . Smokeless tobacco: Never Used  Substance Use Topics  . Alcohol use: No  . Drug use: No    Family History  Problem Relation Age of Onset  . Vascular Disease Mother   . Colon cancer Mother   . Dementia Father     Allergies  Allergen Reactions  . Penicillins Hives    Did it involve swelling of the face/tongue/throat, SOB, or low BP? No Did it involve sudden or severe rash/hives, skin peeling, or any reaction on the inside of your mouth or nose? Unknown Did you need to seek medical attention at a hospital or doctor's office? Yes When did it last happen?Childhood reaction If all above answers are "NO", may proceed with cephalosporin use.     Medication list has been reviewed and updated.  Current Outpatient Medications on File Prior to Visit  Medication Sig Dispense Refill  . acetaminophen (TYLENOL) 500 MG tablet Take 1,000 mg by mouth every 6 (six) hours as needed for headache.     . augmented betamethasone dipropionate (DIPROLENE-AF) 0.05 % cream Apply topically 2 (two) times daily. 60 g 0  . Calcium-Phosphorus-Vitamin D (CALCIUM GUMMIES) [id number] MG-MG-UNIT CHEW Chew 2 tablets by mouth  at bedtime.     . carboxymethylcellul-glycerin (OPTIVE) 0.5-0.9 % ophthalmic solution Place 1 drop into both eyes 2 (two) times daily as needed for dry eyes.    . hydrOXYzine (ATARAX/VISTARIL) 10 MG tablet Take 1 tablet (10 mg total) by mouth 3 (three) times daily as needed for itching. 40 tablet 0  . Multiple Vitamins-Minerals (MULTIVITAMIN WITH MINERALS) tablet Take 1 tablet by mouth every evening. Centrum Silver     No current facility-administered medications on file prior to visit.     Review of Systems:  As per HPI- otherwise negative.  No fever or chills, no chest pain or shortness of breath Physical Examination: Vitals:    11/05/18 1341  BP: 110/60  Pulse: 74  Resp: 16  Temp: (!) 97.4 F (36.3 C)  SpO2: 98%   Vitals:   11/05/18 1341  Weight: 185 lb (83.9 kg)  Height: 5\' 5"  (1.651 m)   Body mass index is 30.79 kg/m. Ideal Body Weight: Weight in (lb) to have BMI = 25: 149.9  GEN: WDWN, NAD, Non-toxic, A & O x 3, well-appearing patient who looks younger than age 75: Atraumatic, Normocephalic. Neck supple. No masses, No LAD. Ears and Nose: No external deformity. CV: RRR, No M/G/R. No JVD. No thrill. No extra heart sounds. PULM: CTA B, no wheezes, crackles, rhonchi. No retractions. No resp. distress. No accessory muscle use. EXTR: No c/c/e NEURO Normal gait.  PSYCH: Normally interactive. Conversant. Not depressed or anxious appearing.  Calm demeanor.  She has significant rhus dermatitis rash on her right hand and arm, some smaller areas on her left arm  Lab Results  Component Value Date   TSH 2.18 09/01/2018    Assessment and Plan: Rhus dermatitis - Plan: predniSONE (DELTASONE) 20 MG tablet  Other specified hypothyroidism - Plan: levothyroxine (SYNTHROID) 75 MCG tablet  Vaginal dryness  Here today with concern of poison ivy rash on her arms after gardening.  She has tried topical products without much relief.  The itching is quite bothersome.  Prescribed prednisone by mouth for 10 days-she will let me know if not helpful Refilled her levothyroxine Discussed management of her vaginal dryness with her-   Good to see you again today!  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  If this is not safe for you, I do have women who use coconut oil as a vaginal lubricant and moisturizer with good success  Signed Lamar Blinks, MD

## 2018-12-01 ENCOUNTER — Other Ambulatory Visit: Payer: Self-pay

## 2018-12-01 ENCOUNTER — Ambulatory Visit (INDEPENDENT_AMBULATORY_CARE_PROVIDER_SITE_OTHER): Payer: Medicare Other | Admitting: *Deleted

## 2018-12-01 DIAGNOSIS — Z23 Encounter for immunization: Secondary | ICD-10-CM

## 2018-12-01 NOTE — Progress Notes (Signed)
Patient here today for flu vaccine.  Vaccine given and patient tolerated well. 

## 2018-12-02 ENCOUNTER — Telehealth: Payer: Self-pay | Admitting: *Deleted

## 2018-12-02 NOTE — Telephone Encounter (Signed)
Called patient to ask about coming in tomorrow @ 2:30 pm, lvm for a return call

## 2018-12-03 ENCOUNTER — Encounter: Payer: Self-pay | Admitting: Radiation Oncology

## 2018-12-03 ENCOUNTER — Ambulatory Visit
Admission: RE | Admit: 2018-12-03 | Discharge: 2018-12-03 | Disposition: A | Discharge status: Home / Self Care | Payer: Medicare Other | Source: Ambulatory Visit | Attending: Radiation Oncology | Admitting: Radiation Oncology

## 2018-12-03 ENCOUNTER — Other Ambulatory Visit: Payer: Self-pay

## 2018-12-03 VITALS — BP 141/77 | HR 86 | Temp 98.7°F | Resp 20 | Ht 66.0 in | Wt 187.4 lb

## 2018-12-03 DIAGNOSIS — Z08 Encounter for follow-up examination after completed treatment for malignant neoplasm: Secondary | ICD-10-CM | POA: Diagnosis not present

## 2018-12-03 DIAGNOSIS — C541 Malignant neoplasm of endometrium: Secondary | ICD-10-CM | POA: Insufficient documentation

## 2018-12-03 DIAGNOSIS — Z923 Personal history of irradiation: Secondary | ICD-10-CM | POA: Diagnosis not present

## 2018-12-03 DIAGNOSIS — R5383 Other fatigue: Secondary | ICD-10-CM | POA: Diagnosis not present

## 2018-12-03 NOTE — Progress Notes (Incomplete)
°  Radiation Oncology         [phone] ________________________________  Name: Nicole Brennan MRN: [id number]  Date: 10/28/2018  DOB: April 26, 1940  End of Treatment Note  Diagnosis:   FIGOStage I-B, grade 1endometrioid adenocarcinoma, pT1b, pN0     Indication for treatment:  Curative       Radiation treatment dates:   7/22, 7/29, 8/5, 8/12, 10/28/2018  Site/dose:   Vaginal Cuff, 2.5 cm cylinder, 3.5 cm tx length / 30 Gy in 5 fractions  Beams/energy:   Brachytherapy, HDR / Iridium-192  Narrative: The patient tolerated radiation treatment relatively well. She was not noted to have any complaints during treatment.  Plan: The patient has completed radiation treatment. The patient will return to radiation oncology clinic for routine followup in one month. I advised them to call or return sooner if they have any questions or concerns related to their recovery or treatment.  -----------------------------------  Blair Promise, PhD, MD  This document serves as a record of services personally performed by Gery Pray, MD. It was created on his behalf by Wilburn Mylar, a trained medical scribe. The creation of this record is based on the scribe's personal observations and the provider's statements to them. This document has been checked and approved by the attending provider.

## 2018-12-03 NOTE — Progress Notes (Signed)
  Radiation Oncology         [phone] ________________________________  Name: Nicole Brennan MRN: [id number]  Date: 12/03/2018  DOB: [birth year]  Follow-Up Visit Note  CC: Copland, Gay Filler, MD  Copland, Gay Filler, MD    ICD-10-CM   1. Endometrial carcinoma ([facility])  C54.1     Diagnosis:   FIGOStage I-B, grade 1endometrioid adenocarcinoma, pT1b, pN0  Interval Since Last Radiation:  5 weeks   7/22, 7/29, 8/5, 8/12, 10/28/2018: Vaginal Cuff (Brachytherapy) / 30 Gy in 5 fractions  Narrative:  The patient returns today for routine follow-up.    On review of systems, she reports some mild lingering fatigue. She also reports an occasional "full feeling." She denies dysuria or hematuria, vaginal discharge or bleeding, rectal bleeding, diarrhea or constipation, and nausea or vomiting.  ALLERGIES:  is allergic to penicillins.  Meds: Current Outpatient Medications  Medication Sig Dispense Refill  . acetaminophen (TYLENOL) 500 MG tablet Take 1,000 mg by mouth every 6 (six) hours as needed for headache.     . Calcium-Phosphorus-Vitamin D (CALCIUM GUMMIES) [id number] MG-MG-UNIT CHEW Chew 2 tablets by mouth at bedtime.     . carboxymethylcellul-glycerin (OPTIVE) 0.5-0.9 % ophthalmic solution Place 1 drop into both eyes 2 (two) times daily as needed for dry eyes.    . hydrOXYzine (ATARAX/VISTARIL) 10 MG tablet Take 1 tablet (10 mg total) by mouth 3 (three) times daily as needed for itching. 40 tablet 0  . levothyroxine (SYNTHROID) 75 MCG tablet Take 1 tablet (75 mcg total) by mouth daily. 90 tablet 3  . Multiple Vitamins-Minerals (MULTIVITAMIN WITH MINERALS) tablet Take 1 tablet by mouth every evening. Centrum Silver     No current facility-administered medications for this encounter.     Physical Findings: The patient is in no acute distress. Patient is alert and oriented.  height is 5\' 6"  (1.676 m) and weight is 187 lb 6.4 oz (85 kg). Her temporal temperature is 98.7 F (37.1 C). Her  blood pressure is 141/77 (abnormal) and her pulse is 86. Her respiration is 20 and oxygen saturation is 99%. .  No significant changes. Lungs are clear to auscultation bilaterally. Heart has regular rate and rhythm. No palpable cervical, supraclavicular, or axillary adenopathy. Abdomen soft, non-tender, normal bowel sounds. Pelvic exam deferred in light of recent treatment completion.  Lab Findings: Lab Results  Component Value Date   WBC 6.6 08/11/2018   HGB 13.5 08/11/2018   HCT 42.9 08/11/2018   MCV 100.2 (H) 08/11/2018   PLT 374 08/11/2018    Radiographic Findings: No results found.  Impression:  The patient is recovering from the effects of radiation.  Minimal fatigue at this time.  Patient was given a vaginal dilator and instructions on its use.  Plan: Patient will follow up with Dr. Denman George in 2 months.  She will  be seen in radiation oncology in 5 months.  ____________________________________ Gery Pray, MD   This document serves as a record of services personally performed by Gery Pray, MD. It was created on his behalf by Wilburn Mylar, a trained medical scribe. The creation of this record is based on the scribe's personal observations and the provider's statements to them. This document has been checked and approved by the attending provider.

## 2018-12-03 NOTE — Progress Notes (Addendum)
Pt presents today for f/u with Dr. Sondra Come. Vaginal dilator education given with good understanding. Pt reports some lingering, mild fatigue. Pt denies dysuria/hematuria. Pt denies vaginal bleeding/discharge. Pt denies rectal bleeding, diarrhea/constipation. Pt reports occasional "full feeling". Pt denies N/V.  BP (!) 141/77 (BP Location: Right Arm)   Pulse 86   Temp 98.7 F (37.1 C) (Temporal)   Resp 20   Ht 5\' 6"  (1.676 m)   Wt 187 lb 6.4 oz (85 kg)   SpO2 99%   BMI 30.25 kg/m   Wt Readings from Last 3 Encounters:  12/03/18 187 lb 6.4 oz (85 kg)  11/05/18 185 lb (83.9 kg)  09/30/18 185 lb 6 oz (84.1 kg)   Loma Sousa, RN BSN     Home Care Instructions for the Insertion and Care of Your Vaginal Dilator  Why Do I Need a Vaginal Dilator?  Internal radiation therapy may cause scar tissue to form at the top of your vagina (vaginal cuff).  This may make vaginal examinations difficult in the future. You can prevent scar tissue from forming by using a vaginal dilator (a smooth plastic rod), and/or by having regular sexual intercourse.  If not using the dilator you should be having intercourse two or three times a week.  If you are unable to have intercourse, you should use your vaginal dilator.  You may have some spotting or bleeding from your dilator or intercourse the first few times. You may also have some discomfort. If discomfort occurs with intercourse, you and your partner may need to stop for a while and try again later.  How to Use Your Vaginal Dilator  - Wash the dilator with soap and water before and after each use. - Check the dilator to be sure it is smooth. Do not use the dilator if you find any roughspots. - Coat the dilator with K-Y Jelly, Astroglide, or Replens. Do not use Vaseline, baby oil, or other oil based lubricants. They are not water-soluble and can be irritating to the tissues in the vagina. - Lie on your back with your knees bent and legs  apart. - Insert the rounded end of the dilator into your vagina as far as it will go without causing pain or discomfort. - Close your knees and slowly straighten your legs. - Keep the dilator in your vagina for about 10 to 15 minutes.  Please use 3 times a week, for example: Monday, Wednesday and Friday evenings. [facility] your knees, open your legs, and gently remove the dilator. - Gently cleanse the skin around the vaginal opening. - Wash the dilator after each use. -  It is important that you use the dilator routinely until instructed otherwise by your doctor.   Loma Sousa, RN BSN

## 2018-12-03 NOTE — Patient Instructions (Signed)
Home Care Instructions for the Insertion and Care of Your Vaginal Dilator  Why Do I Need a Vaginal Dilator?  Internal radiation therapy may cause scar tissue to form at the top of your vagina (vaginal cuff).  This may make vaginal examinations difficult in the future. You can prevent scar tissue from forming by using a vaginal dilator (a smooth plastic rod), and/or by having regular sexual intercourse.  If not using the dilator you should be having intercourse two or three times a week.  If you are unable to have intercourse, you should use your vaginal dilator.  You may have some spotting or bleeding from your dilator or intercourse the first few times. You may also have some discomfort. If discomfort occurs with intercourse, you and your partner may need to stop for a while and try again later.  How to Use Your Vaginal Dilator  - Wash the dilator with soap and water before and after each use. - Check the dilator to be sure it is smooth. Do not use the dilator if you find any roughspots. - Coat the dilator with K-Y Jelly, Astroglide, or Replens. Do not use Vaseline, baby oil, or other oil based lubricants. They are not water-soluble and can be irritating to the tissues in the vagina. - Lie on your back with your knees bent and legs apart. - Insert the rounded end of the dilator into your vagina as far as it will go without causing pain or discomfort. - Close your knees and slowly straighten your legs. - Keep the dilator in your vagina for about 10 to 15 minutes.  Please use 3 times a week, for example: Monday, Wednesday and Friday evenings. - Bend your knees, open your legs, and gently remove the dilator. - Gently cleanse the skin around the vaginal opening. - Wash the dilator after each use. -  It is important that you use the dilator routinely until instructed otherwise by your doctor.   Coronavirus (COVID-19) Are you at risk?  Are you at risk for the Coronavirus  (COVID-19)?  To be considered HIGH RISK for Coronavirus (COVID-19), you have to meet the following criteria:  . Traveled to [place], [place], [place], [place] or [place]; or in the [place] to [place], [place], [place], or [place]; and have fever, cough, and shortness of breath within the last 2 weeks of travel OR . Been in close contact with a person diagnosed with COVID-19 within the last 2 weeks and have fever, cough, and shortness of breath . IF YOU DO NOT MEET THESE CRITERIA, YOU ARE CONSIDERED LOW RISK FOR COVID-19.  What to do if you are HIGH RISK for COVID-19?  . If you are having a medical emergency, call 911. . Seek medical care right away. Before you go to a doctor's office, urgent care or emergency department, call ahead and tell them about your recent travel, contact with someone diagnosed with COVID-19, and your symptoms. You should receive instructions from your physician's office regarding next steps of care.  . When you arrive at healthcare provider, tell the healthcare staff immediately you have returned from visiting [place], [place], [place], [place] or [place]; or traveled in the [place] to [place], [place], [place], or [place]; in the last two weeks or you have been in close contact with a person diagnosed with COVID-19 in the last 2 weeks.   . Tell the health care staff about your symptoms: fever, cough and shortness of breath. .   After you have been seen by a medical provider, you will be either: o Tested for (COVID-19) and discharged home on quarantine except to seek medical care if symptoms worsen, and asked to  - Stay home and avoid contact with others until you get your results (4-5 days)  - Avoid travel on public transportation if possible (such as bus, train, or airplane) or o Sent to the Emergency Department by EMS for evaluation, COVID-19 testing, and possible admission depending on your condition and test results.  What to do if you are LOW  RISK for COVID-19?  Reduce your risk of any infection by using the same precautions used for avoiding the common cold or flu:  . Wash your hands often with soap and warm water for at least 20 seconds.  If soap and water are not readily available, use an alcohol-based hand sanitizer with at least 60% alcohol.  . If coughing or sneezing, cover your mouth and nose by coughing or sneezing into the elbow areas of your shirt or coat, into a tissue or into your sleeve (not your hands). . Avoid shaking hands with others and consider head nods or verbal greetings only. . Avoid touching your eyes, nose, or mouth with unwashed hands.  . Avoid close contact with people who are sick. . Avoid places or events with large numbers of people in one location, like concerts or sporting events. . Carefully consider travel plans you have or are making. . If you are planning any travel outside or inside the [place], visit the CDC's Travelers' Health webpage for the latest health notices. . If you have some symptoms but not all symptoms, continue to monitor at home and seek medical attention if your symptoms worsen. . If you are having a medical emergency, call 911.   ADDITIONAL HEALTHCARE OPTIONS FOR PATIENTS   Telehealth / e-Visit: [email or web address]         [facility]: [phone]  [facility]: [phone]                   [facility]: [phone]   

## 2019-01-05 ENCOUNTER — Ambulatory Visit: Payer: Self-pay | Admitting: *Deleted

## 2019-01-05 ENCOUNTER — Encounter: Payer: Self-pay | Admitting: Family Medicine

## 2019-01-05 NOTE — Telephone Encounter (Signed)
Please advise 

## 2019-01-05 NOTE — Telephone Encounter (Signed)
Contacted pt regarding her desire to discuss the benefits of taking zinc;  the pt would like to know if this is something that would benefit her with all the meds that she is on;the pt said that she cancer surgery June 2020, she  would like to know if this would a good prevention for the winter months with flu and COVID; the pt says that she spoke with someone at the pharmacy, and was told it could hurt her kidneys; she would like to discuss this with Dr Lorelei Pont, [facility]; the pt can be contacted at [phone]; will route to office for final disposition  Reason for Disposition . [1] Caller requesting NON-URGENT health information AND [2] PCP's office is the best resource  Answer Assessment - Initial Assessment Questions 1. REASON FOR CALL or QUESTION: "What is your reason for calling today?" or "How can I best help you?" or "What question do you have that I can help answer?"     Pt would like to know the benefits of taking zinc  Protocols used: INFORMATION ONLY CALL - NO TRIAGE-A-AH

## 2019-01-27 ENCOUNTER — Other Ambulatory Visit: Payer: Self-pay

## 2019-01-27 ENCOUNTER — Encounter: Payer: Self-pay | Admitting: Gynecologic Oncology

## 2019-01-27 ENCOUNTER — Inpatient Hospital Stay: Payer: Medicare Other | Attending: Gynecologic Oncology | Admitting: Gynecologic Oncology

## 2019-01-27 VITALS — BP 128/71 | HR 71 | Temp 97.8°F | Resp 18 | Ht 66.0 in | Wt 187.5 lb

## 2019-01-27 DIAGNOSIS — Z923 Personal history of irradiation: Secondary | ICD-10-CM | POA: Insufficient documentation

## 2019-01-27 DIAGNOSIS — C541 Malignant neoplasm of endometrium: Secondary | ICD-10-CM | POA: Insufficient documentation

## 2019-01-27 DIAGNOSIS — Z6831 Body mass index (BMI) 31.0-31.9, adult: Secondary | ICD-10-CM | POA: Diagnosis not present

## 2019-01-27 DIAGNOSIS — Z90722 Acquired absence of ovaries, bilateral: Secondary | ICD-10-CM

## 2019-01-27 DIAGNOSIS — E039 Hypothyroidism, unspecified: Secondary | ICD-10-CM | POA: Insufficient documentation

## 2019-01-27 DIAGNOSIS — Z79899 Other long term (current) drug therapy: Secondary | ICD-10-CM | POA: Insufficient documentation

## 2019-01-27 DIAGNOSIS — E669 Obesity, unspecified: Secondary | ICD-10-CM | POA: Insufficient documentation

## 2019-01-27 DIAGNOSIS — Z90710 Acquired absence of both cervix and uterus: Secondary | ICD-10-CM | POA: Insufficient documentation

## 2019-01-27 DIAGNOSIS — N941 Unspecified dyspareunia: Secondary | ICD-10-CM | POA: Diagnosis not present

## 2019-01-27 DIAGNOSIS — N898 Other specified noninflammatory disorders of vagina: Secondary | ICD-10-CM

## 2019-01-27 MED ORDER — ASTROGLIDE EX GEL: 1.0000 | CUTANEOUS | 3 refills | Status: DC | PRN

## 2019-01-27 MED FILL — ASTROGLIDE GEL: 30 days supply | Qty: 67 | Fill #0

## 2019-01-27 NOTE — Patient Instructions (Signed)
Dr Denman George has prescribed astroglyde ex for you to use during intercourse.  It is also reasonable to try coconut oil as a lubricant.  Please notify Dr Denman George at phone number [phone] if you notice vaginal bleeding, new pelvic or abdominal pains, bloating, feeling full easy, or a change in bladder or bowel function.   Please return to see Dr Denman George in May, 2021.

## 2019-01-27 NOTE — Progress Notes (Signed)
[name]  Chief Complaint:  Chief Complaint  Patient presents with  . 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 S/p adjuvant radiation (vaginal brachytherapy - completed 10/28/18.  No evidence of disease.   Vaginal dryness and dyspareunia - prescribed astroglyde   Assessment/Plan:  Nicole Brennan  is a 78 y.o.  year old with stage IB grade 1 endometrial cancer.  High/intermediate risk factors for recurrence. Recommendation is for vaginal brachytherapy to reduce risk for local recurrence in accordance with NCCN guidelines.  I discussed this with the patient. I discussed the role of adjuvant therapy. I discussed prognosis and risk for recurrence. We reviewed symptoms concerning for recurrence and she will see me if these develop prior to her scheduled appointment.  After completing adjuvant therapy I recommend she follow-up at 3 monthly intervals for symptom review, physical examination and pelvic examination. Pap smear is not recommended in routine endometrial cancer surveillance. After 2 years we will space these visits to every 6 months, and then annually if recurrence has not developed within 5 years. All questions were answered.   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 The patient's history began in May 2020 when she began experiencing vaginal spotting and was seen by Dr. Murrell Redden performed a transvaginal ultrasound scan which showed a thickened endometrium which then prompted an endometrial biopsy which was performed on Jul 27, 2018.  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  The patient is an otherwise fairly healthy woman.  She is obese by BMI definition with a BMI of 31 kg/m.  She has hypothyroidism.  Her only prior  abdominal surgery is a tubal ligation.  Family history is remarkable for mother with colon cancer.  Patient is nulliparous.  She has no history of exogenous hormone use.  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  On August 20, 2018 she underwent a robotic assisted total hysterectomy, BSO sentinel lymph node biopsy.  Intraoperative findings were unremarkable for extrauterine disease.  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  Sentinel lymph nodes were negative for metastatic disease.  The cervix, and adnexa are also negative for metastatic disease.  There was no lymphovascular or perineural invasion seen.  Due to her deep myometrial invasion and at her age she met criteria for intermediate to high risk for recurrence.  She was recommended adjuvant radiation to the vaginal cuff in accordance with NCCN guidelines.  Interval Hx:  She received vaginal cuff brachytherapy (30 Pearline Cables in 5 fractions) between the dates of July 22 and October 28, 2018.  She tolerated treatment well.  She returns today for follow-up and reported dyspareunia and vaginal dryness using over-the-counter lubricants.  Current Meds:  Outpatient Encounter Medications as of 01/27/2019  Medication Sig  . acetaminophen (TYLENOL) 500 MG tablet Take 1,000 mg by mouth every 6 (six) hours as needed for headache.   . Calcium-Phosphorus-Vitamin D (CALCIUM GUMMIES) [phone] MG-MG-UNIT CHEW Chew 2 tablets by mouth at bedtime.   . carboxymethylcellul-glycerin (OPTIVE) 0.5-0.9 % ophthalmic solution Place 1 drop into both eyes 2 (two) times daily as needed for dry eyes.  Marland Kitchen levothyroxine (SYNTHROID) 75 MCG tablet Take 1 tablet (  75 mcg total) by mouth daily.  . Multiple Vitamins-Minerals (MULTIVITAMIN WITH MINERALS) tablet Take 1 tablet by mouth every evening. Centrum Silver  .  Lubricants (ASTROGLIDE) GEL Apply 1 applicator topically as needed.  . [DISCONTINUED] hydrOXYzine (ATARAX/VISTARIL) 10 MG tablet Take 1 tablet (10 mg total) by mouth 3 (three) times daily as needed for itching.   No facility-administered encounter medications on file as of 01/27/2019.     Allergy:  Allergies  Allergen Reactions  . Penicillins Hives    Did it involve swelling of the face/tongue/throat, SOB, or low BP? No Did it involve sudden or severe rash/hives, skin peeling, or any reaction on the inside of your mouth or nose? Unknown Did you need to seek medical attention at a hospital or doctor's office? Yes When did it last happen?Childhood reaction If all above answers are "NO", may proceed with cephalosporin use.     Social Hx:   Social History   Socioeconomic History  . Marital status: Married    Spouse name: Not on file  . Number of children: Not on file  . Years of education: Not on file  . Highest education level: Not on file  Occupational History  . Not on file  Social Needs  . Financial resource strain: Not on file  . Food insecurity    Worry: Not on file    Inability: Not on file  . Transportation needs    Medical: Not on file    Non-medical: Not on file  Tobacco Use  . Smoking status: Never Smoker  . Smokeless tobacco: Never Used  Substance and Sexual Activity  . Alcohol use: No  . Drug use: No  . Sexual activity: Yes  Lifestyle  . Physical activity    Days per week: Not on file    Minutes per session: Not on file  . Stress: Not on file  Relationships  . Social Herbalist on phone: Not on file    Gets together: Not on file    Attends religious service: Not on file    Active member of club or organization: Not on file    Attends meetings of clubs or organizations: Not on file    Relationship status: Not on file  . Intimate partner violence    Fear of current or ex partner: Not on file    Emotionally abused: Not on file    Physically  abused: Not on file    Forced sexual activity: Not on file  Other Topics Concern  . Not on file  Social History Narrative  . Not on file    Past Surgical Hx:  Past Surgical History:  Procedure Laterality Date  . DG  BONE DENSITY ([place] [facility])    . KNEE SURGERY    . ROBOTIC ASSISTED TOTAL HYSTERECTOMY WITH BILATERAL SALPINGO OOPHERECTOMY Bilateral 08/20/2018   Procedure: XI ROBOTIC ASSISTED TOTAL HYSTERECTOMY WITH BILATERAL SALPINGO OOPHORECTOMY;  Surgeon: Everitt Amber, MD;  Location: [facility];  Service: Gynecology;  Laterality: Bilateral;  . SENTINEL NODE BIOPSY N/A 08/20/2018   Procedure: SENTINEL LYMPH  NODE BIOPSY;  Surgeon: Everitt Amber, MD;  Location: [facility];  Service: Gynecology;  Laterality: N/A;  . TUBAL LIGATION      Past Medical Hx:  Past Medical History:  Diagnosis Date  . Allergy   . Arthritis   . Cataract   . Thyroid disease     Past Gynecological History:  See HPI No LMP recorded. Patient has had a hysterectomy.  Family Hx:  Family History  Problem Relation Age of Onset  . Vascular Disease Mother   . Colon cancer Mother   . Dementia Father     Review of Systems:  Constitutional  Feels well,    ENT Normal appearing ears and nares bilaterally Skin/Breast  No rash, sores, jaundice, itching, dryness Cardiovascular  No chest pain, shortness of breath, or edema  Pulmonary  No cough or wheeze.  Gastro Intestinal  No nausea, vomitting, or diarrhoea. No bright red blood per rectum, no abdominal pain, change in bowel movement, or constipation.  Genito Urinary  No frequency, urgency, dysuria,no bleeding Musculo Skeletal  No myalgia, arthralgia, joint swelling or pain  Neurologic  No weakness, numbness, change in gait,  Psychology  No depression, anxiety, insomnia.   Vitals:  Blood pressure 128/71, pulse 71, temperature 97.8 F (36.6 C), temperature source Temporal, resp. rate 18, height 5' 6" (1.676 m), weight 187 lb 8 oz (85 kg), SpO2 100 %.  Physical  Exam: WD in NAD Neck  Supple NROM, without any enlargements.  Lymph Node Survey No cervical supraclavicular or inguinal adenopathy Cardiovascular  Pulse normal rate, regularity and rhythm. S1 and S2 normal.  Lungs  Clear to auscultation bilateraly, without wheezes/crackles/rhonchi. Good air movement.  Skin  No rash/lesions/breakdown  Psychiatry  Alert and oriented to person, place, and time  Abdomen  Normoactive bowel sounds, abdomen soft, non-tender and mildly obese without evidence of hernia.  Back No CVA tenderness Genito Urinary  Vulva/vagina: Normal external female genitalia.   No lesions. No discharge or bleeding.  Bladder/urethra:  No lesions or masses, well supported bladder  Vaginal cuff: smooth, in tact, no lesions or masses  Cervix and uterus: surgically absent  Adnexa: no palpable masses. Rectal  deferred Extremities  No bilateral cyanosis, clubbing or edema.   Thereasa Solo, MD  01/27/2019, 1:43 PM

## 2019-02-03 ENCOUNTER — Other Ambulatory Visit: Payer: Self-pay

## 2019-03-23 ENCOUNTER — Telehealth: Payer: Self-pay | Admitting: *Deleted

## 2019-03-23 NOTE — Telephone Encounter (Signed)
Patient called and confirmed her appt for 5/25

## 2019-03-25 ENCOUNTER — Telehealth: Payer: Self-pay | Admitting: Family Medicine

## 2019-03-25 NOTE — Telephone Encounter (Signed)
Copied from [place] [phone] [id number]. Topic: General - Other >> Mar 25, 2019  8:59 AM Keene Breath wrote: Reason for CRM: Patient would like to speak with their PCP regarding the COVID vaccine.  Please call to discuss at [phone]

## 2019-03-25 NOTE — Telephone Encounter (Signed)
Sent patient mychart message with updates on vaccine.

## 2019-03-27 ENCOUNTER — Ambulatory Visit: Payer: Medicare Other | Attending: Internal Medicine

## 2019-03-27 DIAGNOSIS — Z23 Encounter for immunization: Secondary | ICD-10-CM | POA: Insufficient documentation

## 2019-03-27 NOTE — Progress Notes (Signed)
   Covid-19 Vaccination Clinic  Name:  SERAYAH KAUK    MRN: [id number] DOB: [birth year]  03/27/2019  Ms. Hemmerich was observed post Covid-19 immunization for 15 minutes without incidence. She was provided with Vaccine Information Sheet and instruction to access the V-Safe system.   Ms. Malcolm was instructed to call 911 with any severe reactions post vaccine: Marland Kitchen Difficulty breathing  . Swelling of your face and throat  . A fast heartbeat  . A bad rash all over your body  . Dizziness and weakness    Immunizations Administered    Name Date Dose VIS Date Route   Pfizer COVID-19 Vaccine 03/27/2019 11:11 AM 0.3 mL 02/19/2019 Intramuscular   Manufacturer: [employer]   Lot: [id number]   [place]: [id number]

## 2019-04-17 ENCOUNTER — Ambulatory Visit: Payer: Medicare Other | Attending: Internal Medicine

## 2019-04-17 DIAGNOSIS — Z23 Encounter for immunization: Secondary | ICD-10-CM | POA: Insufficient documentation

## 2019-04-17 NOTE — Progress Notes (Signed)
   Covid-19 Vaccination Clinic  Name:  Nicole Brennan    MRN: [id number] DOB: 1941-01-11  04/17/2019  Nicole Brennan was observed post Covid-19 immunization for 15 minutes without incidence. She was provided with Vaccine Information Sheet and instruction to access the V-Safe system.   Nicole Brennan was instructed to call 911 with any severe reactions post vaccine: Marland Kitchen Difficulty breathing  . Swelling of your face and throat  . A fast heartbeat  . A bad rash all over your body  . Dizziness and weakness    Immunizations Administered    Name Date Dose VIS Date Route   Pfizer COVID-19 Vaccine 04/17/2019 10:26 AM 0.3 mL 02/19/2019 Intramuscular   Manufacturer: [place]   Lot: [id number]   [place]: [id number]

## 2019-04-26 ENCOUNTER — Telehealth: Payer: Self-pay | Admitting: *Deleted

## 2019-04-26 NOTE — Telephone Encounter (Signed)
RETURNED PATIENT'S PHONE CALL, SPOKE WITH PATIENT. ?

## 2019-05-03 ENCOUNTER — Ambulatory Visit: Payer: Medicare Other | Admitting: Radiation Oncology

## 2019-05-10 ENCOUNTER — Ambulatory Visit
Admission: RE | Admit: 2019-05-10 | Discharge: 2019-05-10 | Disposition: A | Discharge status: Home / Self Care | Payer: Medicare Other | Source: Ambulatory Visit | Attending: Radiation Oncology | Admitting: Radiation Oncology

## 2019-05-10 ENCOUNTER — Encounter: Payer: Self-pay | Admitting: Radiation Oncology

## 2019-05-10 ENCOUNTER — Other Ambulatory Visit: Payer: Self-pay

## 2019-05-10 VITALS — BP 132/59 | HR 67 | Temp 98.5°F | Resp 20 | Wt 191.0 lb

## 2019-05-10 DIAGNOSIS — C541 Malignant neoplasm of endometrium: Secondary | ICD-10-CM

## 2019-05-10 DIAGNOSIS — N941 Unspecified dyspareunia: Secondary | ICD-10-CM | POA: Diagnosis not present

## 2019-05-10 DIAGNOSIS — Z08 Encounter for follow-up examination after completed treatment for malignant neoplasm: Secondary | ICD-10-CM | POA: Diagnosis not present

## 2019-05-10 DIAGNOSIS — Z8542 Personal history of malignant neoplasm of other parts of uterus: Secondary | ICD-10-CM | POA: Insufficient documentation

## 2019-05-10 DIAGNOSIS — Z79899 Other long term (current) drug therapy: Secondary | ICD-10-CM | POA: Diagnosis not present

## 2019-05-10 NOTE — Patient Instructions (Signed)
Coronavirus (COVID-19) Are you at risk?  Are you at risk for the Coronavirus (COVID-19)?  To be considered HIGH RISK for Coronavirus (COVID-19), you have to meet the following criteria:  . Traveled to [place], [place], [place], [place] or [place]; or in the [place] to [place], [place], [place], or [place]; and have fever, cough, and shortness of breath within the last 2 weeks of travel OR . Been in close contact with a person diagnosed with COVID-19 within the last 2 weeks and have fever, cough, and shortness of breath . IF YOU DO NOT MEET THESE CRITERIA, YOU ARE CONSIDERED LOW RISK FOR COVID-19.  What to do if you are HIGH RISK for COVID-19?  . If you are having a medical emergency, call 911. . Seek medical care right away. Before you go to a doctor's office, urgent care or emergency department, call ahead and tell them about your recent travel, contact with someone diagnosed with COVID-19, and your symptoms. You should receive instructions from your physician's office regarding next steps of care.  . When you arrive at healthcare provider, tell the healthcare staff immediately you have returned from visiting [place], [place], [place], [place] or [place]; or traveled in the [place] to [place], [place], [place], or [place]; in the last two weeks or you have been in close contact with a person diagnosed with COVID-19 in the last 2 weeks.   . Tell the health care staff about your symptoms: fever, cough and shortness of breath. . After you have been seen by a medical provider, you will be either: o Tested for (COVID-19) and discharged home on quarantine except to seek medical care if symptoms worsen, and asked to  - Stay home and avoid contact with others until you get your results (4-5 days)  - Avoid travel on public transportation if possible (such as bus, train, or airplane) or o Sent to the Emergency Department by EMS for evaluation, COVID-19 testing, and possible  admission depending on your condition and test results.  What to do if you are LOW RISK for COVID-19?  Reduce your risk of any infection by using the same precautions used for avoiding the common cold or flu:  . Wash your hands often with soap and warm water for at least 20 seconds.  If soap and water are not readily available, use an alcohol-based hand sanitizer with at least 60% alcohol.  . If coughing or sneezing, cover your mouth and nose by coughing or sneezing into the elbow areas of your shirt or coat, into a tissue or into your sleeve (not your hands). . Avoid shaking hands with others and consider head nods or verbal greetings only. . Avoid touching your eyes, nose, or mouth with unwashed hands.  . Avoid close contact with people who are sick. . Avoid places or events with large numbers of people in one location, like concerts or sporting events. . Carefully consider travel plans you have or are making. . If you are planning any travel outside or inside the [place], visit the CDC's Travelers' Health webpage for the latest health notices. . If you have some symptoms but not all symptoms, continue to monitor at home and seek medical attention if your symptoms worsen. . If you are having a medical emergency, call 911.   ADDITIONAL HEALTHCARE OPTIONS FOR PATIENTS  [place] Telehealth / e-Visit: [email or web address]         [facility]: [phone]  [facility]: [phone]                   [facility]: [phone]   

## 2019-05-10 NOTE — Progress Notes (Signed)
Radiation Oncology         [phone] ________________________________  Name: Nicole Brennan MRN: [id number]  Date: 05/10/2019  DOB: September 20, 1940  Follow-Up Visit Note  CC: Copland, Gay Filler, MD  Copland, Gay Filler, MD    ICD-10-CM   1. Endometrial carcinoma (HCC)  C54.1     Diagnosis:  FIGOStage I-B, grade 1endometrioid adenocarcinoma, pT1b, pN0  Interval Since Last Radiation: Six months, one week, and three days.  09/30/2018, 10/07/2018, 10/14/2018, 10/21/2018, 10/28/2018: Vaginal Cuff (Brachytherapy) / 30 Gy in 5 fractions  Narrative:  The patient returns today for routine follow-up. She is doing well overall. Since her last visit, she followed up with Dr. Denman George on 01/27/2019. During that time, she reported dyspareunia and vaginal dryness, for which she was prescribed Astroglyde. No clinical evidence of recurrence at that time.  On review of systems, she reports having vaginal intercourse with her husband and that she has been using her vaginal dilator as directed. She denies pelvic pain, dysuria, hematuria, vaginal bleeding/discharge, rectal bleeding, diarrhea, constipation, abdominal bloating, nausea, and vomiting.  She reports intercourse is still uncomfortable for her.  ALLERGIES:  is allergic to penicillins.  Meds: Current Outpatient Medications  Medication Sig Dispense Refill  . acetaminophen (TYLENOL) 500 MG tablet Take 1,000 mg by mouth every 6 (six) hours as needed for headache.     . Calcium-Phosphorus-Vitamin D (CALCIUM GUMMIES) [id number] MG-MG-UNIT CHEW Chew 2 tablets by mouth at bedtime.     . carboxymethylcellul-glycerin (OPTIVE) 0.5-0.9 % ophthalmic solution Place 1 drop into both eyes 2 (two) times daily as needed for dry eyes.    Marland Kitchen levothyroxine (SYNTHROID) 75 MCG tablet Take 1 tablet (75 mcg total) by mouth daily. 90 tablet 3  . Lubricants (ASTROGLIDE) GEL Apply 1 applicator topically as needed. 66.5 g 3  . Multiple Vitamins-Minerals (MULTIVITAMIN WITH  MINERALS) tablet Take 1 tablet by mouth every evening. Centrum Silver     No current facility-administered medications for this encounter.    Physical Findings: The patient is in no acute distress. Patient is alert and oriented.  weight is 191 lb (86.6 kg). Her temperature is 98.5 F (36.9 C). Her blood pressure is 132/59 (abnormal) and her pulse is 67. Her respiration is 20 and oxygen saturation is 97%. .  No significant changes. Lungs are clear to auscultation bilaterally. Heart has regular rate and rhythm. No palpable cervical, supraclavicular, or axillary adenopathy. Abdomen soft, non-tender, normal bowel sounds. On pelvic examination the external genitalia were unremarkable. A speculum exam was performed. There are no mucosal lesions noted in the vaginal vault. On bimanual  examination there were no pelvic masses appreciated.  Vaginal cuff intact  Lab Findings: Lab Results  Component Value Date   WBC 6.6 08/11/2018   HGB 13.5 08/11/2018   HCT 42.9 08/11/2018   MCV 100.2 (H) 08/11/2018   PLT 374 08/11/2018    Radiographic Findings: No results found.  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   Plan: Patient will follow up with Dr. Denman George on 08/03/2019 and radiation oncology 10/25/2019.  ____________________________________   Blair Promise, PhD, MD  This document serves as a record of services personally performed by Gery Pray, MD. It was created on his behalf by Clerance Lav, a trained medical scribe. The creation of this record is based on the scribe's personal observations and the provider's statements to them. This document has been checked and approved by the attending provider.

## 2019-05-10 NOTE — Progress Notes (Signed)
Ms. Esbenshade presents today for f/u with Dr. Sondra Come. Pt reports she has been using vaginal dilator and having vaginal intercourse with husband. Pt denies c/o pain. Pt denies dysuria/hematuria. Pt denies vaginal bleeding/discharge. Pt denies rectal bleeding, diarrhea/constipation. Pt denies abdominal bloating, N/V.   BP (!) 132/59 (BP Location: Left Arm, Patient Position: Sitting, Cuff Size: Large)   Pulse 67   Temp 98.5 F (36.9 C)   Resp 20   Wt 191 lb (86.6 kg)   SpO2 97%   BMI 30.83 kg/m   Wt Readings from Last 3 Encounters:  05/10/19 191 lb (86.6 kg)  01/27/19 187 lb 8 oz (85 kg)  12/03/18 187 lb 6.4 oz (85 kg)   Loma Sousa, RN BSN

## 2019-06-07 DIAGNOSIS — M533 Sacrococcygeal disorders, not elsewhere classified: Secondary | ICD-10-CM | POA: Diagnosis not present

## 2019-06-09 DIAGNOSIS — M533 Sacrococcygeal disorders, not elsewhere classified: Secondary | ICD-10-CM | POA: Diagnosis not present

## 2019-06-24 ENCOUNTER — Other Ambulatory Visit (HOSPITAL_BASED_OUTPATIENT_CLINIC_OR_DEPARTMENT_OTHER): Payer: Self-pay | Admitting: Family Medicine

## 2019-06-24 DIAGNOSIS — Z1231 Encounter for screening mammogram for malignant neoplasm of breast: Secondary | ICD-10-CM

## 2019-07-27 ENCOUNTER — Ambulatory Visit (HOSPITAL_BASED_OUTPATIENT_CLINIC_OR_DEPARTMENT_OTHER)
Admission: RE | Admit: 2019-07-27 | Discharge: 2019-07-27 | Disposition: A | Discharge status: Home / Self Care | Payer: Medicare Other | Source: Ambulatory Visit | Attending: Family Medicine | Admitting: Family Medicine

## 2019-07-27 ENCOUNTER — Other Ambulatory Visit: Payer: Self-pay

## 2019-07-27 ENCOUNTER — Encounter (HOSPITAL_BASED_OUTPATIENT_CLINIC_OR_DEPARTMENT_OTHER): Payer: Self-pay

## 2019-07-27 DIAGNOSIS — Z1231 Encounter for screening mammogram for malignant neoplasm of breast: Secondary | ICD-10-CM | POA: Insufficient documentation

## 2019-08-03 ENCOUNTER — Encounter: Payer: Self-pay | Admitting: Gynecologic Oncology

## 2019-08-03 ENCOUNTER — Inpatient Hospital Stay: Payer: Medicare Other | Attending: Gynecologic Oncology | Admitting: Gynecologic Oncology

## 2019-08-03 ENCOUNTER — Telehealth: Payer: Self-pay | Admitting: *Deleted

## 2019-08-03 ENCOUNTER — Other Ambulatory Visit: Payer: Self-pay

## 2019-08-03 VITALS — BP 142/61 | HR 60 | Temp 98.4°F | Resp 17 | Ht 66.0 in | Wt 191.0 lb

## 2019-08-03 DIAGNOSIS — Z79899 Other long term (current) drug therapy: Secondary | ICD-10-CM | POA: Diagnosis not present

## 2019-08-03 DIAGNOSIS — Z90710 Acquired absence of both cervix and uterus: Secondary | ICD-10-CM | POA: Insufficient documentation

## 2019-08-03 DIAGNOSIS — E039 Hypothyroidism, unspecified: Secondary | ICD-10-CM | POA: Insufficient documentation

## 2019-08-03 DIAGNOSIS — Z6831 Body mass index (BMI) 31.0-31.9, adult: Secondary | ICD-10-CM | POA: Insufficient documentation

## 2019-08-03 DIAGNOSIS — Z800 Family history of malignant neoplasm of digestive organs: Secondary | ICD-10-CM | POA: Diagnosis not present

## 2019-08-03 DIAGNOSIS — Z90722 Acquired absence of ovaries, bilateral: Secondary | ICD-10-CM | POA: Insufficient documentation

## 2019-08-03 DIAGNOSIS — E669 Obesity, unspecified: Secondary | ICD-10-CM | POA: Insufficient documentation

## 2019-08-03 DIAGNOSIS — Z8542 Personal history of malignant neoplasm of other parts of uterus: Secondary | ICD-10-CM | POA: Insufficient documentation

## 2019-08-03 DIAGNOSIS — Z9079 Acquired absence of other genital organ(s): Secondary | ICD-10-CM | POA: Insufficient documentation

## 2019-08-03 DIAGNOSIS — C541 Malignant neoplasm of endometrium: Secondary | ICD-10-CM

## 2019-08-03 DIAGNOSIS — L900 Lichen sclerosus et atrophicus: Secondary | ICD-10-CM | POA: Insufficient documentation

## 2019-08-03 DIAGNOSIS — Z923 Personal history of irradiation: Secondary | ICD-10-CM | POA: Insufficient documentation

## 2019-08-03 MED ORDER — CLOBETASOL PROPIONATE 0.05 % EX OINT: 1.0000 "application " | TOPICAL_OINTMENT | Freq: Every evening | CUTANEOUS | 0 refills | Status: DC

## 2019-08-03 NOTE — Progress Notes (Signed)
[name]  Chief Complaint:  Chief Complaint  Patient presents with  . Endometrial carcinoma ([place])    Follow Up   [name] has a diagnosis of stage IB grade 1 endometrioid endometrial cancer with deep myometrial invasion. S/p adjuvant radiation (vaginal brachytherapy - completed 10/28/18.  No evidence of disease.   Clobetasol for lichen sclerosis  Assessment/Plan:  [name]  is a 79 y.o.  year old with stage IB grade 1 endometrial cancer.  High/intermediate risk factors for recurrence. Recommendation is for vaginal brachytherapy to reduce risk for local recurrence in accordance with NCCN guidelines.  I discussed this with the patient. I discussed the role of adjuvant therapy. I discussed prognosis and risk for recurrence. We reviewed symptoms concerning for recurrence and she will see me if these develop prior to her scheduled appointment.  After completing adjuvant therapy I recommend she follow-up at 3 monthly intervals for symptom review, physical examination and pelvic examination. Pap smear is not recommended in routine endometrial cancer surveillance. After 2 years we will space these visits to every 6 months, and then annually if recurrence has not developed within 5 years. All questions were answered.   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 The patient's history began in May 2020 when she began experiencing vaginal spotting and was seen by Dr. Murrell Redden performed a transvaginal ultrasound scan which showed a thickened endometrium which then prompted an endometrial biopsy which was performed on Jul 27, 2018.  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  The patient is an otherwise fairly healthy woman.  She is obese by BMI definition with a BMI of 31 kg/m.  She has hypothyroidism.  Her only prior abdominal  surgery is a tubal ligation.  Family history is remarkable for mother with colon cancer.  Patient is nulliparous.  She has no history of exogenous hormone use.  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  On August 20, 2018 she underwent a robotic assisted total hysterectomy, BSO sentinel lymph node biopsy.  Intraoperative findings were unremarkable for extrauterine disease.  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  Sentinel lymph nodes were negative for metastatic disease.  The cervix, and adnexa are also negative for metastatic disease.  There was no lymphovascular or perineural invasion seen.  Due to her deep myometrial invasion and at her age she met criteria for intermediate to high risk for recurrence.  She was recommended adjuvant radiation to the vaginal cuff in accordance with NCCN guidelines.  Interval Hx:  She received vaginal cuff brachytherapy (30 Pearline Cables in 5 fractions) between the dates of July 22 and October 28, 2018.  She tolerated treatment well.  She returns today for follow-up and reported vulvar irritation around the introitus.   Current Meds:  Outpatient Encounter Medications as of 08/03/2019  Medication Sig  . acetaminophen (TYLENOL) 500 MG tablet Take 1,000 mg by mouth every 6 (six) hours as needed for headache.   . Calcium-Phosphorus-Vitamin D (CALCIUM GUMMIES) [phone] MG-MG-UNIT CHEW Chew 2 tablets by mouth at bedtime.   . carboxymethylcellul-glycerin (OPTIVE) 0.5-0.9 % ophthalmic solution Place 1 drop into both eyes 2 (two) times daily as needed for dry eyes.  Marland Kitchen levothyroxine (SYNTHROID) 75 MCG tablet Take 1 tablet (  75 mcg total) by mouth daily.  . Lubricants (ASTROGLIDE) GEL Apply 1 applicator topically as needed.  . Multiple Vitamins-Minerals (MULTIVITAMIN WITH MINERALS) tablet Take 1 tablet by mouth  every evening. Centrum Silver  . clobetasol ointment (TEMOVATE) 2.70 % Apply 1 application topically at bedtime.   No facility-administered encounter medications on file as of 08/03/2019.    Allergy:  Allergies  Allergen Reactions  . Penicillins Hives    Did it involve swelling of the face/tongue/throat, SOB, or low BP? No Did it involve sudden or severe rash/hives, skin peeling, or any reaction on the inside of your mouth or nose? Unknown Did you need to seek medical attention at a hospital or doctor's office? Yes When did it last happen?Childhood reaction If all above answers are "NO", may proceed with cephalosporin use.     Social Hx:   Social History   Socioeconomic History  . Marital status: Married    Spouse name: Not on file  . Number of children: Not on file  . Years of education: Not on file  . Highest education level: Not on file  Occupational History  . Not on file  Tobacco Use  . Smoking status: Never Smoker  . Smokeless tobacco: Never Used  Substance and Sexual Activity  . Alcohol use: No  . Drug use: No  . Sexual activity: Yes  Other Topics Concern  . Not on file  Social History Narrative  . Not on file   Social Determinants of Health   Financial Resource Strain:   . Difficulty of Paying Living Expenses:   Food Insecurity:   . Worried About Charity fundraiser in the Last Year:   . Arboriculturist in the Last Year:   Transportation Needs:   . Film/video editor (Medical):   Marland Kitchen Lack of Transportation (Non-Medical):   Physical Activity:   . Days of Exercise per Week:   . Minutes of Exercise per Session:   Stress:   . Feeling of Stress :   Social Connections:   . Frequency of Communication with Friends and Family:   . Frequency of Social Gatherings with Friends and Family:   . Attends Religious Services:   . Active Member of Clubs or Organizations:   . Attends Archivist Meetings:   Marland Kitchen Marital Status:   Intimate Partner Violence:    . Fear of Current or Ex-Partner:   . Emotionally Abused:   Marland Kitchen Physically Abused:   . Sexually Abused:     Past Surgical Hx:  Past Surgical History:  Procedure Laterality Date  . DG  BONE DENSITY ([place] HX)    . KNEE SURGERY    . ROBOTIC ASSISTED TOTAL HYSTERECTOMY WITH BILATERAL SALPINGO OOPHERECTOMY Bilateral 08/20/2018   Procedure: XI ROBOTIC ASSISTED TOTAL HYSTERECTOMY WITH BILATERAL SALPINGO OOPHORECTOMY;  Surgeon: Everitt Amber, MD;  Location: [facility];  Service: Gynecology;  Laterality: Bilateral;  . SENTINEL NODE BIOPSY N/A 08/20/2018   Procedure: SENTINEL LYMPH  NODE BIOPSY;  Surgeon: Everitt Amber, MD;  Location: [facility];  Service: Gynecology;  Laterality: N/A;  . TUBAL LIGATION      Past Medical Hx:  Past Medical History:  Diagnosis Date  . Allergy   . Arthritis   . Cataract   . Thyroid disease     Past Gynecological History:  See HPI No LMP recorded. Patient has had a hysterectomy.  Family Hx:  Family History  Problem Relation Age of Onset  . Vascular Disease Mother   .  Colon cancer Mother   . Dementia Father     Review of Systems:  Constitutional  Feels well,    ENT Normal appearing ears and nares bilaterally Skin/Breast  No rash, sores, jaundice, itching, dryness Cardiovascular  No chest pain, shortness of breath, or edema  Pulmonary  No cough or wheeze.  Gastro Intestinal  No nausea, vomitting, or diarrhoea. No bright red blood per rectum, no abdominal pain, change in bowel movement, or constipation.  Genito Urinary  No frequency, urgency, dysuria,no bleeding Musculo Skeletal  No myalgia, arthralgia, joint swelling or pain  Neurologic  No weakness, numbness, change in gait,  Psychology  No depression, anxiety, insomnia.   Vitals:  Blood pressure (!) 142/61, pulse 60, temperature 98.4 F (36.9 C), temperature source Oral, resp. rate 17, height _0  (1.676 m), weight 191 lb (86.6 kg), SpO2 99 %.  Physical Exam: WD in NAD Neck  Supple NROM, without  any enlargements.  Lymph Node Survey No cervical supraclavicular or inguinal adenopathy Cardiovascular  Pulse normal rate, regularity and rhythm. S1 and S2 normal.  Lungs  Clear to auscultation bilateraly, without wheezes/crackles/rhonchi. Good air movement.  Skin  No rash/lesions/breakdown  Psychiatry  Alert and oriented to person, place, and time  Abdomen  Normoactive bowel sounds, abdomen soft, non-tender and mildly obese without evidence of hernia.  Back No CVA tenderness Genito Urinary  Vulva/vagina: Lichen sclerosis distribution around vulvar - particularly labia majora, circumferential.   Bladder/urethra:  No lesions or masses, well supported bladder  Vaginal cuff: smooth, in tact, no lesions or masses  Cervix and uterus: surgically absent  Adnexa: no palpable masses. Rectal  deferred Extremities  No bilateral cyanosis, clubbing or edema.   Thereasa Solo, MD  08/03/2019, 2:02 PM

## 2019-08-03 NOTE — Patient Instructions (Addendum)
Plan to follow up with Dr. Denman George in November 2021 or sooner if needed.  Dr. Denman George is prescribing clobetasol cream to apply to the vulva at nighttime.

## 2019-08-03 NOTE — Telephone Encounter (Signed)
Returned the patient's call;  Patient stated that "the clobetasol cost over $200 and I can't afford it. Is it ok not to use it. I did use vagisil years and it helped. CAn I use that if I need too." Per Melissa APP and Dr Denman George ok to not use the clobetasol and use vagisil.

## 2019-08-31 NOTE — Patient Instructions (Addendum)
It is great to see you again today as always I will set you up for a bone density scan this September I will be in touch with your labs and will refill your thyroid med   Please consider getting your shingles vaccine at the pharmacy if not done already You can also get a tetanus booster at your drug store

## 2019-08-31 NOTE — Progress Notes (Addendum)
[facility] [address] [place], [place] [address] [phone]  Date:  09/02/2019   Name:  Nicole Brennan   DOB:  1940-12-17   MRN:  [id number]  PCP:  Darreld Mclean, MD    Chief Complaint: Follow-up   History of Present Illness:  Nicole Brennan is a 79 y.o. very pleasant female patient who presents with the following:  Patient here today for follow-up visit and lab update History of hypothyroidism, osteoporosis, endometrial cancer Endometrial cancer was diagnosed about 1 year ago, May 2020.  Hysterectomy performed in June 2020, she was then treated with radiation for about 1 month per DR Kinard Her gynecologic oncologist is Dr. 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 This does not bother her as much during the day-she thinks she urinates frequently during the day as well, but is not really a bother This has been going on for a year or so  No dysuria or hematuria She does not feel like radiation make this worse per se, but she is not really sure  Admits that she does tend to sip on fluids all day, does not fluid restrict before bedtime Patient Active Problem List   Diagnosis Date Noted  . Lichen sclerosus 26/33/3545  . Endometrial carcinoma ([place])  08/05/2018  . Obesity (BMI 30.0-34.9) 08/05/2018  . Seasonal allergies 04/21/2012  . Cataract 04/21/2012  . Osteoporosis 05/01/2011  . Hypothyroidism 04/30/2011  . Osteoarthritis 04/30/2011    Past Medical History:  Diagnosis Date  . Allergy   . Arthritis   . Cataract   . Thyroid disease     Past Surgical History:  Procedure Laterality Date  . DG  BONE DENSITY ([place] HX)    . KNEE SURGERY    . ROBOTIC ASSISTED TOTAL HYSTERECTOMY WITH BILATERAL SALPINGO OOPHERECTOMY Bilateral 08/20/2018   Procedure: XI ROBOTIC ASSISTED TOTAL HYSTERECTOMY WITH BILATERAL SALPINGO OOPHORECTOMY;  Surgeon: Everitt Amber, MD;  Location: [facility];  Service: Gynecology;  Laterality: Bilateral;  . SENTINEL NODE BIOPSY N/A 08/20/2018   Procedure: SENTINEL LYMPH  NODE BIOPSY;  Surgeon: Everitt Amber, MD;  Location: [facility];  Service: Gynecology;  Laterality: N/A;  . TUBAL LIGATION      Social History   Tobacco Use  . Smoking status: Never Smoker  . Smokeless tobacco: Never Used  Vaping Use  . Vaping Use: Never used  Substance Use Topics  . Alcohol use: No  . Drug use: No    Family History  Problem Relation Age of Onset  . Vascular Disease Mother   . Colon cancer Mother   . Dementia Father     Allergies  Allergen Reactions  . Penicillins Hives    Did it involve swelling of the face/tongue/throat, SOB, or low  BP? No Did it involve sudden or severe rash/hives, skin peeling, or any reaction on the inside of your mouth or nose? Unknown Did you need to seek medical attention at a hospital or doctor's office? Yes When did it last happen?Childhood reaction If all above answers are "NO", may proceed with cephalosporin use.     Medication list has been reviewed and updated.  Current Outpatient Medications on File Prior to Visit  Medication Sig Dispense Refill  . acetaminophen (TYLENOL) 500 MG tablet Take 1,000 mg by mouth every 6 (six) hours as needed for headache.     . Calcium-Phosphorus-Vitamin D  (CALCIUM GUMMIES) [phone] MG-MG-UNIT CHEW Chew 2 tablets by mouth at bedtime.     . carboxymethylcellul-glycerin (OPTIVE) 0.5-0.9 % ophthalmic solution Place 1 drop into both eyes 2 (two) times daily as needed for dry eyes.    Marland Kitchen levothyroxine (SYNTHROID) 75 MCG tablet Take 1 tablet (75 mcg total) by mouth daily. 90 tablet 3  . Lubricants (ASTROGLIDE) GEL Apply 1 applicator topically as needed. 66.5 g 3  . Multiple Vitamins-Minerals (MULTIVITAMIN WITH MINERALS) tablet Take 1 tablet by mouth every evening. Centrum Silver     No current facility-administered medications on file prior to visit.    Review of Systems:  As per HPI- otherwise negative.   Physical Examination: Vitals:   09/02/19 0854  BP: 110/62  Pulse: 77  Resp: 12  Temp: (!) 97.3 F (36.3 C)  SpO2: 97%   Vitals:   09/02/19 0854  Weight: 188 lb 6.4 oz (85.5 kg)  Height: 5\' 5"  (1.651 m)   Body mass index is 31.35 kg/m. Ideal Body Weight: Weight in (lb) to have BMI = 25: 149.9  GEN: no acute distress.  Looks well and younger than age, mild obesity HEENT: Atraumatic, Normocephalic.    Ears and Nose: No external deformity. CV: RRR, No M/G/R. No JVD. No thrill. No extra heart sounds. PULM: CTA B, no wheezes, crackles, rhonchi. No retractions. No resp. distress. No accessory muscle use. ABD: S, NT, ND, +BS. No rebound. No HSM. EXTR: No c/c/e PSYCH: Normally interactive. Conversant.    Assessment and Plan: Other specified hypothyroidism - Plan: TSH  Vaginal dryness  Postmenopausal estrogen deficiency  Screening for deficiency anemia - Plan: CBC  Screening for hyperlipidemia - Plan: Lipid panel  Encounter for hepatitis C screening test for low risk patient - Plan: Hepatitis C antibody  Screening for diabetes mellitus - Plan: Comprehensive metabolic panel  Macrocytosis - Plan: CBC  Patient here today for follow up visit.  Labs are pending as above She has noticed urinary frequency, seems to be worse at  night.  We will check her blood sugar today to make sure diabetes not a concern.  Assuming this is normal, I suggested that she try fluid restriction for a couple hours before bedtime.  If this does not improve her symptoms, we could consider DDAVP Whenever recommended immunizations Ordered bone density scan Need to remind patient about colon cancer screening with lab results Refill thyroid medication with TSH level This visit occurred during the SARS-CoV-2 public health emergency.  Safety protocols were in place, including screening questions prior to the visit, additional usage of staff PPE, and extensive cleaning of exam room while observing appropriate contact time as indicated for disinfecting solutions.    Signed Lamar Blinks, MD  Her rx go to [facility]- mail away Needs her synthroid sent there online   Received her labs as below, message to patient Refill thyroid medication Results  for orders placed or performed in visit on 09/02/19  CBC  Result Value Ref Range   WBC 4.4 4.0 - 10.5 K/uL   RBC 3.98 3.87 - 5.11 Mil/uL   Platelets 323.0 150 - 400 K/uL   Hemoglobin 13.0 12.0 - 15.0 g/dL   HCT 39.3 36 - 46 %   MCV 98.8 78.0 - 100.0 fl   MCHC 33.1 30.0 - 36.0 g/dL   RDW 13.7 11.5 - 15.5 %  Lipid panel  Result Value Ref Range   Cholesterol 188 0 - 200 mg/dL   Triglycerides 128.0 0 - 149 mg/dL   HDL 53.90 >39.00 mg/dL   VLDL 25.6 0.0 - 40.0 mg/dL   LDL Cholesterol 108 (H) 0 - 99 mg/dL   Total CHOL/HDL Ratio 3    NonHDL 134.06   TSH  Result Value Ref Range   TSH 2.95 0.35 - 4.50 uIU/mL  Comprehensive metabolic panel  Result Value Ref Range   Sodium 141 135 - 145 mEq/L   Potassium 4.1 3.5 - 5.1 mEq/L   Chloride 107 96 - 112 mEq/L   CO2 29 19 - 32 mEq/L   Glucose, Bld 99 70 - 99 mg/dL   BUN 14 6 - 23 mg/dL   Creatinine, Ser 0.89 0.40 - 1.20 mg/dL   Total Bilirubin 0.5 0.2 - 1.2 mg/dL   Alkaline Phosphatase 57 39 - 117 U/L   AST 22 0 - 37 U/L   ALT 21 0 - 35 U/L   Total  Protein 6.5 6.0 - 8.3 g/dL   Albumin 4.3 3.5 - 5.2 g/dL   GFR 61.42 >60.00 mL/min   Calcium 9.9 8.4 - 10.5 mg/dL

## 2019-09-02 ENCOUNTER — Encounter: Payer: Self-pay | Admitting: Family Medicine

## 2019-09-02 ENCOUNTER — Other Ambulatory Visit: Payer: Self-pay

## 2019-09-02 ENCOUNTER — Ambulatory Visit (INDEPENDENT_AMBULATORY_CARE_PROVIDER_SITE_OTHER): Payer: Medicare Other | Admitting: Family Medicine

## 2019-09-02 VITALS — BP 110/62 | HR 77 | Temp 97.3°F | Resp 12 | Ht 65.0 in | Wt 188.4 lb

## 2019-09-02 DIAGNOSIS — Z131 Encounter for screening for diabetes mellitus: Secondary | ICD-10-CM | POA: Diagnosis not present

## 2019-09-02 DIAGNOSIS — N898 Other specified noninflammatory disorders of vagina: Secondary | ICD-10-CM

## 2019-09-02 DIAGNOSIS — Z780 Asymptomatic menopausal state: Secondary | ICD-10-CM

## 2019-09-02 DIAGNOSIS — Z130 Encounter for screening for diseases of the blood and blood-forming organs and certain disorders involving the immune mechanism: Secondary | ICD-10-CM | POA: Diagnosis not present

## 2019-09-02 DIAGNOSIS — E038 Other specified hypothyroidism: Secondary | ICD-10-CM | POA: Diagnosis not present

## 2019-09-02 DIAGNOSIS — Z13220 Encounter for screening for lipoid disorders: Secondary | ICD-10-CM

## 2019-09-02 DIAGNOSIS — D7589 Other specified diseases of blood and blood-forming organs: Secondary | ICD-10-CM | POA: Diagnosis not present

## 2019-09-02 DIAGNOSIS — Z1159 Encounter for screening for other viral diseases: Secondary | ICD-10-CM

## 2019-09-02 LAB — TSH: TSH: 2.95 u[IU]/mL (ref 0.35–4.50)

## 2019-09-02 LAB — LIPID PANEL
Cholesterol: 188 mg/dL (ref 0–200)
HDL: 53.9 mg/dL (ref >39.00)
LDL Cholesterol: 108 mg/dL — ABNORMAL HIGH (ref 0–99)
NonHDL: 134.06
Total CHOL/HDL Ratio: 3
Triglycerides: 128 mg/dL (ref 0.0–149.0)
VLDL: 25.6 mg/dL (ref 0.0–40.0)

## 2019-09-02 LAB — COMPREHENSIVE METABOLIC PANEL
ALT: 21 U/L (ref 0–35)
AST: 22 U/L (ref 0–37)
Albumin: 4.3 g/dL (ref 3.5–5.2)
Alkaline Phosphatase: 57 U/L (ref 39–117)
BUN: 14 mg/dL (ref 6–23)
CO2: 29 mEq/L (ref 19–32)
Calcium: 9.9 mg/dL (ref 8.4–10.5)
Chloride: 107 mEq/L (ref 96–112)
Creatinine, Ser: 0.89 mg/dL (ref 0.40–1.20)
GFR: 61.42 mL/min (ref >60.00)
Glucose, Bld: 99 mg/dL (ref 70–99)
Potassium: 4.1 mEq/L (ref 3.5–5.1)
Sodium: 141 mEq/L (ref 135–145)
Total Bilirubin: 0.5 mg/dL (ref 0.2–1.2)
Total Protein: 6.5 g/dL (ref 6.0–8.3)

## 2019-09-02 LAB — CBC
HCT: 39.3 % (ref 36.0–46.0)
Hemoglobin: 13 g/dL (ref 12.0–15.0)
MCHC: 33.1 g/dL (ref 30.0–36.0)
MCV: 98.8 fl (ref 78.0–100.0)
Platelets: 323 10*3/uL (ref 150.0–400.0)
RBC: 3.98 Mil/uL (ref 3.87–5.11)
RDW: 13.7 % (ref 11.5–15.5)
WBC: 4.4 10*3/uL (ref 4.0–10.5)

## 2019-09-02 MED ORDER — LEVOTHYROXINE SODIUM 75 MCG PO TABS: 75.0000 ug | ORAL_TABLET | Freq: Every day | ORAL | 3 refills | Status: DC

## 2019-09-02 NOTE — Addendum Note (Signed)
Addended by: Lamar Blinks C on: 09/02/2019 08:26 PM   Modules accepted: Orders

## 2019-09-03 LAB — HEPATITIS C ANTIBODY
Hepatitis C Ab: NONREACTIVE
SIGNAL TO CUT-OFF: 0.01 (ref <1.00)

## 2019-09-06 NOTE — Telephone Encounter (Signed)
Cologuard ordered for patient.  

## 2019-09-06 NOTE — Telephone Encounter (Signed)
Order cologuard for pt

## 2019-09-16 ENCOUNTER — Encounter: Payer: Self-pay | Admitting: Family Medicine

## 2019-09-16 DIAGNOSIS — Z1211 Encounter for screening for malignant neoplasm of colon: Secondary | ICD-10-CM | POA: Diagnosis not present

## 2019-09-16 LAB — COLOGUARD: Cologuard: NEGATIVE

## 2019-09-17 MED ORDER — SHINGRIX 50 MCG/0.5ML IM SUSR: 0.5000 mL | Freq: Once | INTRAMUSCULAR | 1 refills | Status: AC

## 2019-09-17 NOTE — Addendum Note (Signed)
Addended by: Wynonia Musty A on: 09/17/2019 02:39 PM   Modules accepted: Orders

## 2019-09-22 ENCOUNTER — Encounter: Payer: Self-pay | Admitting: Family Medicine

## 2019-09-22 LAB — COLOGUARD
COLOGUARD: NEGATIVE
Cologuard: NEGATIVE

## 2019-09-22 LAB — EXTERNAL GENERIC LAB PROCEDURE: COLOGUARD: NEGATIVE

## 2019-10-25 ENCOUNTER — Other Ambulatory Visit: Payer: Self-pay

## 2019-10-25 ENCOUNTER — Encounter: Payer: Self-pay | Admitting: Radiation Oncology

## 2019-10-25 ENCOUNTER — Ambulatory Visit
Admission: RE | Admit: 2019-10-25 | Discharge: 2019-10-25 | Disposition: A | Discharge status: Home / Self Care | Payer: Medicare Other | Source: Ambulatory Visit | Attending: Radiation Oncology | Admitting: Radiation Oncology

## 2019-10-25 DIAGNOSIS — Z79899 Other long term (current) drug therapy: Secondary | ICD-10-CM | POA: Insufficient documentation

## 2019-10-25 DIAGNOSIS — C541 Malignant neoplasm of endometrium: Secondary | ICD-10-CM

## 2019-10-25 DIAGNOSIS — Z08 Encounter for follow-up examination after completed treatment for malignant neoplasm: Secondary | ICD-10-CM | POA: Diagnosis not present

## 2019-10-25 DIAGNOSIS — Z8542 Personal history of malignant neoplasm of other parts of uterus: Secondary | ICD-10-CM | POA: Insufficient documentation

## 2019-10-25 NOTE — Progress Notes (Signed)
Patient here for a f/u visit with Dr. Sondra Come. She denies pain or vaginal bleeding. She denies dysuria or problems with her bowels. Patient is asking how long she will need to continue with the dialator.  BP (!) 139/53 (BP Location: Right Arm, Patient Position: Sitting, Cuff Size: Normal)   Pulse 70   Temp 97.9 F (36.6 C)   Resp 20   Ht 5\' 5"  (1.651 m)   Wt 191 lb (86.6 kg)   SpO2 99%   BMI 31.78 kg/m   Wt Readings from Last 3 Encounters:  10/25/19 191 lb (86.6 kg)  09/02/19 188 lb 6.4 oz (85.5 kg)  08/03/19 191 lb (86.6 kg)

## 2019-10-25 NOTE — Progress Notes (Signed)
Radiation Oncology         [phone] ________________________________  Name: Nicole Brennan MRN: [id number]  Date: 10/25/2019  DOB: [birth year]  Follow-Up Visit Note  CC: Copland, Gay Filler, MD  Copland, Gay Filler, MD    ICD-10-CM   1. Endometrial carcinoma (HCC)  C54.1     Diagnosis:  FIGOStage I-B, grade 1endometrioid adenocarcinoma, pT1b, pN0  Interval Since Last Radiation: One year  09/30/2018, 10/07/2018, 10/14/2018, 10/21/2018, 10/28/2018: Vaginal Cuff (Brachytherapy) / 30 Gy in 5 fractions  Narrative:  The patient returns today for routine follow-up. She is doing well overall. Since her last visit, she underwent a bilateral screening mammogram on 07/27/2019 that did not show any mammographic evidence of malignancy.  She followed up with Dr. Denman George on 08/03/2019. During that time, she reported vulvar irritation around the introitus. One examination, she was noted to have lichen sclerosis distribution around the vulvar - particularly labia majora, circumferential. Clobestasol was prescribed, however she could not afford it and substituted Vagisil. There was no evidence of disease recurrence.  On review of systems, she reports no complaints. She denies pelvic pain, vaginal bleeding, dysuria, and changes in bowel patterns.  She denies any abdominal bloating.  She denies any further problems with vulvar pruritus.  She continues to use her vaginal dilator as recommended.  She denies any spotting with use of her dilator.  ALLERGIES:  is allergic to penicillins.  Meds: Current Outpatient Medications  Medication Sig Dispense Refill  . acetaminophen (TYLENOL) 500 MG tablet Take 1,000 mg by mouth every 6 (six) hours as needed for headache.     . Calcium-Phosphorus-Vitamin D (CALCIUM GUMMIES) [phone] MG-MG-UNIT CHEW Chew 2 tablets by mouth at bedtime.     Marland Kitchen levothyroxine (SYNTHROID) 75 MCG tablet Take 1 tablet (75 mcg total) by mouth daily. 90 tablet 3  . Lubricants  (ASTROGLIDE) GEL Apply 1 applicator topically as needed. 66.5 g 3  . Multiple Vitamins-Minerals (MULTIVITAMIN WITH MINERALS) tablet Take 1 tablet by mouth every evening. Centrum Silver    . carboxymethylcellul-glycerin (OPTIVE) 0.5-0.9 % ophthalmic solution Place 1 drop into both eyes 2 (two) times daily as needed for dry eyes. (Patient not taking: Reported on 10/25/2019)     No current facility-administered medications for this encounter.    Physical Findings: The patient is in no acute distress. Patient is alert and oriented.  height is 5\' 5"  (1.651 m) and weight is 191 lb (86.6 kg). Her temperature is 97.9 F (36.6 C). Her blood pressure is 139/53 (abnormal) and her pulse is 70. Her respiration is 20 and oxygen saturation is 99%.  No significant changes. Lungs are clear to auscultation bilaterally. Heart has regular rate and rhythm. No palpable cervical, supraclavicular, or axillary adenopathy. Abdomen soft, non-tender, normal bowel sounds. On pelvic examination the external genitalia were unremarkable. A speculum exam was performed. There are no mucosal lesions noted in the vaginal vault. On bimanual  examination there were no pelvic masses appreciated. Vaginal cuff intact.   Lab Findings: Lab Results  Component Value Date   WBC 4.4 09/02/2019   HGB 13.0 09/02/2019   HCT 39.3 09/02/2019   MCV 98.8 09/02/2019   PLT 323.0 09/02/2019    Radiographic Findings: No results found.  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   Plan: The patient will follow up with Dr. Denman George on 01/28/2020 and with radiation oncology in six months.  Total time spent in this encounter was  25 minutes which included reviewing the patient's most recent mammogram, follow-up with Dr. Denman George, physical examination, and documentation.  ____________________________________   Blair Promise, PhD, MD  This document serves as a record  of services personally performed by Gery Pray, MD. It was created on his behalf by Clerance Lav, a trained medical scribe. The creation of this record is based on the scribe's personal observations and the provider's statements to them. This document has been checked and approved by the attending provider.

## 2019-11-25 ENCOUNTER — Other Ambulatory Visit: Payer: Self-pay

## 2019-11-25 ENCOUNTER — Encounter: Payer: Self-pay | Admitting: Family Medicine

## 2019-11-25 ENCOUNTER — Ambulatory Visit (HOSPITAL_BASED_OUTPATIENT_CLINIC_OR_DEPARTMENT_OTHER)
Admission: RE | Admit: 2019-11-25 | Discharge: 2019-11-25 | Disposition: A | Discharge status: Home / Self Care | Payer: Medicare Other | Source: Ambulatory Visit | Attending: Family Medicine | Admitting: Family Medicine

## 2019-11-25 DIAGNOSIS — M85852 Other specified disorders of bone density and structure, left thigh: Secondary | ICD-10-CM | POA: Diagnosis not present

## 2019-11-25 DIAGNOSIS — E039 Hypothyroidism, unspecified: Secondary | ICD-10-CM | POA: Diagnosis not present

## 2019-11-25 DIAGNOSIS — Z780 Asymptomatic menopausal state: Secondary | ICD-10-CM | POA: Diagnosis not present

## 2019-11-25 DIAGNOSIS — E2839 Other primary ovarian failure: Secondary | ICD-10-CM | POA: Diagnosis not present

## 2019-12-09 ENCOUNTER — Other Ambulatory Visit: Payer: Self-pay

## 2019-12-09 ENCOUNTER — Ambulatory Visit (INDEPENDENT_AMBULATORY_CARE_PROVIDER_SITE_OTHER): Payer: Medicare Other

## 2019-12-09 DIAGNOSIS — Z23 Encounter for immunization: Secondary | ICD-10-CM | POA: Diagnosis not present

## 2020-01-26 ENCOUNTER — Telehealth: Payer: Self-pay

## 2020-01-26 ENCOUNTER — Ambulatory Visit: Payer: Medicare Other | Admitting: Gynecologic Oncology

## 2020-01-26 NOTE — Telephone Encounter (Signed)
Nicole Brennan called to confirm that she will be at her appointment this Friday 01-28-20 at 1315.

## 2020-01-27 ENCOUNTER — Telehealth: Payer: Self-pay

## 2020-01-27 ENCOUNTER — Encounter: Payer: Self-pay | Admitting: Gynecologic Oncology

## 2020-01-27 NOTE — Telephone Encounter (Signed)
No answer, left message to return call

## 2020-01-28 ENCOUNTER — Telehealth: Payer: Self-pay

## 2020-01-28 ENCOUNTER — Inpatient Hospital Stay: Payer: Medicare Other | Attending: Gynecologic Oncology | Admitting: Gynecologic Oncology

## 2020-01-28 ENCOUNTER — Encounter: Payer: Self-pay | Admitting: Gynecologic Oncology

## 2020-01-28 ENCOUNTER — Other Ambulatory Visit: Payer: Self-pay

## 2020-01-28 ENCOUNTER — Other Ambulatory Visit: Payer: Self-pay | Admitting: Gynecologic Oncology

## 2020-01-28 VITALS — BP 137/65 | HR 72 | Temp 97.4°F | Resp 18 | Wt 190.0 lb

## 2020-01-28 DIAGNOSIS — Z08 Encounter for follow-up examination after completed treatment for malignant neoplasm: Secondary | ICD-10-CM | POA: Insufficient documentation

## 2020-01-28 DIAGNOSIS — Z6831 Body mass index (BMI) 31.0-31.9, adult: Secondary | ICD-10-CM | POA: Diagnosis not present

## 2020-01-28 DIAGNOSIS — Z923 Personal history of irradiation: Secondary | ICD-10-CM

## 2020-01-28 DIAGNOSIS — Z8542 Personal history of malignant neoplasm of other parts of uterus: Secondary | ICD-10-CM | POA: Insufficient documentation

## 2020-01-28 DIAGNOSIS — Z90722 Acquired absence of ovaries, bilateral: Secondary | ICD-10-CM | POA: Insufficient documentation

## 2020-01-28 DIAGNOSIS — Z8541 Personal history of malignant neoplasm of cervix uteri: Secondary | ICD-10-CM

## 2020-01-28 DIAGNOSIS — E039 Hypothyroidism, unspecified: Secondary | ICD-10-CM | POA: Insufficient documentation

## 2020-01-28 DIAGNOSIS — C541 Malignant neoplasm of endometrium: Secondary | ICD-10-CM

## 2020-01-28 DIAGNOSIS — N952 Postmenopausal atrophic vaginitis: Secondary | ICD-10-CM | POA: Insufficient documentation

## 2020-01-28 DIAGNOSIS — E669 Obesity, unspecified: Secondary | ICD-10-CM | POA: Diagnosis not present

## 2020-01-28 DIAGNOSIS — Z90710 Acquired absence of both cervix and uterus: Secondary | ICD-10-CM | POA: Diagnosis not present

## 2020-01-28 DIAGNOSIS — N852 Hypertrophy of uterus: Secondary | ICD-10-CM | POA: Diagnosis not present

## 2020-01-28 MED ORDER — ESTRADIOL 0.1 MG/GM VA CREA: 1.0000 | TOPICAL_CREAM | VAGINAL | 3 refills | Status: DC

## 2020-01-28 MED ORDER — PREMARIN 0.625 MG/GM VA CREA: 1.0000 | TOPICAL_CREAM | Freq: Every day | VAGINAL | 12 refills | Status: DC

## 2020-01-28 MED FILL — ESTRADIOL 0.1 MG/GM CREA: 0.1 | 4 days supply | Qty: 43 | Fill #0

## 2020-01-28 NOTE — Patient Instructions (Signed)
Dr Denman George has prescribed vaginal estrogen cream for you to use as needed for vaginal discomfort.  Please return to see Dr Denman George in May.

## 2020-01-28 NOTE — Telephone Encounter (Signed)
Nicole Brennan called the office and stated that the premarin vaginal cream would cost her 412.00. She cannot afford it. Told her that Melissa Cross,NP sent in Estrace vaginal cream. This is usually significantly less expensive. She is to call the office back if she cannot afford this medication either.

## 2020-01-28 NOTE — Addendum Note (Signed)
Addended by: [name] D on: 01/28/2020 04:39 PM   Modules accepted: Orders

## 2020-01-28 NOTE — Progress Notes (Signed)
[name]  Chief Complaint:  Chief Complaint  Patient presents with  . Endometrial carcinoma ([place])   [name] has a diagnosis of stage IB grade 1 endometrioid endometrial cancer with deep myometrial invasion. S/p adjuvant radiation (vaginal brachytherapy - completed 10/28/18.  No evidence of disease.   Premarin cream for vaginal atrophy.   Assessment/Plan:  [name]  is a 79 y.o.  year old with stage IB grade 1 endometrial cancer.  High/intermediate risk factors for recurrence. Recommendation is for vaginal brachytherapy to reduce risk for local recurrence in accordance with NCCN guidelines.  I discussed this with the patient. I discussed the role of adjuvant therapy. I discussed prognosis and risk for recurrence. We reviewed symptoms concerning for recurrence and she will see me if these develop prior to her scheduled appointment.  After completing adjuvant therapy I recommend she follow-up at 3 monthly intervals for symptom review, physical examination and pelvic examination. Pap smear is not recommended in routine endometrial cancer surveillance. After 2 years we will space these visits to every 6 months, and then annually if recurrence has not developed within 5 years. All questions were answered.   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 The patient's history began in May 2020 when she began experiencing vaginal spotting and was seen by Dr. Murrell Redden performed a transvaginal ultrasound scan which showed a thickened endometrium which then prompted an endometrial biopsy which was performed on Jul 27, 2018.  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  The patient is an otherwise fairly healthy woman.  She is obese by BMI definition with a BMI of 31 kg/m.  She has hypothyroidism.  Her only prior abdominal surgery is a  tubal ligation.  Family history is remarkable for mother with colon cancer.  Patient is nulliparous.  She has no history of exogenous hormone use.  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  On August 20, 2018 she underwent a robotic assisted total hysterectomy, BSO sentinel lymph node biopsy.  Intraoperative findings were unremarkable for extrauterine disease.  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  Sentinel lymph nodes were negative for metastatic disease.  The cervix, and adnexa are also negative for metastatic disease.  There was no lymphovascular or perineural invasion seen.  Due to her deep myometrial invasion and at her age she met criteria for intermediate to high risk for recurrence.  She was recommended adjuvant radiation to the vaginal cuff in accordance with NCCN guidelines.  Interval Hx:  She received vaginal cuff brachytherapy (30 Pearline Cables in 5 fractions) between the dates of July 22 and October 28, 2018.  She tolerated treatment well.  She returns today for follow-up and has persistent vulvar irritation around the introitus.   Current Meds:  Outpatient Encounter Medications as of 01/28/2020  Medication Sig  . acetaminophen (TYLENOL) 500 MG tablet Take 1,000 mg by mouth every 6 (six) hours as needed for headache.   . Calcium-Phosphorus-Vitamin D (CALCIUM GUMMIES) [phone] MG-MG-UNIT CHEW Chew 2 tablets by mouth at bedtime.   Marland Kitchen levothyroxine (SYNTHROID) 75 MCG tablet Take 1 tablet (75 mcg total) by mouth daily.  . Lubricants (ASTROGLIDE) GEL Apply 1 applicator topically as needed.  . Multiple Vitamins-Minerals (MULTIVITAMIN WITH MINERALS) tablet  Take 1 tablet by mouth every evening. Centrum Silver  . carboxymethylcellul-glycerin (OPTIVE) 0.5-0.9 % ophthalmic solution Place 1 drop into both eyes 2 (two) times daily as  needed for dry eyes. (Patient not taking: Reported on 10/25/2019)  . conjugated estrogens (PREMARIN) vaginal cream Place 1 Applicatorful vaginally daily.   No facility-administered encounter medications on file as of 01/28/2020.    Allergy:  Allergies  Allergen Reactions  . Penicillins Hives    Did it involve swelling of the face/tongue/throat, SOB, or low BP? No Did it involve sudden or severe rash/hives, skin peeling, or any reaction on the inside of your mouth or nose? Unknown Did you need to seek medical attention at a hospital or doctor's office? Yes When did it last happen?Childhood reaction If all above answers are "NO", may proceed with cephalosporin use.     Social Hx:   Social History   Socioeconomic History  . Marital status: Married    Spouse name: Not on file  . Number of children: Not on file  . Years of education: Not on file  . Highest education level: Not on file  Occupational History  . Not on file  Tobacco Use  . Smoking status: Never Smoker  . Smokeless tobacco: Never Used  Vaping Use  . Vaping Use: Never used  Substance and Sexual Activity  . Alcohol use: No  . Drug use: No  . Sexual activity: Yes  Other Topics Concern  . Not on file  Social History Narrative  . Not on file   Social Determinants of Health   Financial Resource Strain:   . Difficulty of Paying Living Expenses: Not on file  Food Insecurity:   . Worried About Charity fundraiser in the Last Year: Not on file  . Ran Out of Food in the Last Year: Not on file  Transportation Needs:   . Lack of Transportation (Medical): Not on file  . Lack of Transportation (Non-Medical): Not on file  Physical Activity:   . Days of Exercise per Week: Not on file  . Minutes of Exercise per Session: Not on file  Stress:   . Feeling of Stress : Not on file  Social Connections:   . Frequency of Communication with Friends and Family: Not on file  . Frequency of Social Gatherings with Friends and  Family: Not on file  . Attends Religious Services: Not on file  . Active Member of Clubs or Organizations: Not on file  . Attends Archivist Meetings: Not on file  . Marital Status: Not on file  Intimate Partner Violence:   . Fear of Current or Ex-Partner: Not on file  . Emotionally Abused: Not on file  . Physically Abused: Not on file  . Sexually Abused: Not on file    Past Surgical Hx:  Past Surgical History:  Procedure Laterality Date  . DG  BONE DENSITY ([place] [facility])    . KNEE SURGERY    . ROBOTIC ASSISTED TOTAL HYSTERECTOMY WITH BILATERAL SALPINGO OOPHERECTOMY Bilateral 08/20/2018   Procedure: XI ROBOTIC ASSISTED TOTAL HYSTERECTOMY WITH BILATERAL SALPINGO OOPHORECTOMY;  Surgeon: Everitt Amber, MD;  Location: [facility];  Service: Gynecology;  Laterality: Bilateral;  . SENTINEL NODE BIOPSY N/A 08/20/2018   Procedure: SENTINEL LYMPH  NODE BIOPSY;  Surgeon: Everitt Amber, MD;  Location: [facility];  Service: Gynecology;  Laterality: N/A;  . TUBAL LIGATION      Past Medical Hx:  Past Medical History:  Diagnosis Date  . Allergy   .  Arthritis   . Cataract   . Thyroid disease     Past Gynecological History:  See HPI No LMP recorded. Patient has had a hysterectomy.  Family Hx:  Family History  Problem Relation Age of Onset  . Vascular Disease Mother   . Colon cancer Mother   . Dementia Father     Review of Systems:  Constitutional  Feels well,    ENT Normal appearing ears and nares bilaterally Skin/Breast  No rash, sores, jaundice, itching, dryness Cardiovascular  No chest pain, shortness of breath, or edema  Pulmonary  No cough or wheeze.  Gastro Intestinal  No nausea, vomitting, or diarrhoea. No bright red blood per rectum, no abdominal pain, change in bowel movement, or constipation.  Genito Urinary  No frequency, urgency, dysuria,no bleeding Musculo Skeletal  No myalgia, arthralgia, joint swelling or pain  Neurologic  No weakness, numbness, change in gait,   Psychology  No depression, anxiety, insomnia.   Vitals:  Blood pressure 137/65, pulse 72, temperature (!) 97.4 F (36.3 C), temperature source Tympanic, resp. rate 18, weight 190 lb (86.2 kg), SpO2 99 %.  Physical Exam: WD in NAD Neck  Supple NROM, without any enlargements.  Lymph Node Survey No cervical supraclavicular or inguinal adenopathy Cardiovascular  Pulse normal rate, regularity and rhythm. S1 and S2 normal.  Lungs  Clear to auscultation bilateraly, without wheezes/crackles/rhonchi. Good air movement.  Skin  No rash/lesions/breakdown  Psychiatry  Alert and oriented to person, place, and time  Abdomen  Normoactive bowel sounds, abdomen soft, non-tender and mildly obese without evidence of hernia.  Back No CVA tenderness Genito Urinary  Vulva/vagina: Lichen sclerosis distribution around vulvar - particularly labia majora, circumferential.   Bladder/urethra:  No lesions or masses, well supported bladder  Vaginal cuff: smooth, in tact, no lesions or masses. Exam poorly tolerated, therefore limited evaluation.   Cervix and uterus: surgically absent  Adnexa: no palpable masses. Rectal  deferred Extremities  No bilateral cyanosis, clubbing or edema.   Thereasa Solo, MD  01/28/2020, 1:30 PM

## 2020-02-21 ENCOUNTER — Telehealth: Payer: Self-pay | Admitting: *Deleted

## 2020-02-21 MED FILL — ESTRADIOL 0.1 MG/GM CREA: 0.1 | 4 days supply | Qty: 43 | Fill #0

## 2020-02-21 NOTE — Telephone Encounter (Signed)
Told Nicole Brennan that this cream helps improve the irritation in the vaginal vulvar area from atrophy irritation that occurs with aging for women. Suggested that she see if her pharmacy has a good RX price for the cream. Pt will pick up prescription as recommended.

## 2020-02-21 NOTE — Telephone Encounter (Signed)
Patient called and stated "I saw Dr Denman George couple weeks ago and she prescribed some medicine. It cost $420 and that was to expensive. So you all got a different medicine and got it down to like $140. I wasn't having much issues when I saw her; but now I'm having  redness, itchy and on fire. I have not picked up the medicine, is there any thing else to use/pick up before I pay the $140."

## 2020-02-23 ENCOUNTER — Telehealth: Payer: Self-pay | Admitting: *Deleted

## 2020-02-23 NOTE — Telephone Encounter (Signed)
Patient called last night and left a message to a call back regarding her estradiol cream. Patient called this morning regarding the dose of the cream. Explained that the nurse will call her back

## 2020-02-23 NOTE — Telephone Encounter (Signed)
Told ms Donner that she is to fill the applicator to 1 gram. She can use her finger to apply some on the outside of the vagina and then insert her finger into the vagina and swipe the cream and let it melt.  She can use the applicator once the irritation has decreased.  Pt verbalized understanding.

## 2020-04-24 ENCOUNTER — Ambulatory Visit: Payer: Self-pay | Admitting: Radiation Oncology

## 2020-04-29 IMAGING — MG DIGITAL SCREENING BILATERAL MAMMOGRAM WITH TOMO AND CAD
8 series · 8 of 24 positions shown · non-contrast
Comparison: Previous exam(s).

CLINICAL DATA: Screening.

EXAM:
DIGITAL SCREENING BILATERAL MAMMOGRAM WITH TOMO AND CAD

[L CC synth-2D]
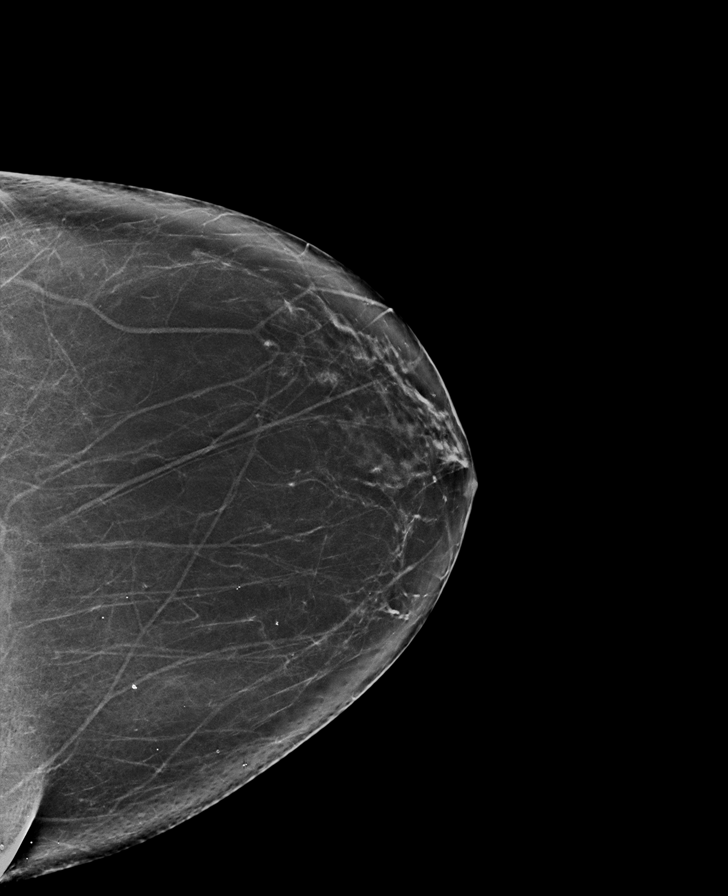

[R MLO synth-2D]
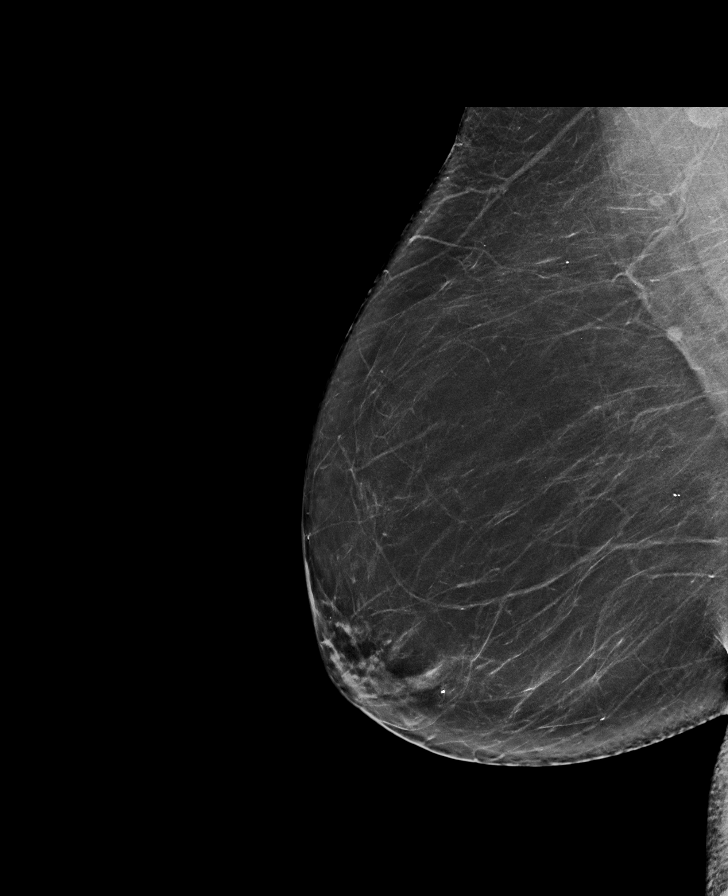

[R CC synth-2D]
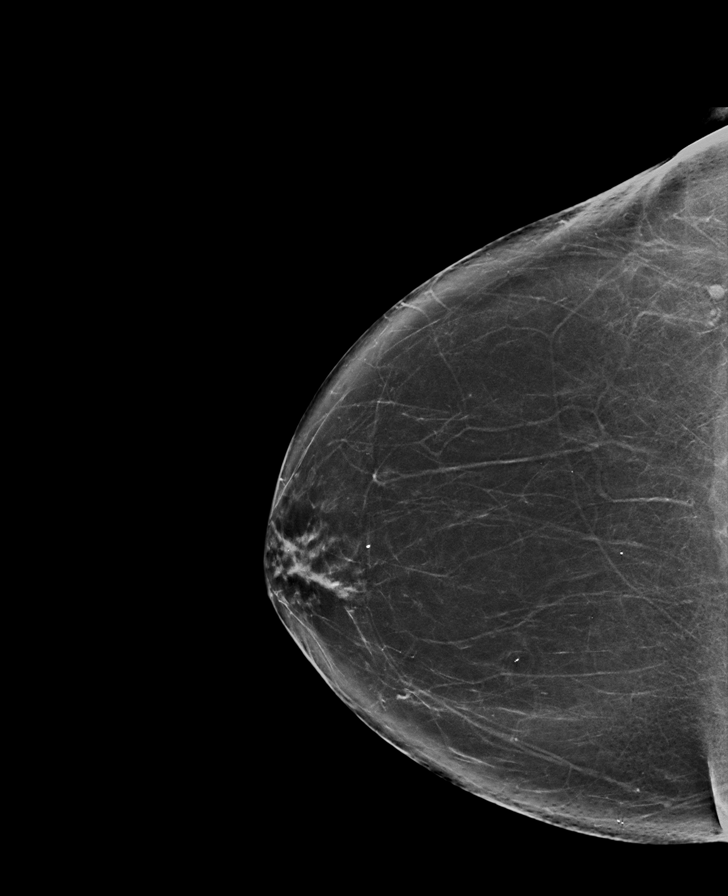

[L MLO synth-2D]
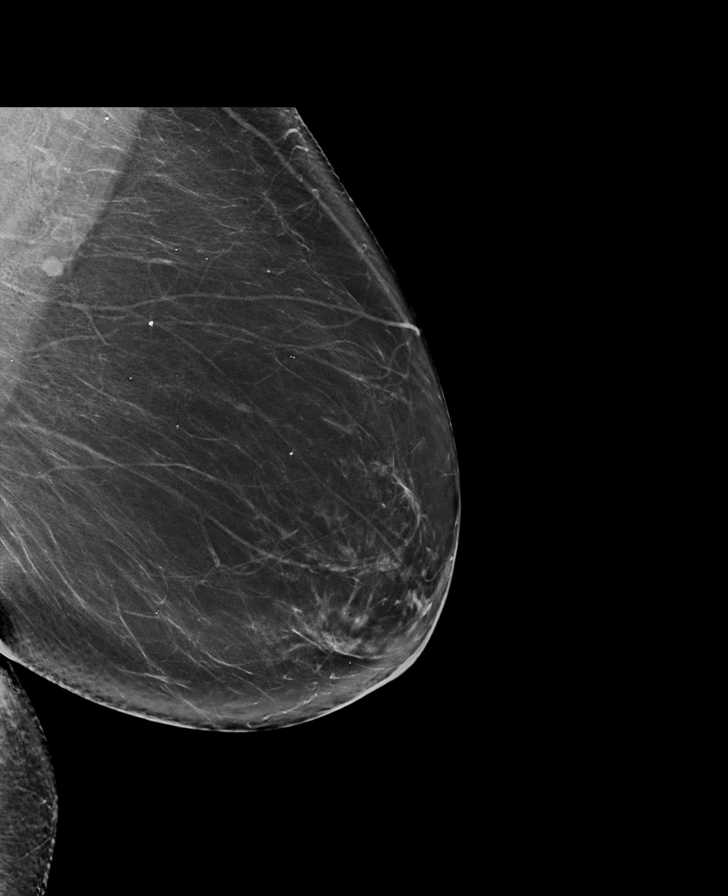

[L CC tomo · tomo slice 37/73.0]
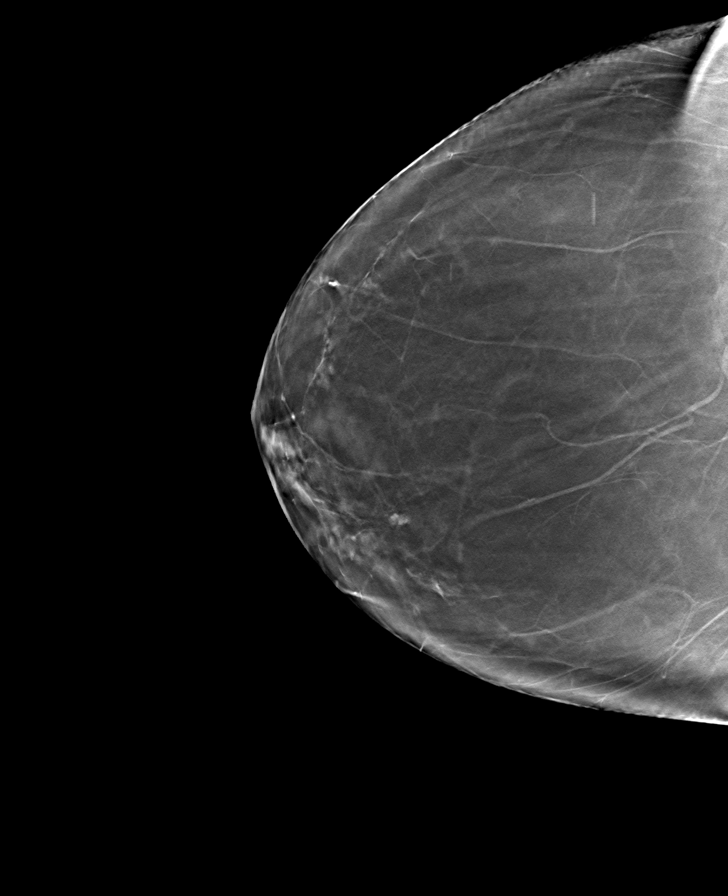

[L MLO tomo · tomo slice 41/80.0]
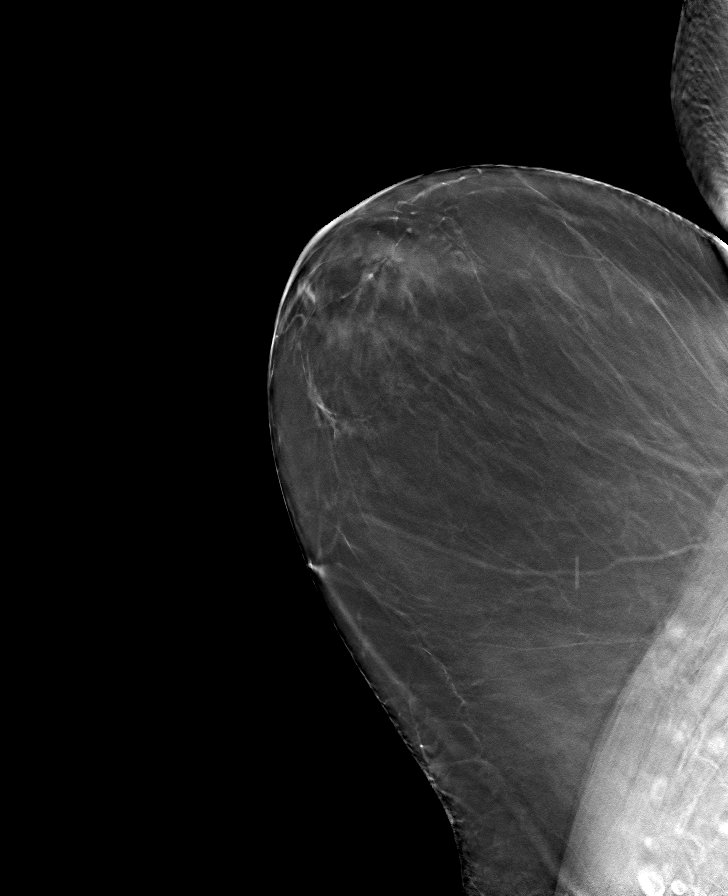

[R CC tomo · tomo slice 37/74.0]
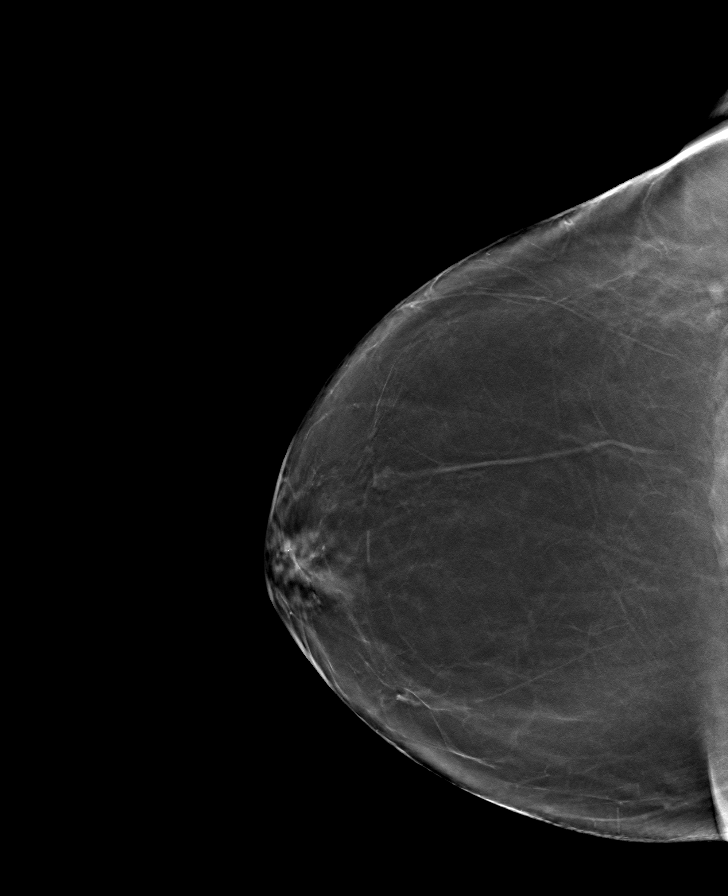

[R MLO tomo · tomo slice 39/76.0]
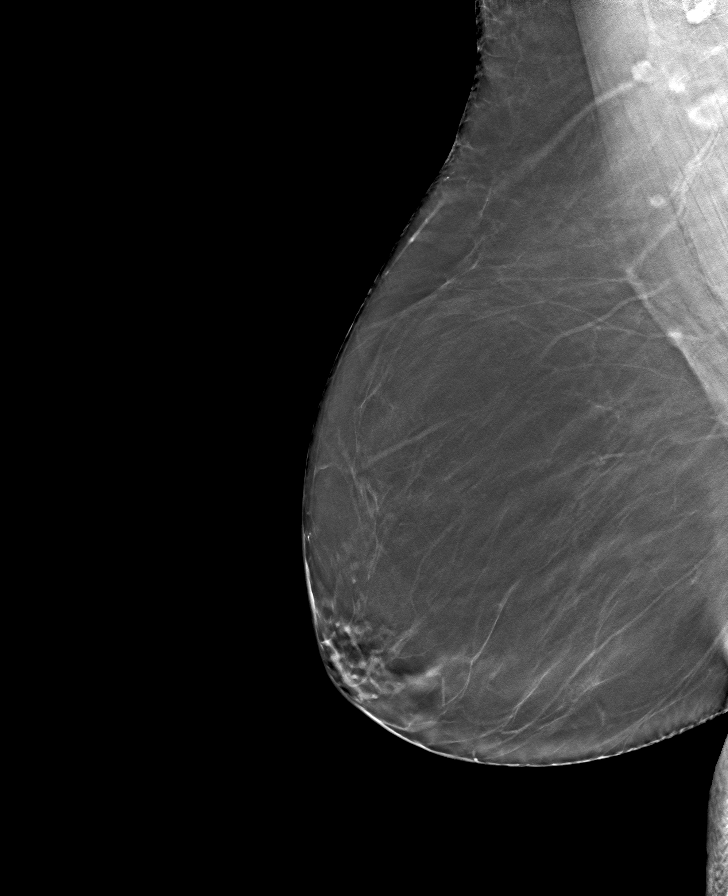

[8 of 24 positions shown; findings below may reference images not displayed]

ACR Breast Density Category b: There are scattered areas of
fibroglandular density.
FINDINGS: There are no findings suspicious for malignancy. Images were
processed with CAD.
IMPRESSION: No mammographic evidence of malignancy. A result letter of this
screening mammogram will be mailed directly to the patient.

RECOMMENDATION:
Screening mammogram in one year. (Code:[id number])

BI-RADS CATEGORY  1: Negative.

## 2020-05-01 ENCOUNTER — Ambulatory Visit: Payer: Medicare Other | Admitting: Radiation Oncology

## 2020-05-03 NOTE — Progress Notes (Signed)
Radiation Oncology         [phone] ________________________________  Name: Nicole Brennan MRN: [id number]  Date: 05/04/2020  DOB: 07-05-1940  Follow-Up Visit Note  CC: Copland, Gay Filler, MD  Copland, Gay Filler, MD    ICD-10-CM   1. Endometrial carcinoma (HCC)  C54.1     Diagnosis:  FIGOStage I-B, grade 1endometrioid adenocarcinoma, pT1b, pN0  Interval Since Last Radiation: One year, six months, and four days  09/30/2018, 10/07/2018, 10/14/2018, 10/21/2018, 10/28/2018: Vaginal Cuff (Brachytherapy) / 30 Gy in 5 fractions  Narrative:  The patient returns today for routine follow-up. She is doing well overall. She was last seen by Dr. Denman George on 01/28/2020, at which time there was no evidence of disease.  On review of systems, she reports using her dilator once a week.  She denies any bleeding after use or pain.. She denies pelvic pain abdominal bloating hematuria or rectal bleeding.  She denies any problems with loose bowels or diarrhea..  ALLERGIES:  is allergic to penicillins.  Meds: Current Outpatient Medications  Medication Sig Dispense Refill   Ascorbic Acid (VITAMIN C PO) Take by mouth.     Calcium-Phosphorus-Vitamin D [phone] MG-MG-UNIT CHEW Chew 2 tablets by mouth at bedtime.      carboxymethylcellul-glycerin (REFRESH OPTIVE) 0.5-0.9 % ophthalmic solution Place 1 drop into both eyes 2 (two) times daily as needed for dry eyes.     estradiol (ESTRACE VAGINAL) 0.1 MG/GM vaginal cream Place 1 Applicatorful vaginally 3 (three) times a week. 42.5 g 3   levothyroxine (SYNTHROID) 75 MCG tablet Take 1 tablet (75 mcg total) by mouth daily. 90 tablet 3   Multiple Vitamins-Minerals (MULTIVITAMIN WITH MINERALS) tablet Take 1 tablet by mouth every evening. Centrum Silver     acetaminophen (TYLENOL) 500 MG tablet Take 1,000 mg by mouth every 6 (six) hours as needed for headache.  (Patient not taking: Reported on 05/04/2020)     Lubricants (ASTROGLIDE) GEL Apply 1  applicator topically as needed. (Patient not taking: Reported on 05/04/2020) 66.5 g 3   No current facility-administered medications for this encounter.    Physical Findings: The patient is in no acute distress. Patient is alert and oriented.  height is 5' 5.5" (1.664 m) and weight is 192 lb 3.2 oz (87.2 kg). Her temperature is 98.2 F (36.8 C). Her blood pressure is 141/52 (abnormal) and her pulse is 74. Her respiration is 20 and oxygen saturation is 98%.  No significant changes. Lungs are clear to auscultation bilaterally. Heart has regular rate and rhythm. No palpable cervical, supraclavicular, or axillary adenopathy. Abdomen soft, non-tender, normal bowel sounds. On pelvic examination the external genitalia were unremarkable. A speculum exam was performed. There are no mucosal lesions noted in the vaginal vault. On bimanual  examination there were no pelvic masses appreciated. Vaginal cuff intact.   Lab Findings: Lab Results  Component Value Date   WBC 4.4 09/02/2019   HGB 13.0 09/02/2019   HCT 39.3 09/02/2019   MCV 98.8 09/02/2019   PLT 323.0 09/02/2019    Radiographic Findings: No results found.  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   Plan: The patient will follow up with Dr. Denman George on 07/28/2020 and with radiation oncology in six months.  After this appointment and the patient will then proceed to every 6 months follow-up.  Total time spent in this encounter was 20 minutes which included reviewing the patient's most recent follow-up with Dr. Denman George,  physical examination, and documentation.  ____________________________________   Blair Promise, PhD, MD  This document serves as a record of services personally performed by Gery Pray, MD. It was created on his behalf by Clerance Lav, a trained medical scribe. The creation of this record is based on the scribe's personal observations and the  provider's statements to them. This document has been checked and approved by the attending provider.

## 2020-05-04 ENCOUNTER — Other Ambulatory Visit: Payer: Self-pay

## 2020-05-04 ENCOUNTER — Encounter: Payer: Self-pay | Admitting: *Deleted

## 2020-05-04 ENCOUNTER — Ambulatory Visit
Admission: RE | Admit: 2020-05-04 | Discharge: 2020-05-04 | Disposition: A | Discharge status: Home / Self Care | Payer: Medicare Other | Source: Ambulatory Visit | Attending: Radiation Oncology | Admitting: Radiation Oncology

## 2020-05-04 DIAGNOSIS — Z79899 Other long term (current) drug therapy: Secondary | ICD-10-CM | POA: Diagnosis not present

## 2020-05-04 DIAGNOSIS — C541 Malignant neoplasm of endometrium: Secondary | ICD-10-CM

## 2020-05-04 DIAGNOSIS — Z8542 Personal history of malignant neoplasm of other parts of uterus: Secondary | ICD-10-CM | POA: Diagnosis not present

## 2020-05-04 DIAGNOSIS — Z923 Personal history of irradiation: Secondary | ICD-10-CM | POA: Diagnosis not present

## 2020-05-04 DIAGNOSIS — Z08 Encounter for follow-up examination after completed treatment for malignant neoplasm: Secondary | ICD-10-CM | POA: Diagnosis not present

## 2020-05-04 HISTORY — DX: Personal history of irradiation: Z92.3

## 2020-05-04 NOTE — Progress Notes (Addendum)
Patient is here today following radiation to endometrium completed 10/2018.  She denies having any pain.  Denies having any diarrhea/constipation/nausea/vomiting.  Denies any bladder concerns.  Reports she has to urinate during the night twice.  She reports she drinks fluid during the night so that is why she urinates.  Denies any vaginal discharge.  Reports she uses the vaginal dilator once a week most weeks.  Is sexually intimate with spouse but does not have sexual intercourse.  Patient wants clarification on how long she should use the vaginal dilator.     Vitals:   05/04/20 0840  BP: (!) 141/52  Pulse: 74  Resp: 20  Temp: 98.2 F (36.8 C)  SpO2: 98%  Weight: 192 lb 3.2 oz (87.2 kg)  Height: 5' 5.5" (1.664 m)

## 2020-06-08 ENCOUNTER — Encounter: Payer: Self-pay | Admitting: Family Medicine

## 2020-06-08 DIAGNOSIS — E038 Other specified hypothyroidism: Secondary | ICD-10-CM

## 2020-06-08 MED ORDER — LEVOTHYROXINE SODIUM 75 MCG PO TABS: 75.0000 ug | ORAL_TABLET | Freq: Every day | ORAL | 3 refills | Status: DC

## 2020-06-12 ENCOUNTER — Other Ambulatory Visit (HOSPITAL_BASED_OUTPATIENT_CLINIC_OR_DEPARTMENT_OTHER): Payer: Self-pay | Admitting: Family Medicine

## 2020-06-12 DIAGNOSIS — Z1231 Encounter for screening mammogram for malignant neoplasm of breast: Secondary | ICD-10-CM

## 2020-06-19 DIAGNOSIS — M533 Sacrococcygeal disorders, not elsewhere classified: Secondary | ICD-10-CM | POA: Diagnosis not present

## 2020-07-11 DIAGNOSIS — M533 Sacrococcygeal disorders, not elsewhere classified: Secondary | ICD-10-CM | POA: Diagnosis not present

## 2020-07-27 ENCOUNTER — Encounter: Payer: Self-pay | Admitting: Gynecologic Oncology

## 2020-07-27 DIAGNOSIS — H16223 Keratoconjunctivitis sicca, not specified as Sjogren's, bilateral: Secondary | ICD-10-CM | POA: Diagnosis not present

## 2020-07-27 NOTE — Progress Notes (Signed)
[name]  Chief Complaint:  Chief Complaint  Patient presents with  . Endometrial carcinoma [facility])    Assessment/Plan:  Nicole. Nicole Brennan  is a 80 y.o.  year old with stage IB grade 1 endometrial cancer.  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 S/p adjuvant radiation (vaginal brachytherapy) - completed 10/28/18.  No evidence of disease.   I recommend she follow-up at 3 monthly intervals for symptom review, physical examination and pelvic examination until August, 2022. After 2 years we will space these visits to every 6 months, and then annually if recurrence has not developed within 5 years. All questions were answered.   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 The patient's history began in May 2020 when she began experiencing vaginal spotting and was seen by Dr. Murrell Redden performed a transvaginal ultrasound scan which showed a thickened endometrium which then prompted an endometrial biopsy which was performed on Jul 27, 2018.  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  On August 20, 2018 she underwent a robotic assisted total hysterectomy, BSO sentinel lymph node biopsy.  Intraoperative findings were unremarkable for extrauterine disease.  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  Sentinel lymph nodes were negative for metastatic disease.  The cervix, and adnexa are also negative for metastatic disease.  There was no lymphovascular or perineural invasion seen.  Due to her deep myometrial invasion and at her age she met criteria for intermediate to high risk  for recurrence.  She was recommended adjuvant radiation to the vaginal cuff in accordance with NCCN guidelines.  Interval Hx:  She received vaginal cuff brachytherapy (30 Pearline Cables in 5 fractions) between the dates of July 22 and October 28, 2018.  She tolerated treatment well.  She has no symptoms concerning for recurrence.     Current Meds:  Outpatient Encounter Medications as of 07/28/2020  Medication Sig  . acetaminophen (TYLENOL) 500 MG tablet Take 1,000 mg by mouth every 6 (six) hours as needed for headache.  . Ascorbic Acid (VITAMIN C PO) Take by mouth.  . Calcium-Phosphorus-Vitamin D [phone] MG-MG-UNIT CHEW Chew 2 tablets by mouth at bedtime.   . carboxymethylcellul-glycerin (REFRESH OPTIVE) 0.5-0.9 % ophthalmic solution Place 1 drop into both eyes 2 (two) times daily as needed for dry eyes.  Marland Kitchen levothyroxine (SYNTHROID) 75 MCG tablet Take 1 tablet (75 mcg total) by mouth daily.  . Lubricants (ASTROGLIDE) GEL Apply 1 applicator topically as needed.  . Multiple Vitamins-Minerals (MULTIVITAMIN WITH MINERALS) tablet Take 1 tablet by mouth every evening. Centrum Silver  . estradiol (ESTRACE) 0.1 MG/GM vaginal cream PLACE 1 APPLICATORFUL VAGINALLY 3 TIMES A WEEK (Patient not taking: Reported on 07/27/2020)   No facility-administered encounter medications on file as of 07/28/2020.    Allergy:  Allergies  Allergen Reactions  . Penicillins Hives    Did it involve swelling of the face/tongue/throat, SOB, or low BP? No Did it involve sudden or severe rash/hives, skin peeling, or any reaction on the inside of your mouth or nose? Unknown Did you need to seek medical attention at a hospital or doctor's office? Yes When did it last happen?Childhood reaction  If all above answers are "NO", may proceed with cephalosporin use.     Social Hx:   Social History   Socioeconomic History  . Marital status: Married    Spouse name: Not on file  . Number of children: Not on file  . Years of  education: Not on file  . Highest education level: Not on file  Occupational History  . Not on file  Tobacco Use  . Smoking status: Never Smoker  . Smokeless tobacco: Never Used  Vaping Use  . Vaping Use: Never used  Substance and Sexual Activity  . Alcohol use: No  . Drug use: No  . Sexual activity: Yes  Other Topics Concern  . Not on file  Social History Narrative  . Not on file   Social Determinants of Health   Financial Resource Strain: Not on file  Food Insecurity: Not on file  Transportation Needs: Not on file  Physical Activity: Not on file  Stress: Not on file  Social Connections: Not on file  Intimate Partner Violence: Not on file    Past Surgical Hx:  Past Surgical History:  Procedure Laterality Date  . DG  BONE DENSITY ([place] HX)    . KNEE SURGERY    . ROBOTIC ASSISTED TOTAL HYSTERECTOMY WITH BILATERAL SALPINGO OOPHERECTOMY Bilateral 08/20/2018   Procedure: XI ROBOTIC ASSISTED TOTAL HYSTERECTOMY WITH BILATERAL SALPINGO OOPHORECTOMY;  Surgeon: Everitt Amber, MD;  Location: [facility];  Service: Gynecology;  Laterality: Bilateral;  . SENTINEL NODE BIOPSY N/A 08/20/2018   Procedure: SENTINEL LYMPH  NODE BIOPSY;  Surgeon: Everitt Amber, MD;  Location: [facility];  Service: Gynecology;  Laterality: N/A;  . TUBAL LIGATION      Past Medical Hx:  Past Medical History:  Diagnosis Date  . Allergy   . Arthritis   . Cataract   . History of radiation therapy 09/30/2018-10/28/2018   Vaginal cuff HDR; Dr. Gery Pray  . Thyroid disease     Past Gynecological History:  See HPI No LMP recorded. Patient has had a hysterectomy.  Family Hx:  Family History  Problem Relation Age of Onset  . Vascular Disease Mother   . Colon cancer Mother   . Dementia Father     Review of Systems:  Constitutional  Feels well,    ENT Normal appearing ears and nares bilaterally Skin/Breast  No rash, sores, jaundice, itching, dryness Cardiovascular  No chest pain, shortness of breath, or  edema  Pulmonary  No cough or wheeze.  Gastro Intestinal  No nausea, vomitting, or diarrhoea. No bright red blood per rectum, no abdominal pain, change in bowel movement, or constipation.  Genito Urinary  No frequency, urgency, dysuria,no bleeding Musculo Skeletal  No myalgia, arthralgia, joint swelling or pain  Neurologic  No weakness, numbness, change in gait,  Psychology  No depression, anxiety, insomnia.   Vitals:  Blood pressure (!) 133/57, pulse 68, temperature (!) 97.5 F (36.4 C), temperature source Tympanic, resp. rate 16, height 5' 3.5" (1.613 m), weight 189 lb 8 oz (86 kg), SpO2 97 %.  Physical Exam: WD in NAD Neck  Supple NROM, without any enlargements.  Lymph Node Survey No cervical supraclavicular or inguinal adenopathy Cardiovascular  Pulse normal rate, regularity and rhythm. S1 and S2 normal.  Lungs  Clear to auscultation bilateraly, without wheezes/crackles/rhonchi. Good air movement.  Skin  No rash/lesions/breakdown  Psychiatry  Alert and oriented to person, place, and time  Abdomen  Normoactive bowel sounds, abdomen soft, non-tender and mildly obese without evidence of  hernia.  Back No CVA tenderness Genito Urinary  Vulva/vagina: Lichen sclerosis distribution around vulvar - particularly labia majora, circumferential.   Bladder/urethra:  No lesions or masses, well supported bladder  Vaginal cuff: smooth, in tact, no lesions or masses. Exam poorly tolerated, therefore limited evaluation.   Cervix and uterus: surgically absent  Adnexa: no palpable masses. Rectal  deferred Extremities  No bilateral cyanosis, clubbing or edema.   Thereasa Solo, MD  07/28/2020, 1:13 PM

## 2020-07-28 ENCOUNTER — Inpatient Hospital Stay: Payer: Medicare Other | Attending: Gynecologic Oncology | Admitting: Gynecologic Oncology

## 2020-07-28 ENCOUNTER — Ambulatory Visit (HOSPITAL_BASED_OUTPATIENT_CLINIC_OR_DEPARTMENT_OTHER): Payer: Medicare Other

## 2020-07-28 ENCOUNTER — Other Ambulatory Visit: Payer: Self-pay

## 2020-07-28 ENCOUNTER — Telehealth: Payer: Self-pay

## 2020-07-28 VITALS — BP 133/57 | HR 68 | Temp 97.5°F | Resp 16 | Ht 63.5 in | Wt 189.5 lb

## 2020-07-28 DIAGNOSIS — Z923 Personal history of irradiation: Secondary | ICD-10-CM | POA: Diagnosis not present

## 2020-07-28 DIAGNOSIS — Z90710 Acquired absence of both cervix and uterus: Secondary | ICD-10-CM | POA: Diagnosis not present

## 2020-07-28 DIAGNOSIS — Z8543 Personal history of malignant neoplasm of ovary: Secondary | ICD-10-CM | POA: Insufficient documentation

## 2020-07-28 DIAGNOSIS — Z90722 Acquired absence of ovaries, bilateral: Secondary | ICD-10-CM | POA: Insufficient documentation

## 2020-07-28 DIAGNOSIS — C541 Malignant neoplasm of endometrium: Secondary | ICD-10-CM

## 2020-07-28 NOTE — Patient Instructions (Signed)
Please notify Dr Denman George at phone number [phone] if you notice vaginal bleeding, new pelvic or abdominal pains, bloating, feeling full easy, or a change in bladder or bowel function.   Please have Dr Clabe Seal office contact Dr Serita Grit office (at [phone]) in August after your appointment with him to request an appointment with Dr Denman George for January, 2023.

## 2020-07-28 NOTE — Telephone Encounter (Signed)
Nicole Brennan called in after her appointment. She wanted to know if she could stop using her dilator. Dr. Denman George notified and said "Yes, stop using the dilator." Patient verbalized understanding, will call with any questions or concerns.

## 2020-08-01 ENCOUNTER — Encounter (HOSPITAL_BASED_OUTPATIENT_CLINIC_OR_DEPARTMENT_OTHER): Payer: Self-pay

## 2020-08-01 ENCOUNTER — Other Ambulatory Visit: Payer: Self-pay

## 2020-08-01 ENCOUNTER — Ambulatory Visit (HOSPITAL_BASED_OUTPATIENT_CLINIC_OR_DEPARTMENT_OTHER)
Admission: RE | Admit: 2020-08-01 | Discharge: 2020-08-01 | Disposition: A | Discharge status: Home / Self Care | Payer: Medicare Other | Source: Ambulatory Visit | Attending: Family Medicine | Admitting: Family Medicine

## 2020-08-01 DIAGNOSIS — Z1231 Encounter for screening mammogram for malignant neoplasm of breast: Secondary | ICD-10-CM | POA: Insufficient documentation

## 2020-08-10 ENCOUNTER — Other Ambulatory Visit: Payer: Self-pay

## 2020-08-10 ENCOUNTER — Ambulatory Visit (INDEPENDENT_AMBULATORY_CARE_PROVIDER_SITE_OTHER): Payer: Medicare Other | Admitting: Family Medicine

## 2020-08-10 ENCOUNTER — Encounter: Payer: Self-pay | Admitting: Family Medicine

## 2020-08-10 VITALS — BP 122/82 | HR 81 | Temp 98.1°F | Resp 16 | Ht 63.5 in | Wt 187.0 lb

## 2020-08-10 DIAGNOSIS — Z23 Encounter for immunization: Secondary | ICD-10-CM

## 2020-08-10 DIAGNOSIS — S0180XA Unspecified open wound of other part of head, initial encounter: Secondary | ICD-10-CM

## 2020-08-10 DIAGNOSIS — L255 Unspecified contact dermatitis due to plants, except food: Secondary | ICD-10-CM | POA: Diagnosis not present

## 2020-08-10 MED ORDER — PREDNISONE 20 MG PO TABS: ORAL_TABLET | ORAL | 0 refills | Status: DC

## 2020-08-10 NOTE — Progress Notes (Signed)
[place] [place] [facility] [employer] [place], [place], [place] [address] [phone]  Date:  08/10/2020   Name:  Nicole Brennan   DOB:  06/23/1940   MRN:  [id number]  PCP:  Darreld Mclean, MD    Chief Complaint: Poison Ivy (Red, itching, Rash on face, hands, happened a week ago working in yard)   History of Present Illness:  Nicole Brennan is a 80 y.o. very pleasant female patient who presents with the following:  Here today with concern of rash- possible PI She has noted sx for about a week Rash is on her hands and arms-seems to have been exposed to poison ivy while in her yard She was doing some work outside- she was pulling some vines off bushes at home and was not wearing gloves  She tends to react more strongly to PI over the last few years   Right now she is using witch hazel, neosporin, calomine lotion Nothing is getting rid of her itching   She saw Dr Denman George for her endometrial cancer a couple of years ago- nothing needed at this time, follow-up as planned  She did not get shingrix due to expense- she did get zostavax years ago  Due for a tetanus booster I encouraged covid booster but not done yet    Patient Active Problem List   Diagnosis Date Noted  . Lichen sclerosus 56/43/3295  . Endometrial carcinoma ([place]) 08/05/2018  . Obesity (BMI 30.0-34.9) 08/05/2018  . Seasonal allergies 04/21/2012  . Cataract 04/21/2012  . Osteoporosis 05/01/2011  . Hypothyroidism 04/30/2011  . Osteoarthritis 04/30/2011    Past Medical History:  Diagnosis Date  . Allergy   . Arthritis   . Cataract   . History of radiation therapy 09/30/2018-10/28/2018   Vaginal cuff HDR; Dr. Gery Pray  . Thyroid disease     Past Surgical History:  Procedure Laterality Date  . DG  BONE DENSITY ([place] HX)    . KNEE SURGERY    . ROBOTIC ASSISTED TOTAL HYSTERECTOMY WITH BILATERAL SALPINGO OOPHERECTOMY Bilateral 08/20/2018   Procedure: XI ROBOTIC  ASSISTED TOTAL HYSTERECTOMY WITH BILATERAL SALPINGO OOPHORECTOMY;  Surgeon: Everitt Amber, MD;  Location: [facility];  Service: Gynecology;  Laterality: Bilateral;  . SENTINEL NODE BIOPSY N/A 08/20/2018   Procedure: SENTINEL LYMPH  NODE BIOPSY;  Surgeon: Everitt Amber, MD;  Location: [facility];  Service: Gynecology;  Laterality: N/A;  . TUBAL LIGATION      Social History   Tobacco Use  . Smoking status: Never Smoker  . Smokeless tobacco: Never Used  Vaping Use  . Vaping Use: Never used  Substance Use Topics  . Alcohol use: No  . Drug use: No    Family History  Problem Relation Age of Onset  . Vascular Disease Mother   . Colon cancer Mother   . Dementia Father     Allergies  Allergen Reactions  . Penicillins Hives    Did it involve swelling of the face/tongue/throat, SOB, or low BP? No Did it involve sudden or severe rash/hives, skin peeling, or any reaction on the inside of your mouth or nose? Unknown Did you need to seek medical attention at a hospital or doctor's office? Yes When did it last happen?Childhood reaction If all above answers are "NO", may proceed with cephalosporin use.     Medication list has been reviewed and updated.  Current Outpatient Medications on File Prior to Visit  Medication Sig Dispense  Refill  . acetaminophen (TYLENOL) 500 MG tablet Take 1,000 mg by mouth every 6 (six) hours as needed for headache.    . Ascorbic Acid (VITAMIN C PO) Take by mouth.    . Calcium-Phosphorus-Vitamin D [phone] MG-MG-UNIT CHEW Chew 2 tablets by mouth at bedtime.     . carboxymethylcellul-glycerin (REFRESH OPTIVE) 0.5-0.9 % ophthalmic solution Place 1 drop into both eyes 2 (two) times daily as needed for dry eyes.    Marland Kitchen estradiol (ESTRACE) 0.1 MG/GM vaginal cream PLACE 1 APPLICATORFUL VAGINALLY 3 TIMES A WEEK 42.5 g 3  . levothyroxine (SYNTHROID) 75 MCG tablet Take 1 tablet (75 mcg total) by mouth daily. 90 tablet 3  . Lubricants (ASTROGLIDE) GEL Apply 1 applicator topically  as needed. 66.5 g 3  . Multiple Vitamins-Minerals (MULTIVITAMIN WITH MINERALS) tablet Take 1 tablet by mouth every evening. Centrum Silver     No current facility-administered medications on file prior to visit.    Review of Systems:  As per HPI- otherwise negative.   Physical Examination: Vitals:   08/10/20 1001  BP: 122/82  Pulse: 81  Resp: 16  Temp: 98.1 F (36.7 C)  SpO2: 97%   Vitals:   08/10/20 1001  Weight: 187 lb (84.8 kg)  Height: 5' 3.5" (1.613 m)   Body mass index is 32.61 kg/m. Ideal Body Weight: Weight in (lb) to have BMI = 25: 143.1  GEN: no acute distress.  Mildly obese, otherwise looks well HEENT: Atraumatic, Normocephalic.  Ears and Nose: No external deformity. CV: RRR, No M/G/R. No JVD. No thrill. No extra heart sounds. PULM: CTA B, no wheezes, crackles, rhonchi. No retractions. No resp. distress. No accessory muscle use. ABD: S, NT, ND, +BS. No rebound. No HSM. EXTR: No c/c/e PSYCH: Normally interactive. Conversant.  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  There is some skin breakdown of the skin on her face, no particular wound care is needed but will update tetanus   Assessment and Plan: Rhus dermatitis - Plan: predniSONE (DELTASONE) 20 MG tablet  Immunization due - Plan: Td vaccine greater than or equal to 7yo preservative free IM  Open wound of face, initial encounter - Plan: Td vaccine greater than or equal to 7yo preservative free IM  Patient today with poison ivy exposure.  She is having significant symptoms which are quite troublesome for her.  We will have her start on prednisone for 10 days, can continue topical agents as needed  Update tetanus today  She is asked to alert me if not feeling better in the next few days, sooner if worse This visit occurred during the SARS-CoV-2 public health emergency.  Safety protocols were in place, including screening questions prior to the visit,  additional usage of staff PPE, and extensive cleaning of exam room while observing appropriate contact time as indicated for disinfecting solutions.     Signed Lamar Blinks, MD

## 2020-08-10 NOTE — Patient Instructions (Addendum)
I will see you in July for your physical We will treat you with prednisone for 10 days for your poison ivy- let me know if not getting better in the next few days Tetanus booster today

## 2020-08-28 DIAGNOSIS — M25562 Pain in left knee: Secondary | ICD-10-CM | POA: Diagnosis not present

## 2020-09-04 ENCOUNTER — Ambulatory Visit: Payer: Medicare Other | Admitting: Family Medicine

## 2020-09-10 NOTE — Progress Notes (Addendum)
[place] at [facility] [address] [place], [place] [address] [phone]  Date:  09/18/2020   Name:  Nicole Brennan   DOB:  [birth year]   MRN:  [id number]  PCP:  Darreld Mclean, MD    Chief Complaint: Yearly Follow up   History of Present Illness:  Nicole Brennan is a 80 y.o. very pleasant female patient who presents with the following:  Pt seen today for annual follow-up visit- Medicare Last seen by myself last month for PI rash- History of hypothyroidism, osteoporosis, endometrial cancer dx 07/2018 She saw Dr Denman George recently and all was ok - she is still being seen every 6 months by her gynecologic oncologist as well as her radiologist  Covid boosters not done yet.  I did encourage her to get these done Flu UTD Dexa one year ago Mammo UTD Cologuard last year  Labs one year ago  Did not get shingrix due to expense but she did get zostavax   Synthroid 75 mcg  She is working a good bit again   She has noted some recent sinus sx- seemed to get better, at this time she does not feel that she needs any particular treatment or evaluation  Patient Active Problem List   Diagnosis Date Noted   Lichen sclerosus 38/18/2993   Endometrial carcinoma ([place]) 08/05/2018   Obesity (BMI 30.0-34.9) 08/05/2018   Seasonal allergies 04/21/2012   Cataract 04/21/2012   Osteoporosis 05/01/2011   Hypothyroidism 04/30/2011   Osteoarthritis 04/30/2011    Past Medical History:  Diagnosis Date   Allergy    Arthritis    Cataract    History of radiation therapy 09/30/2018-10/28/2018   Vaginal cuff HDR; Dr. Gery Pray   Thyroid disease     Past Surgical History:  Procedure Laterality Date   DG  BONE DENSITY ([place] HX)     KNEE SURGERY     ROBOTIC ASSISTED TOTAL HYSTERECTOMY WITH BILATERAL SALPINGO OOPHERECTOMY Bilateral 08/20/2018   Procedure: XI ROBOTIC ASSISTED TOTAL HYSTERECTOMY WITH BILATERAL SALPINGO OOPHORECTOMY;  Surgeon: Everitt Amber, MD;  Location: [facility];  Service: Gynecology;  Laterality: Bilateral;   SENTINEL NODE BIOPSY N/A 08/20/2018   Procedure: SENTINEL LYMPH  NODE BIOPSY;  Surgeon: Everitt Amber, MD;  [facility]: [facility];  Service: Gynecology;  Laterality: N/A;   TUBAL LIGATION      Social History   Tobacco Use   Smoking status: Never   Smokeless tobacco: Never  Vaping Use   Vaping Use: Never used  Substance Use Topics   Alcohol use: No   Drug use: No    Family History  Problem Relation Age of Onset   Vascular Disease Mother    Colon cancer Mother    Dementia Father     Allergies  Allergen Reactions   Penicillins Hives    Did it involve swelling of the face/tongue/throat, SOB, or low BP? No Did it involve sudden or severe rash/hives, skin peeling, or any reaction on the inside of your mouth or nose? Unknown Did you need to seek medical attention at a hospital or doctor's office? Yes When did it last happen?Childhood reaction If all above answers are "NO", may proceed with cephalosporin use.     Medication list has been reviewed and updated.  Current Outpatient Medications on File Prior to Visit  Medication Sig Dispense Refill   acetaminophen (TYLENOL) 500 MG tablet Take 1,000 mg by mouth every 6 (six) hours as  needed for headache.     Ascorbic Acid (VITAMIN C PO) Take by mouth.     Calcium-Phosphorus-Vitamin D [phone] MG-MG-UNIT CHEW Chew 2 tablets by mouth at bedtime.      carboxymethylcellul-glycerin (REFRESH OPTIVE) 0.5-0.9 % ophthalmic solution Place 1 drop into both eyes 2 (two) times daily as needed for dry eyes.     estradiol (ESTRACE) 0.1 MG/GM vaginal cream PLACE 1 APPLICATORFUL VAGINALLY 3 TIMES A WEEK 42.5 g 3   levothyroxine (SYNTHROID) 75 MCG tablet Take 1 tablet (75 mcg total) by mouth daily. 90 tablet 3   Lubricants (ASTROGLIDE) GEL Apply 1 applicator topically as needed. 66.5 g 3   Multiple Vitamins-Minerals (MULTIVITAMIN WITH MINERALS) tablet Take 1 tablet by mouth every  evening. Centrum Silver     No current facility-administered medications on file prior to visit.    Review of Systems:  As per HPI- otherwise negative.   Physical Examination: Vitals:   09/18/20 0919  BP: 122/74  Pulse: 68  Resp: 15  Temp: 98.1 F (36.7 C)  SpO2: 99%   Vitals:   09/18/20 0919  Weight: 189 lb (85.7 kg)  Height: 5' 3.6" (1.615 m)   Body mass index is 32.85 kg/m. Ideal Body Weight: Weight in (lb) to have BMI = 25: 143.5  GEN: no acute distress. HEENT: Atraumatic, Normocephalic.  Ears and Nose: No external deformity. CV: RRR, No M/G/R. No JVD. No thrill. No extra heart sounds. PULM: CTA B, no wheezes, crackles, rhonchi. No retractions. No resp. distress. No accessory muscle use. ABD: S, NT, ND, +BS. No rebound. No HSM. EXTR: No c/c/e PSYCH: Normally interactive. Conversant.    Assessment and Plan: Other specified hypothyroidism - Plan: TSH  Screening for deficiency anemia - Plan: CBC  Screening for hyperlipidemia - Plan: Lipid panel  Screening for diabetes mellitus - Plan: Comprehensive metabolic panel, Hemoglobin A1c  Elevated glucose - Plan: Hemoglobin A1c  Fatigue, unspecified type - Plan: CBC  Here today for follow-up visit.  Encouraged healthy diet and exercise routine, encouraged COVID-19 booster Will plan further follow- up pending labs.  This visit occurred during the SARS-CoV-2 public health emergency.  Safety protocols were in place, including screening questions prior to the visit, additional usage of staff PPE, and extensive cleaning of exam room while observing appropriate contact time as indicated for disinfecting solutions.   Signed Lamar Blinks, MD  Addendum 7/12, received labs as below-message to patient  Results for orders placed or performed in visit on 09/18/20  CBC  Result Value Ref Range   WBC 5.3 4.0 - 10.5 K/uL   RBC 3.84 (L) 3.87 - 5.11 Mil/uL   Platelets 358.0 150.0 - 400.0 K/uL   Hemoglobin 12.5 12.0 - 15.0  g/dL   HCT 37.1 36.0 - 46.0 %   MCV 96.8 78.0 - 100.0 fl   MCHC 33.7 30.0 - 36.0 g/dL   RDW 13.5 11.5 - 15.5 %  Comprehensive metabolic panel  Result Value Ref Range   Sodium 142 135 - 145 mEq/L   Potassium 4.3 3.5 - 5.1 mEq/L   Chloride 106 96 - 112 mEq/L   CO2 26 19 - 32 mEq/L   Glucose, Bld 82 70 - 99 mg/dL   BUN 14 6 - 23 mg/dL   Creatinine, Ser 0.85 0.40 - 1.20 mg/dL   Total Bilirubin 0.4 0.2 - 1.2 mg/dL   Alkaline Phosphatase 55 39 - 117 U/L   AST 22 0 - 37 U/L   ALT 23 0 - 35  U/L   Total Protein 6.3 6.0 - 8.3 g/dL   Albumin 4.1 3.5 - 5.2 g/dL   GFR 65.55 >60.00 mL/min   Calcium 9.7 8.4 - 10.5 mg/dL  Lipid panel  Result Value Ref Range   Cholesterol 184 0 - 200 mg/dL   Triglycerides 171.0 (H) 0.0 - 149.0 mg/dL   HDL 49.90 >39.00 mg/dL   VLDL 34.2 0.0 - 40.0 mg/dL   LDL Cholesterol 100 (H) 0 - 99 mg/dL   Total CHOL/HDL Ratio 4    NonHDL 134.12   Hemoglobin A1c  Result Value Ref Range   Hgb A1c MFr Bld 6.0 4.6 - 6.5 %  TSH  Result Value Ref Range   TSH 3.54 0.35 - 5.50 uIU/mL   The 10-year ASCVD risk score Mikey Bussing DC Jr., et al., 2013) is: 19.9%   Values used to calculate the score:     Age: 45 years     Sex: Female     Is Non-Hispanic African American: No     Diabetic: No     Tobacco smoker: No     Systolic Blood Pressure: 597 mmHg     Is BP treated: No     HDL Cholesterol: 49.9 mg/dL     Total Cholesterol: 184 mg/dL

## 2020-09-10 NOTE — Patient Instructions (Addendum)
Great to see you again today, I will be in touch with your labs asap We will have you take prednisone for 3 days for your ears/ sinuses.  Let me know if you are not feeling back to normal in the next few days - sooner if worse I do encourage you to get your covid booster!

## 2020-09-18 ENCOUNTER — Other Ambulatory Visit: Payer: Self-pay

## 2020-09-18 ENCOUNTER — Ambulatory Visit (INDEPENDENT_AMBULATORY_CARE_PROVIDER_SITE_OTHER): Payer: Medicare Other | Admitting: Family Medicine

## 2020-09-18 VITALS — BP 122/74 | HR 68 | Temp 98.1°F | Resp 15 | Ht 63.6 in | Wt 189.0 lb

## 2020-09-18 DIAGNOSIS — R7309 Other abnormal glucose: Secondary | ICD-10-CM | POA: Diagnosis not present

## 2020-09-18 DIAGNOSIS — Z130 Encounter for screening for diseases of the blood and blood-forming organs and certain disorders involving the immune mechanism: Secondary | ICD-10-CM

## 2020-09-18 DIAGNOSIS — Z131 Encounter for screening for diabetes mellitus: Secondary | ICD-10-CM | POA: Diagnosis not present

## 2020-09-18 DIAGNOSIS — E038 Other specified hypothyroidism: Secondary | ICD-10-CM

## 2020-09-18 DIAGNOSIS — R7303 Prediabetes: Secondary | ICD-10-CM

## 2020-09-18 DIAGNOSIS — H6983 Other specified disorders of Eustachian tube, bilateral: Secondary | ICD-10-CM

## 2020-09-18 DIAGNOSIS — Z13220 Encounter for screening for lipoid disorders: Secondary | ICD-10-CM

## 2020-09-18 DIAGNOSIS — R5383 Other fatigue: Secondary | ICD-10-CM | POA: Diagnosis not present

## 2020-09-18 LAB — CBC
HCT: 37.1 % (ref 36.0–46.0)
Hemoglobin: 12.5 g/dL (ref 12.0–15.0)
MCHC: 33.7 g/dL (ref 30.0–36.0)
MCV: 96.8 fl (ref 78.0–100.0)
Platelets: 358 10*3/uL (ref 150.0–400.0)
RBC: 3.84 Mil/uL — ABNORMAL LOW (ref 3.87–5.11)
RDW: 13.5 % (ref 11.5–15.5)
WBC: 5.3 10*3/uL (ref 4.0–10.5)

## 2020-09-18 LAB — HEMOGLOBIN A1C: Hgb A1c MFr Bld: 6 % (ref 4.6–6.5)

## 2020-09-18 MED ORDER — PREDNISONE 20 MG PO TABS: ORAL_TABLET | ORAL | 0 refills | Status: DC

## 2020-09-19 ENCOUNTER — Encounter: Payer: Self-pay | Admitting: Family Medicine

## 2020-09-19 DIAGNOSIS — R7303 Prediabetes: Secondary | ICD-10-CM | POA: Insufficient documentation

## 2020-09-19 LAB — COMPREHENSIVE METABOLIC PANEL
ALT: 23 U/L (ref 0–35)
AST: 22 U/L (ref 0–37)
Albumin: 4.1 g/dL (ref 3.5–5.2)
Alkaline Phosphatase: 55 U/L (ref 39–117)
BUN: 14 mg/dL (ref 6–23)
CO2: 26 mEq/L (ref 19–32)
Calcium: 9.7 mg/dL (ref 8.4–10.5)
Chloride: 106 mEq/L (ref 96–112)
Creatinine, Ser: 0.85 mg/dL (ref 0.40–1.20)
GFR: 65.55 mL/min (ref >60.00)
Glucose, Bld: 82 mg/dL (ref 70–99)
Potassium: 4.3 mEq/L (ref 3.5–5.1)
Sodium: 142 mEq/L (ref 135–145)
Total Bilirubin: 0.4 mg/dL (ref 0.2–1.2)
Total Protein: 6.3 g/dL (ref 6.0–8.3)

## 2020-09-19 LAB — TSH: TSH: 3.54 u[IU]/mL (ref 0.35–5.50)

## 2020-09-19 LAB — LIPID PANEL
Cholesterol: 184 mg/dL (ref 0–200)
HDL: 49.9 mg/dL (ref >39.00)
LDL Cholesterol: 100 mg/dL — ABNORMAL HIGH (ref 0–99)
NonHDL: 134.12
Total CHOL/HDL Ratio: 4
Triglycerides: 171 mg/dL — ABNORMAL HIGH (ref 0.0–149.0)
VLDL: 34.2 mg/dL (ref 0.0–40.0)

## 2020-10-17 ENCOUNTER — Ambulatory Visit (INDEPENDENT_AMBULATORY_CARE_PROVIDER_SITE_OTHER): Payer: Medicare Other

## 2020-10-17 VITALS — Ht 63.6 in | Wt 189.0 lb

## 2020-10-17 DIAGNOSIS — Z0000 Encounter for general adult medical examination without abnormal findings: Secondary | ICD-10-CM | POA: Diagnosis not present

## 2020-10-17 NOTE — Progress Notes (Signed)
Subjective:   Nicole Brennan is a 80 y.o. female who presents for Medicare Annual (Subsequent) preventive examination.  I connected with Lezlee today by telephone and verified that I am speaking with the correct person using two identifiers. Location patient: home Location provider: work Persons participating in the virtual visit: patient, Marine scientist.    I discussed the limitations, risks, security and privacy concerns of performing an evaluation and management service by telephone and the availability of in person appointments. I also discussed with the patient that there may be a patient responsible charge related to this service. The patient expressed understanding and verbally consented to this telephonic visit.    Interactive audio and video telecommunications were attempted between this provider and patient, however failed, due to patient having technical difficulties OR patient did not have access to video capability.  We continued and completed visit with audio only.  Some vital signs may be absent or patient reported.   Time Spent with patient on telephone encounter: 25 minutes   Review of Systems     Cardiac Risk Factors include: advanced age (>51mn, >>82women);obesity (BMI >30kg/m2)     Objective:    Today's Vitals   10/17/20 0821  Weight: 189 lb (85.7 kg)  Height: 5' 3.6" (1.615 m)   Body mass index is 32.85 kg/m.  Advanced Directives 10/17/2020 05/04/2020 01/27/2020 10/25/2019 05/10/2019 12/03/2018 09/30/2018  Does Patient Have a Medical Advance Directive? Yes Yes Yes Yes Yes Yes Yes  Type of AParamedicof AUniversityLiving will [place] will [place] will [place]Living will Living will;Healthcare Power of Attorney Living will HPembroke ParkLiving will  Does patient want to make changes to medical advance directive? - - No - Patient declined No - Patient declined - - -   Copy of [place] Chart? - No - copy requested No - copy requested - No - copy requested - No - copy requested  Would patient like information on creating a medical advance directive? - - - - - - -    Current Medications (verified) Outpatient Encounter Medications as of 10/17/2020  Medication Sig   acetaminophen (TYLENOL) 500 MG tablet Take 1,000 mg by mouth every 6 (six) hours as needed for headache.   Ascorbic Acid (VITAMIN C PO) Take by mouth.   Calcium-Phosphorus-Vitamin D [id number]-MG-UNIT CHEW Chew 2 tablets by mouth at bedtime.    carboxymethylcellul-glycerin (REFRESH OPTIVE) 0.5-0.9 % ophthalmic solution Place 1 drop into both eyes 2 (two) times daily as needed for dry eyes.   estradiol (ESTRACE) 0.1 MG/GM vaginal cream PLACE 1 APPLICATORFUL VAGINALLY 3 TIMES A WEEK   levothyroxine (SYNTHROID) 75 MCG tablet Take 1 tablet (75 mcg total) by mouth daily.   Lubricants (ASTROGLIDE) GEL Apply 1 applicator topically as needed.   Multiple Vitamins-Minerals (MULTIVITAMIN WITH MINERALS) tablet Take 1 tablet by mouth every evening. Centrum Silver   [DISCONTINUED] predniSONE (DELTASONE) 20 MG tablet Take 40 mg daily for 3 days   No facility-administered encounter medications on file as of 10/17/2020.    Allergies (verified) Penicillins   History: Past Medical History:  Diagnosis Date   Allergy    Arthritis    Cataract    History of radiation therapy 09/30/2018-10/28/2018   Vaginal cuff HDR; Dr. JGery Pray  Thyroid disease    Past Surgical History:  Procedure Laterality Date   DG  BONE DENSITY (AAritonHX)     KNEE  SURGERY     ROBOTIC ASSISTED TOTAL HYSTERECTOMY WITH BILATERAL SALPINGO OOPHERECTOMY Bilateral 08/20/2018   Procedure: XI ROBOTIC ASSISTED TOTAL HYSTERECTOMY WITH BILATERAL SALPINGO OOPHORECTOMY;  Surgeon: Everitt Amber, MD;  Location: [facility];  Service: Gynecology;  Laterality: Bilateral;   SENTINEL NODE BIOPSY N/A 08/20/2018   Procedure: SENTINEL  LYMPH  NODE BIOPSY;  Surgeon: Everitt Amber, MD;  Location: [facility];  Service: Gynecology;  Laterality: N/A;   TUBAL LIGATION     Family History  Problem Relation Age of Onset   Vascular Disease Mother    Colon cancer Mother    Dementia Father    Social History   Socioeconomic History   Marital status: Married    Spouse name: Not on file   Number of children: Not on file   Years of education: Not on file   Highest education level: Not on file  Occupational History   Not on file  Tobacco Use   Smoking status: Never   Smokeless tobacco: Never  Vaping Use   Vaping Use: Never used  Substance and Sexual Activity   Alcohol use: No   Drug use: No   Sexual activity: Yes  Other Topics Concern   Not on file  Social History Narrative   Not on file   Social Determinants of Health   Financial Resource Strain: Low Risk    Difficulty of Paying Living Expenses: Not hard at all  Food Insecurity: No Food Insecurity   Worried About Charity fundraiser in the Last Year: Never true   [place] in the Last Year: Never true  Transportation Needs: No Transportation Needs   Lack of Transportation (Medical): No   Lack of Transportation (Non-Medical): No  Physical Activity: Inactive   Days of Exercise per Week: 0 days   Minutes of Exercise per Session: 0 min  Stress: No Stress Concern Present   Feeling of Stress : Not at all  Social Connections: Moderately Integrated   Frequency of Communication with Friends and Family: More than three times a week   Frequency of Social Gatherings with Friends and Family: More than three times a week   Attends Religious Services: More than 4 times per year   Active Member of [employer] or Organizations: No   Attends Music therapist: Never   Marital Status: Married    Tobacco Counseling Counseling given: Not Answered   Clinical Intake:  Pre-visit preparation completed: Yes  Pain : No/denies pain     Nutritional Status: BMI > 30   Obese Nutritional Risks: None Diabetes: No  How often do you need to have someone help you when you read instructions, pamphlets, or other written materials from your doctor or pharmacy?: 1 - Never  Diabetic?No  Interpreter Needed?: No  Information entered by :: Caroleen Hamman LPN   Activities of Daily Living In your present state of health, do you have any difficulty performing the following activities: 10/17/2020  Hearing? N  Vision? N  Difficulty concentrating or making decisions? N  Walking or climbing stairs? N  Dressing or bathing? N  Doing errands, shopping? N  Preparing Food and eating ? N  Using the Toilet? N  In the past six months, have you accidently leaked urine? N  Do you have problems with loss of bowel control? N  Managing your Medications? N  Managing your Finances? N  Housekeeping or managing your Housekeeping? N  Some recent data might be hidden    Patient Care Team:  Copland, Gay Filler, MD as PCP - General (Family Medicine) Melrose Nakayama, MD as Consulting Physician (Orthopedic Surgery)  Indicate any recent Medical Services you may have received from other than [facility] providers in the past year (date may be approximate).     Assessment:   This is a routine wellness examination for Nicole Brennan.  Hearing/Vision screen Hearing Screening - Comments:: C/o mild hearing loss Vision Screening - Comments:: Last eye exam-Dr York 3- months ago  Dietary issues and exercise activities discussed: Current Exercise Habits: The patient does not participate in regular exercise at present, Exercise limited by: None identified   Goals Addressed             This Visit's Progress    Maintain healthy active lifestyle.   On track    Patient Stated       Drink more water       Depression Screen PHQ 2/9 Scores 10/17/2020 09/18/2020 08/10/2020 10/13/2017 07/01/2016 09/04/2015 08/11/2015  PHQ - 2 Score 0 0 0 0 0 0 0    Fall Risk Fall Risk  10/17/2020 09/18/2020 08/10/2020 02/03/2019  10/13/2017  Falls in the past year? 0 0 0 0 No  Comment - - - Emmi Telephone Survey: data to providers prior to load -  Number falls in past yr: 0 - - - -  Injury with Fall? 0 - - - -  Follow up Falls prevention discussed - - - -    FALL RISK PREVENTION PERTAINING TO THE HOME:  Any stairs in or around the home? No  Home free of loose throw rugs in walkways, pet beds, electrical cords, etc? Yes  Adequate lighting in your home to reduce risk of falls? Yes   ASSISTIVE DEVICES UTILIZED TO PREVENT FALLS:  Life alert? No  Use of a cane, walker or w/c? No  Grab bars in the bathroom? Yes  Shower chair or bench in shower? No  Elevated toilet seat or a handicapped toilet? No   TIMED UP AND GO:  Was the test performed? No-phone visit.    Cognitive Function:Normal cognitive status assessed by  this Nurse Health Advisor. No abnormalities found.   MMSE - Mini Mental State Exam 07/01/2016  Orientation to time 5  Orientation to Place 5  Registration 3  Attention/ Calculation 5  Recall 3  Language- name 2 objects 2  Language- repeat 1  Language- follow 3 step command 3  Language- read & follow direction 1  Write a sentence 1  Copy design 1  Total score 30        Immunizations Immunization History  Administered Date(s) Administered   Fluad Quad(high Dose 65+) 12/01/2018, 12/09/2019   Influenza Whole 12/10/2011   Influenza, High Dose Seasonal PF 01/22/2016, 12/26/2016, 12/30/2017   Influenza,inj,Quad PF,6+ Mos 12/21/2013, 11/15/2014   PFIZER(Purple Top)SARS-COV-2 Vaccination 03/27/2019, 04/17/2019   Pneumococcal Conjugate-13 10/25/2013   Pneumococcal Polysaccharide-23 10/15/2017   Pneumococcal-Unspecified 03/12/2003   Td 08/10/2020   Tdap 03/12/2007   Zoster, Live 03/11/2008    TDAP status: Up to date  Flu Vaccine status: Up to date  Pneumococcal vaccine status: Up to date  Covid-19 vaccine status: Information provided on how to obtain vaccines. Booster due  Qualifies  for Shingles Vaccine? Yes   Zostavax completed No   Shingrix Completed?: No.    Education has been provided regarding the importance of this vaccine. Patient has been advised to call insurance company to determine out of pocket expense if they have not yet received this vaccine. Advised  may also receive vaccine at local pharmacy or Health Dept. Verbalized acceptance and understanding.  Screening Tests Health Maintenance  Topic Date Due   COVID-19 Vaccine (3 - Pfizer risk series) 05/15/2019   INFLUENZA VACCINE  10/09/2020   Zoster Vaccines- Shingrix (1 of 2) 11/10/2020 (Originally 02/22/1960)   TETANUS/TDAP  08/11/2030   DEXA SCAN  Completed   Hepatitis C Screening  Completed   PNA vac Low Risk Adult  Completed   HPV VACCINES  Aged Out    Health Maintenance  Health Maintenance Due  Topic Date Due   COVID-19 Vaccine (3 - Pfizer risk series) 05/15/2019   INFLUENZA VACCINE  10/09/2020    Colorectal cancer screening: No longer required.   Mammogram status: Completed Bilateral 08/01/2020. Repeat every year  Bone Density status: Completed 11/25/2019. Results reflect: Bone density results: OSTEOPENIA. Repeat every 2 years.  Lung Cancer Screening: (Low Dose CT Chest recommended if Age 55-80 years, 30 pack-year currently smoking OR have quit w/in 15years.) does not qualify.    Additional Screening:  Hepatitis C Screening: does not qualify  Vision Screening: Recommended annual ophthalmology exams for early detection of glaucoma and other disorders of the eye. Is the patient up to date with their annual eye exam?  Yes  Who is the provider or what is the name of the office in which the patient attends annual eye exams? Dr. Allean Found   Dental Screening: Recommended annual dental exams for proper oral hygiene  Community Resource Referral / Chronic Care Management: CRR required this visit?  No   CCM required this visit?  No      Plan:     I have personally reviewed and noted the  following in the patient's chart:   Medical and social history Use of alcohol, tobacco or illicit drugs  Current medications and supplements including opioid prescriptions.  Functional ability and status Nutritional status Physical activity Advanced directives List of other physicians Hospitalizations, surgeries, and ER visits in previous 12 months Vitals Screenings to include cognitive, depression, and falls Referrals and appointments  In addition, I have reviewed and discussed with patient certain preventive protocols, quality metrics, and best practice recommendations. A written personalized care plan for preventive services as well as general preventive health recommendations were provided to patient.   Due to this being a telephonic visit, the after visit summary with patients personalized plan was offered to patient via mail or my-chart. Patient would like to access on my-chart.   Marta Antu, LPN   [id number]  Nurse Health Advisor  Nurse Notes: None

## 2020-10-17 NOTE — Patient Instructions (Signed)
Nicole Brennan , Thank you for taking time to complete your Medicare Wellness Visit. I appreciate your ongoing commitment to your health goals. Please review the following plan we discussed and let me know if I can assist you in the future.   Screening recommendations/referrals: Colonoscopy: No longer required Mammogram: Completed 08/01/2020-Due 08/01/2021 Bone Density: Completed 11/25/2019-Due 11/24/2021 Recommended yearly ophthalmology/optometry visit for glaucoma screening and checkup Recommended yearly dental visit for hygiene and checkup  Vaccinations: Influenza vaccine: Up to date-Due-11/2020 Pneumococcal vaccine: Up to date Tdap vaccine: Up to date-Due-08/11/2030 Shingles vaccine: Declined due to cost   Covid-19:Booster due  Advanced directives: Per our conversation, you have documents.  Conditions/risks identified: See problem list  Next appointment: Follow up in one year for your annual wellness visit    Preventive Care 79 Years and Older, Female Preventive care refers to lifestyle choices and visits with your health care provider that can promote health and wellness. What does preventive care include? A yearly physical exam. This is also called an annual well check. Dental exams once or twice a year. Routine eye exams. Ask your health care provider how often you should have your eyes checked. Personal lifestyle choices, including: Daily care of your teeth and gums. Regular physical activity. Eating a healthy diet. Avoiding tobacco and drug use. Limiting alcohol use. Practicing safe sex. Taking low-dose aspirin every day. Taking vitamin and mineral supplements as recommended by your health care provider. What happens during an annual well check? The services and screenings done by your health care provider during your annual well check will depend on your age, overall health, lifestyle risk factors, and family history of disease. Counseling  Your health care provider may ask you  questions about your: Alcohol use. Tobacco use. Drug use. Emotional well-being. Home and relationship well-being. Sexual activity. Eating habits. History of falls. Memory and ability to understand (cognition). Work and work Statistician. Reproductive health. Screening  You may have the following tests or measurements: Height, weight, and BMI. Blood pressure. Lipid and cholesterol levels. These may be checked every 5 years, or more frequently if you are over 41 years old. Skin check. Lung cancer screening. You may have this screening every year starting at age 60 if you have a 30-pack-year history of smoking and currently smoke or have quit within the past 15 years. Fecal occult blood test (FOBT) of the stool. You may have this test every year starting at age 35. Flexible sigmoidoscopy or colonoscopy. You may have a sigmoidoscopy every 5 years or a colonoscopy every 10 years starting at age 32. Hepatitis C blood test. Hepatitis B blood test. Sexually transmitted disease (STD) testing. Diabetes screening. This is done by checking your blood sugar (glucose) after you have not eaten for a while (fasting). You may have this done every 1-3 years. Bone density scan. This is done to screen for osteoporosis. You may have this done starting at age 65. Mammogram. This may be done every 1-2 years. Talk to your health care provider about how often you should have regular mammograms. Talk with your health care provider about your test results, treatment options, and if necessary, the need for more tests. Vaccines  Your health care provider may recommend certain vaccines, such as: Influenza vaccine. This is recommended every year. Tetanus, diphtheria, and acellular pertussis (Tdap, Td) vaccine. You may need a Td booster every 10 years. Zoster vaccine. You may need this after age 12. Pneumococcal 13-valent conjugate (PCV13) vaccine. One dose is recommended after age 31. Pneumococcal  polysaccharide  (PPSV23) vaccine. One dose is recommended after age 74. Talk to your health care provider about which screenings and vaccines you need and how often you need them. This information is not intended to replace advice given to you by your health care provider. Make sure you discuss any questions you have with your health care provider. Document Released: 03/24/2015 Document Revised: 11/15/2015 Document Reviewed: 12/27/2014 Elsevier Interactive Patient Education  2017 [place] Prevention in the Home Falls can cause injuries. They can happen to people of all ages. There are many things you can do to make your home safe and to help prevent falls. What can I do on the outside of my home? Regularly fix the edges of walkways and driveways and fix any cracks. Remove anything that might make you trip as you walk through a door, such as a raised step or threshold. Trim any bushes or trees on the path to your home. Use bright outdoor lighting. Clear any walking paths of anything that might make someone trip, such as rocks or tools. Regularly check to see if handrails are loose or broken. Make sure that both sides of any steps have handrails. Any raised decks and porches should have guardrails on the edges. Have any leaves, snow, or ice cleared regularly. Use sand or salt on walking paths during winter. Clean up any spills in your garage right away. This includes oil or grease spills. What can I do in the bathroom? Use night lights. Install grab bars by the toilet and in the tub and shower. Do not use towel bars as grab bars. Use non-skid mats or decals in the tub or shower. If you need to sit down in the shower, use a plastic, non-slip stool. Keep the floor dry. Clean up any water that spills on the floor as soon as it happens. Remove soap buildup in the tub or shower regularly. Attach bath mats securely with double-sided non-slip rug tape. Do not have throw rugs and other things on the  floor that can make you trip. What can I do in the bedroom? Use night lights. Make sure that you have a light by your bed that is easy to reach. Do not use any sheets or blankets that are too big for your bed. They should not hang down onto the floor. Have a firm chair that has side arms. You can use this for support while you get dressed. Do not have throw rugs and other things on the floor that can make you trip. What can I do in the kitchen? Clean up any spills right away. Avoid walking on wet floors. Keep items that you use a lot in easy-to-reach places. If you need to reach something above you, use a strong step stool that has a grab bar. Keep electrical cords out of the way. Do not use floor polish or wax that makes floors slippery. If you must use wax, use non-skid floor wax. Do not have throw rugs and other things on the floor that can make you trip. What can I do with my stairs? Do not leave any items on the stairs. Make sure that there are handrails on both sides of the stairs and use them. Fix handrails that are broken or loose. Make sure that handrails are as long as the stairways. Check any carpeting to make sure that it is firmly attached to the stairs. Fix any carpet that is loose or worn. Avoid having throw rugs at the top  or bottom of the stairs. If you do have throw rugs, attach them to the floor with carpet tape. Make sure that you have a light switch at the top of the stairs and the bottom of the stairs. If you do not have them, ask someone to add them for you. What else can I do to help prevent falls? Wear shoes that: Do not have high heels. Have rubber bottoms. Are comfortable and fit you well. Are closed at the toe. Do not wear sandals. If you use a stepladder: Make sure that it is fully opened. Do not climb a closed stepladder. Make sure that both sides of the stepladder are locked into place. Ask someone to hold it for you, if possible. Clearly mark and make  sure that you can see: Any grab bars or handrails. First and last steps. Where the edge of each step is. Use tools that help you move around (mobility aids) if they are needed. These include: Canes. Walkers. Scooters. Crutches. Turn on the lights when you go into a dark area. Replace any light bulbs as soon as they burn out. Set up your furniture so you have a clear path. Avoid moving your furniture around. If any of your floors are uneven, fix them. If there are any pets around you, be aware of where they are. Review your medicines with your doctor. Some medicines can make you feel dizzy. This can increase your chance of falling. Ask your doctor what other things that you can do to help prevent falls. This information is not intended to replace advice given to you by your health care provider. Make sure you discuss any questions you have with your health care provider. Document Released: 12/22/2008 Document Revised: 08/03/2015 Document Reviewed: 04/01/2014 Elsevier Interactive Patient Education  2017 [employer].

## 2020-10-27 NOTE — Progress Notes (Signed)
Radiation Oncology         [phone] ________________________________  Name: Nicole Brennan MRN: [id number]  Date: 10/30/2020  DOB: [birth year]  Follow-Up Visit Note  CC: Copland, Gay Filler, MD  Copland, Gay Filler, MD    ICD-10-CM   1. Endometrial carcinoma (HCC)  C54.1       Diagnosis:  FIGO Stage I-B, grade 1 endometrioid adenocarcinoma, pT1b, pN0  Interval Since Last Radiation:  2 years and 3 days   Radiation Treatment Dates: 09/30/2018, 10/07/2018, 10/14/2018, 10/21/2018, 10/28/2018:  Site/Dose: Vaginal Cuff (Brachytherapy) / 30 Gy in 5 fractions  Narrative:  The patient returns today for routine follow-up, she was last seen by me on 05/04/20. Since then, the patient followed up with Dr. Denman George on 07/28/20. During this visit, the patient was noted to exhibit no evidence of disease recurrence. Of note: physical exam performed during this visit noted the vulva/vagina with lichen sclerosis distribution around the vulvar; particularly to the labia majora. Examination of vaginal cuff was poorly tolerated by the patient resulting in limited evaluation of the area.    Pertinent imaging since the patient was last seen includes a bilateral screening mammogram performed on 08/01/20. Findings demonstrated no mammographic evidence of malignancy.   She denies any abdominal bloating vaginal pain or vaginal bleeding.  She is not using her vaginal dilator but is sexually active with her husband approximately 3 times per week.  She denies any postcoital bleeding.  Allergies:  is allergic to penicillins.  Meds: Current Outpatient Medications  Medication Sig Dispense Refill   acetaminophen (TYLENOL) 500 MG tablet Take 1,000 mg by mouth every 6 (six) hours as needed for headache.     Ascorbic Acid (VITAMIN C PO) Take by mouth.     Calcium-Phosphorus-Vitamin D [id number] MG-MG-UNIT CHEW Chew 2 tablets by mouth at bedtime.      carboxymethylcellul-glycerin (REFRESH OPTIVE) 0.5-0.9 % ophthalmic  solution Place 1 drop into both eyes 2 (two) times daily as needed for dry eyes.     levothyroxine (SYNTHROID) 75 MCG tablet Take 1 tablet (75 mcg total) by mouth daily. 90 tablet 3   Lubricants (ASTROGLIDE) GEL Apply 1 applicator topically as needed. 66.5 g 3   Multiple Vitamins-Minerals (MULTIVITAMIN WITH MINERALS) tablet Take 1 tablet by mouth every evening. Centrum Silver     estradiol (ESTRACE) 0.1 MG/GM vaginal cream PLACE 1 APPLICATORFUL VAGINALLY 3 TIMES A WEEK (Patient not taking: Reported on 10/30/2020) 42.5 g 3   No current facility-administered medications for this encounter.    Physical Findings: The patient is in no acute distress. Patient is alert and oriented.  height is '5\' 5"'$  (1.651 m) and weight is 188 lb 6.4 oz (85.5 kg). Her temperature is 97 F (36.1 C) (abnormal). Her blood pressure is 126/44 (abnormal) and her pulse is 69. Her respiration is 18 and oxygen saturation is 99%. .  No significant changes. Lungs are clear to auscultation bilaterally. Heart has regular rate and rhythm. No palpable cervical, supraclavicular, or axillary adenopathy. Abdomen soft, non-tender, normal bowel sounds.  On pelvic examination the external genitalia were unremarkable. A speculum exam was performed. There are no mucosal lesions noted in the vaginal vault.  No lesions noted along the vaginal cuff.  On bimanual examination there are no pelvic masses appreciated.   Lab Findings: Lab Results  Component Value Date   WBC 5.3 09/18/2020   HGB 12.5 09/18/2020   HCT 37.1 09/18/2020   MCV 96.8 09/18/2020   PLT 358.0 09/18/2020  Radiographic Findings: No results found.  Impression:  FIGO Stage I-B, grade 1 endometrioid adenocarcinoma, pT1b, pN0  No evidence of recurrence on clinical exam today.  She does not appear to have any lasting effects from her vaginal brachytherapy treatments.  Plan: She is now 2 years out from her radiation therapy we can stretch out her follow-up appointments to  every 6 months.  The patient will be seen by Dr. Berline Lopes in approximately 6 months and then follow-up with me August 2023.   20 minutes of total time was spent for this patient encounter, including preparation, face-to-face counseling with the patient and coordination of care, physical exam, and documentation of the encounter. ____________________________________  Blair Promise, PhD, MD   This document serves as a record of services personally performed by Gery Pray, MD. It was created on his behalf by Roney Mans, a trained medical scribe. The creation of this record is based on the scribe's personal observations and the provider's statements to them. This document has been checked and approved by the attending provider.

## 2020-10-30 ENCOUNTER — Ambulatory Visit
Admission: RE | Admit: 2020-10-30 | Discharge: 2020-10-30 | Disposition: A | Discharge status: Home / Self Care | Payer: Medicare Other | Source: Ambulatory Visit | Attending: Radiation Oncology | Admitting: Radiation Oncology

## 2020-10-30 ENCOUNTER — Encounter: Payer: Self-pay | Admitting: Radiation Oncology

## 2020-10-30 ENCOUNTER — Other Ambulatory Visit: Payer: Self-pay

## 2020-10-30 VITALS — BP 126/44 | HR 69 | Temp 97.0°F | Resp 18 | Ht 65.0 in | Wt 188.4 lb

## 2020-10-30 DIAGNOSIS — Z08 Encounter for follow-up examination after completed treatment for malignant neoplasm: Secondary | ICD-10-CM | POA: Diagnosis not present

## 2020-10-30 DIAGNOSIS — Z923 Personal history of irradiation: Secondary | ICD-10-CM | POA: Diagnosis not present

## 2020-10-30 DIAGNOSIS — Z8542 Personal history of malignant neoplasm of other parts of uterus: Secondary | ICD-10-CM | POA: Insufficient documentation

## 2020-10-30 DIAGNOSIS — N904 Leukoplakia of vulva: Secondary | ICD-10-CM | POA: Insufficient documentation

## 2020-10-30 DIAGNOSIS — C541 Malignant neoplasm of endometrium: Secondary | ICD-10-CM

## 2020-10-30 NOTE — Progress Notes (Signed)
Nicole Brennan is here today for follow up post radiation to the pelvic.  They completed their radiation on: 10/28/2018   Does the patient complain of any of the following:  Pain:denies Abdominal bloating: denies Diarrhea/Constipation: occasional constipation Nausea/Vomiting: denies Vaginal Discharge: denies Blood in Urine or Stool: denies Urinary Issues (dysuria/incomplete emptying/ incontinence/ increased frequency/urgency): nocturia x 2-3, denies any other urinary symptoms Does patient report using vaginal dilator 2-3 times a week and/or sexually active 2-3 weeks: doesn't need to use this now Post radiation skin changes: not applicable   Additional comments if applicable: none  Vitals:   10/30/20 1104  BP: (!) 126/44  Pulse: 69  Resp: 18  Temp: (!) 97 F (36.1 C)  SpO2: 99%  Weight: 188 lb 6.4 oz (85.5 kg)  Height: '5\' 5"'$  (1.651 m)

## 2020-11-01 ENCOUNTER — Ambulatory Visit: Payer: Medicare Other

## 2020-11-10 ENCOUNTER — Other Ambulatory Visit (HOSPITAL_BASED_OUTPATIENT_CLINIC_OR_DEPARTMENT_OTHER): Payer: Self-pay

## 2020-12-08 ENCOUNTER — Telehealth: Payer: Self-pay | Admitting: *Deleted

## 2020-12-08 NOTE — Telephone Encounter (Signed)
Patient called after hours and needed an appointment for vaginal itch.  Advised that we did not have any openings this morning and this afternoon and she needed to go to urgent care.  She will check with the urgent care by her house.

## 2020-12-11 DIAGNOSIS — N898 Other specified noninflammatory disorders of vagina: Secondary | ICD-10-CM | POA: Diagnosis not present

## 2020-12-11 DIAGNOSIS — B3731 Acute candidiasis of vulva and vagina: Secondary | ICD-10-CM | POA: Diagnosis not present

## 2020-12-14 ENCOUNTER — Telehealth: Payer: Self-pay | Admitting: Family Medicine

## 2020-12-14 NOTE — Telephone Encounter (Signed)
Are you willing to send an Rx? No openings for the afternoon- I am speaking to front office staff to see if she has called OB back about the issue since she is on the phone with her now.

## 2020-12-14 NOTE — Telephone Encounter (Signed)
I am not able to view her OBG report from Monday Called pt back  She has pre-diabetes, not on any medication for same  Lab Results  Component Value Date   HGBA1C 6.0 09/18/2020   She saw her GYN on Monday, was given what sounds like Diflucan 150 to take every 72 hours for 3 doses as well as a nystatin cream.  Apparently her symptoms seem to be worse yesterday, she called back and was given a prescription for nystatin powder.  She notes that she continues to be miserable with irritation and itching  I apologize to her that I am not able to see her today, I do not have any openings.  I encouraged her to call her gynecologist today and let them know she is worse, request to recheck-as they are treating her for this condition they would be the most appropriate person to provide follow-up.  If she does not wish to do this, she could also try an over-the-counter Monistat multi day pack.  I asked her to let me know if I can do anything else to help

## 2020-12-14 NOTE — Telephone Encounter (Signed)
Patient calling stating she went into the OB on Monday, 10-4 for a terrible yeast infection  Seeing no changes with medication, she has taken 2 of the 3 rounds of fluconazole  Also nystatin powder....  Very raw down there, she states. She really wants to know what she can do!  Please advise

## 2020-12-20 DIAGNOSIS — N898 Other specified noninflammatory disorders of vagina: Secondary | ICD-10-CM | POA: Diagnosis not present

## 2020-12-20 DIAGNOSIS — B3731 Acute candidiasis of vulva and vagina: Secondary | ICD-10-CM | POA: Diagnosis not present

## 2020-12-24 DIAGNOSIS — Z23 Encounter for immunization: Secondary | ICD-10-CM | POA: Diagnosis not present

## 2021-01-02 DIAGNOSIS — B3731 Acute candidiasis of vulva and vagina: Secondary | ICD-10-CM | POA: Diagnosis not present

## 2021-01-22 ENCOUNTER — Telehealth: Payer: Self-pay | Admitting: *Deleted

## 2021-01-22 NOTE — Telephone Encounter (Signed)
Patient called and wanted to schedule a follow up appt for February. Explained that the schedule is not open for February yet and the office will call her back to scheduled an appt

## 2021-04-04 ENCOUNTER — Encounter: Payer: Self-pay | Admitting: Gynecologic Oncology

## 2021-04-09 ENCOUNTER — Inpatient Hospital Stay: Payer: Medicare Other | Attending: Gynecologic Oncology | Admitting: Gynecologic Oncology

## 2021-04-09 ENCOUNTER — Other Ambulatory Visit: Payer: Self-pay

## 2021-04-09 ENCOUNTER — Encounter: Payer: Self-pay | Admitting: Gynecologic Oncology

## 2021-04-09 VITALS — BP 133/59 | HR 64 | Temp 97.6°F | Resp 16 | Ht 65.0 in | Wt 187.0 lb

## 2021-04-09 DIAGNOSIS — Z79899 Other long term (current) drug therapy: Secondary | ICD-10-CM | POA: Diagnosis not present

## 2021-04-09 DIAGNOSIS — Z90722 Acquired absence of ovaries, bilateral: Secondary | ICD-10-CM | POA: Insufficient documentation

## 2021-04-09 DIAGNOSIS — C541 Malignant neoplasm of endometrium: Secondary | ICD-10-CM

## 2021-04-09 DIAGNOSIS — E039 Hypothyroidism, unspecified: Secondary | ICD-10-CM | POA: Insufficient documentation

## 2021-04-09 DIAGNOSIS — Z8249 Family history of ischemic heart disease and other diseases of the circulatory system: Secondary | ICD-10-CM | POA: Diagnosis not present

## 2021-04-09 DIAGNOSIS — Z818 Family history of other mental and behavioral disorders: Secondary | ICD-10-CM | POA: Diagnosis not present

## 2021-04-09 DIAGNOSIS — Z800 Family history of malignant neoplasm of digestive organs: Secondary | ICD-10-CM | POA: Insufficient documentation

## 2021-04-09 DIAGNOSIS — G35 Multiple sclerosis: Secondary | ICD-10-CM | POA: Insufficient documentation

## 2021-04-09 DIAGNOSIS — Z923 Personal history of irradiation: Secondary | ICD-10-CM | POA: Insufficient documentation

## 2021-04-09 NOTE — Patient Instructions (Signed)
It was very nice to meet you today.  I do not see or feel any evidence of cancer recurrence on your exam.  Now that we are more than 2 years out from finishing your treatment, we will continue with visits every 6 months until 5 years.  Please call back sometime after late November of this year to get scheduled to see me in late January 2024.  If you develop any symptoms between now and your visit with Dr. Sondra Come, please call to see me sooner.

## 2021-04-09 NOTE — Progress Notes (Signed)
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 The patient's history began in May 2020 when she began experiencing vaginal spotting and was seen by Dr. Murrell Redden performed a transvaginal ultrasound scan which showed a thickened endometrium which then prompted an endometrial biopsy which was performed on Jul 27, 2018.  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   On August 20, 2018 she underwent a robotic assisted total hysterectomy, BSO sentinel lymph node biopsy.  Intraoperative findings were unremarkable for extrauterine disease.  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  Sentinel lymph nodes were negative for metastatic disease.  The cervix, and adnexa are also negative for metastatic disease.  There was no lymphovascular or perineural invasion seen.   Due to her deep myometrial invasion and at her age she met criteria for intermediate to high risk for recurrence.  She was recommended adjuvant radiation to the vaginal cuff in accordance with NCCN guidelines.  She received vaginal cuff brachytherapy (30 Pearline Cables in 5 fractions) between the dates of July 22 and October 28, 2018.  She tolerated treatment well.   Interval History: Patient last saw Dr. Sondra Come on 8/22.  She was doing well at that time and NED.  Today, patient notes she is doing well.  She denies any vaginal bleeding or discharge.  She reports regular bowel and bladder function.  Some nights, she gets up 2-3 times a night to urinate,  unchanged recently.  Denies any abdominal or pelvic pain.  Reports good appetite without nausea or emesis.  Past Medical/Surgical History: Past Medical History:  Diagnosis Date   Allergy    Arthritis    Cataract    History of radiation therapy 09/30/2018-10/28/2018   Vaginal cuff HDR; Dr. Gery Pray   Thyroid disease     Past Surgical History:  Procedure Laterality Date   DG  BONE DENSITY ([place] HX)     KNEE SURGERY     ROBOTIC ASSISTED TOTAL HYSTERECTOMY WITH BILATERAL SALPINGO OOPHERECTOMY Bilateral 08/20/2018   Procedure: XI ROBOTIC ASSISTED TOTAL HYSTERECTOMY WITH BILATERAL SALPINGO OOPHORECTOMY;  Surgeon: Everitt Amber, MD;  Location: [facility];  Service: Gynecology;  Laterality: Bilateral;   SENTINEL NODE BIOPSY N/A 08/20/2018   Procedure: SENTINEL LYMPH  NODE BIOPSY;  Surgeon: Everitt Amber, MD;  Location: [facility];  Service: Gynecology;  Laterality: N/A;   TUBAL LIGATION      Family History  Problem Relation Age of Onset   Vascular Disease Mother    Colon cancer Mother    Dementia Father     Social History   Socioeconomic History   Marital status: Married    Spouse name: Not on file   Number of children: Not on file   Years of education: Not on file   Highest education level: Not on file  Occupational History   Not on file  Tobacco Use   Smoking status: Never   Smokeless tobacco: Never  Vaping Use   Vaping Use: Never used  Substance and Sexual Activity   Alcohol use: No   Drug use: No  Sexual activity: Yes  Other Topics Concern   Not on file  Social History Narrative   Not on file   Social Determinants of Health   Financial Resource Strain: Low Risk    Difficulty of Paying Living Expenses: Not hard at all  Food Insecurity: No Food Insecurity   Worried About [place] in the Last Year: Never true   [place] in the Last Year: Never true  Transportation Needs: No Transportation Needs   Lack of Transportation (Medical): No   Lack of  Transportation (Non-Medical): No  Physical Activity: Inactive   Days of Exercise per Week: 0 days   Minutes of Exercise per Session: 0 min  Stress: No Stress Concern Present   Feeling of Stress : Not at all  Social Connections: Moderately Integrated   Frequency of Communication with Friends and Family: More than three times a week   Frequency of Social Gatherings with Friends and Family: More than three times a week   Attends Religious Services: More than 4 times per year   Active Member of [employer] or Organizations: No   Attends Archivist Meetings: Never   Marital Status: Married    Current Medications:  Current Outpatient Medications:    acetaminophen (TYLENOL) 500 MG tablet, Take 1,000 mg by mouth every 6 (six) hours as needed for headache., Disp: , Rfl:    Ascorbic Acid (VITAMIN C PO), Take by mouth., Disp: , Rfl:    Calcium-Phosphorus-Vitamin D 144-315-400 MG-MG-UNIT CHEW, Chew 2 tablets by mouth at bedtime. , Disp: , Rfl:    carboxymethylcellul-glycerin (REFRESH OPTIVE) 0.5-0.9 % ophthalmic solution, Place 1 drop into both eyes 2 (two) times daily as needed for dry eyes., Disp: , Rfl:    levothyroxine (SYNTHROID) 75 MCG tablet, Take 1 tablet (75 mcg total) by mouth daily., Disp: 90 tablet, Rfl: 3   Lubricants (ASTROGLIDE) GEL, Apply 1 applicator topically as needed., Disp: 66.5 g, Rfl: 3   Multiple Vitamins-Minerals (MULTIVITAMIN WITH MINERALS) tablet, Take 1 tablet by mouth every evening. Centrum Silver, Disp: , Rfl:   Review of Systems: Denies appetite changes, fevers, chills, fatigue, unexplained weight changes. Denies hearing loss, neck lumps or masses, mouth sores, ringing in ears or voice changes. Denies cough or wheezing.  Denies shortness of breath. Denies chest pain or palpitations. Denies leg swelling. Denies abdominal distention, pain, blood in stools, constipation, diarrhea, nausea, vomiting, or early satiety. Denies pain with intercourse, dysuria, frequency,  hematuria or incontinence. Denies hot flashes, pelvic pain, vaginal bleeding or vaginal discharge.   Denies joint pain, back pain or muscle pain/cramps. Denies itching, rash, or wounds. Denies dizziness, headaches, numbness or seizures. Denies swollen lymph nodes or glands, denies easy bruising or bleeding. Denies anxiety, depression, confusion, or decreased concentration.  Physical Exam: BP (!) 133/59 (BP Location: Right Arm, Patient Position: Sitting)    Pulse 64    Temp 97.6 F (36.4 C) (Tympanic)    Resp 16    Ht _0  (1.651 m)    Wt 187 lb (84.8 kg)    SpO2 100%    BMI 31.12 kg/m  General: Alert, oriented, no acute distress. HEENT: Normocephalic, atraumatic, sclera anicteric. Chest: Clear to auscultation bilaterally.  No wheezes or rhonchi. Cardiovascular: Regular rate and rhythm, no murmurs. Abdomen: Obese, soft, nontender.  Normoactive bowel sounds.  No masses or hepatosplenomegaly appreciated.  Well-healed incisions. Extremities: Grossly normal range of motion.  Warm, well perfused.  No edema bilaterally. Skin: No rashes or lesions  noted. Lymphatics: No cervical, supraclavicular, or inguinal adenopathy. GU: Normal appearing external genitalia without erythema, excoriation, or lesions.  Speculum exam reveals mildly atrophic vaginal mucosa, radiation changes apparent, no bleeding or discharge, no masses.  Bimanual exam reveals cuff intact, no nodularity or masses.  Rectovaginal exam confirms these findings.  Laboratory & Radiologic Studies: None new  Assessment & Plan: LORRIANN HANSMANN is a 81 y.o. woman with stage IB grade 1 endometrial cancer.  [name] has a diagnosis of stage IB grade 1 endometrioid endometrial cancer with deep myometrial invasion. S/p adjuvant radiation (vaginal brachytherapy) - completed 10/28/18.   No evidence of disease.  Patient is overall doing very well.   Per NCCN surveillance recommendations, I recommend she follow-up at 6 month intervals until 5 years out  from completion of adjuvant treatment.  This will continue to alternate between our office and radiation oncology.  She is scheduled to see Dr. Sondra Come in August.  I have asked her to call back at the end of the year to schedule a visit with me in January or February 2024.  We reviewed signs and symptoms that would be concerning for disease recurrence and I stressed the importance of calling to see me sooner if she develops any of these.  28 minutes of total time was spent for this patient encounter, including preparation, face-to-face counseling with the patient and coordination of care, and documentation of the encounter.  Jeral Pinch, MD  Division of Gynecologic Oncology  Department of Obstetrics and Gynecology  [facility]

## 2021-04-16 ENCOUNTER — Telehealth: Payer: Self-pay | Admitting: Family Medicine

## 2021-04-16 NOTE — Telephone Encounter (Signed)
Called left detailed message to call back and schedule a procedure appointment (30 minute appt).

## 2021-04-16 NOTE — Telephone Encounter (Signed)
Patient would like to know if Dr. Lorelei Pont has a recommendation for a podiatrist to remove her acrylic toe nail. She states she would like to go this morning so she needs a call back asap. She was informed that both Copland and CMA are assisting patients but a message would be sent. Please advice.

## 2021-04-16 NOTE — Telephone Encounter (Signed)
Pt says about 7-8 years ago her toe nail was initially injured. She has been wearing acrylic toes nails.    Last night the nail got hooked on something and is lifting. She does not think she needs a referral to be seen by a specialist, but was wondering if anyone here took care of that sort of thing.  She leaves for work at [employer] and was hoping to be seen before then.   Please advise

## 2021-04-16 NOTE — Telephone Encounter (Signed)
JC says she has spoken to [facility] about this pt.   I called her to let her know that he could do this for her but that it would not be today- she voices understanding. I let her know that you would give her a call to schedule (since I do not know how to schedule his procedure).  She is aware that her other option is to go to [facility] if this does not work for her.   Thanks

## 2021-04-16 NOTE — Telephone Encounter (Signed)
Tried calling the pt for more info- had to leave a vm.   Do you have any suggestions?

## 2021-04-16 NOTE — Telephone Encounter (Signed)
Pt scheduled with [facility] tomorrow.

## 2021-04-17 ENCOUNTER — Ambulatory Visit (INDEPENDENT_AMBULATORY_CARE_PROVIDER_SITE_OTHER): Payer: Medicare Other | Admitting: Family Medicine

## 2021-04-17 ENCOUNTER — Encounter: Payer: Self-pay | Admitting: Family Medicine

## 2021-04-17 VITALS — BP 130/84 | HR 71 | Temp 97.7°F | Ht 65.0 in | Wt 186.4 lb

## 2021-04-17 DIAGNOSIS — S99929A Unspecified injury of unspecified foot, initial encounter: Secondary | ICD-10-CM

## 2021-04-17 DIAGNOSIS — L089 Local infection of the skin and subcutaneous tissue, unspecified: Secondary | ICD-10-CM | POA: Diagnosis not present

## 2021-04-17 MED ORDER — DOXYCYCLINE HYCLATE 100 MG PO TABS: 100.0000 mg | ORAL_TABLET | Freq: Two times a day (BID) | ORAL | 0 refills | Status: AC

## 2021-04-17 NOTE — Patient Instructions (Addendum)
Do not shower for the rest of the day. When you do wash it, use only soap and water. Do not vigorously scrub. Apply triple antibiotic ointment (like Neosporin) twice daily. Keep the area clean and dry.   Things to look out for: increasing pain not relieved by ibuprofen/acetaminophen, fevers, spreading redness, drainage of pus, or foul odor.  Take the dressing off tomorrow.   Betadine or iodine soaks twice daily for 10-15 minutes at a time. This is very important!  Let us know if you need anything.

## 2021-04-17 NOTE — Progress Notes (Addendum)
Chief Complaint  Patient presents with   Procedure    Nicole Brennan is a 81 y.o. female here for a nail complaint.  Duration: 2 days Location: L great toe Pruritic? No Painful? Yes Drainage? No Other associated symptoms: caught toenail overnight while sleeping and now loose/hanging Therapies tried thus far: none  Past Medical History:  Diagnosis Date   Allergy    Arthritis    Cataract    History of radiation therapy 09/30/2018-10/28/2018   Vaginal cuff HDR; Dr. Gery Pray   Thyroid disease     BP 130/84    Pulse 71    Temp 97.7 F (36.5 C) (Oral)    Ht 5\' 5"  (1.651 m)    Wt 186 lb 6 oz (84.5 kg)    SpO2 96%    BMI 31.01 kg/m  Gen: awake, alert, appearing stated age Lungs: No accessory muscle use Skin: L great toenail is starting to separate, causes some pain. Some purulent drainage noted underneath nail plate during removal. No erythema, excessive warmth, fluctuance, excoriation Psych: Age appropriate judgment and insight  Procedure note; complete toenail removal Informed consent obtained. Indication: nail injury/separation from nail bed  The area was anesthetized with a four corner ring block using 1.5 mL of 2% lidocaine without epinephrine in each quadrant, a total of 6 mL. We had to re-anesthetize with 1 mL on each side to ensure adequate anesthesia.  A tourniquet was placed around the base of the toe. Several minutes past while the initial anesthesia started to work. A straight hemostat was used to elevate the nail bed and disrupt the matrix though most of it was already elevated from her injury.  The straight hemostat was then use to wrest the nail from place. Adequate hemostasis was obtained and the tourniquet was removed. A pressure wrap was placed.  The patient tolerated the procedure well. There were no complications noted.  Injury of great toenail - Plan: PR REMOVAL OF NAIL PLATE  Skin infection - Plan: doxycycline (VIBRA-TABS) 100 MG tablet  Will remove  nail, allow it to grow back as she has not had much growth. Aftercare instructions verbalized and written down. Warning signs and symptoms verbalized and written down in AVS.  Given purulent discharge, will tx w 7 d of doxy.  F/u in 1 week to reck. The patient voiced understanding and agreement to the plan.  [place], DO 04/17/21 4:28 PM

## 2021-04-17 NOTE — Addendum Note (Signed)
Addended by: Ames Coupe on: 04/17/2021 04:28 PM   Modules accepted: Orders

## 2021-04-18 ENCOUNTER — Telehealth: Payer: Self-pay | Admitting: Family Medicine

## 2021-04-18 NOTE — Telephone Encounter (Signed)
Spoke to the patient and she is having no pain. She has taken only 2 tylenol. Getting ready to remove bandage and will clean/soak twice today and start antibiotic. Informed her to call if she needs anything at all.  She agreed to do so and appreciated the call. She stated Dr. Nani Ravens did a great job!!

## 2021-04-18 NOTE — Telephone Encounter (Signed)
Called the patient left a detailed message to call back to see how she is doing today since toe nail removal on 04/17/21.

## 2021-04-25 ENCOUNTER — Encounter: Payer: Self-pay | Admitting: Family Medicine

## 2021-04-25 ENCOUNTER — Ambulatory Visit (INDEPENDENT_AMBULATORY_CARE_PROVIDER_SITE_OTHER): Payer: Medicare Other | Admitting: Family Medicine

## 2021-04-25 DIAGNOSIS — Z09 Encounter for follow-up examination after completed treatment for conditions other than malignant neoplasm: Secondary | ICD-10-CM

## 2021-04-25 NOTE — Patient Instructions (Signed)
This looks good. I would soak twice daily until the weekend and then stop.  You can use nail polish on the nail once it starts growing in.   Let us know if you need anything.

## 2021-04-25 NOTE — Progress Notes (Signed)
Chief Complaint  Patient presents with   Follow-up    Toe procedure    Patient had a toenail removal on her left great toe last week.  She has been soaking in Betadine twice daily as requested.  She is not having any pain.  There is no drainage, redness, or fevers.  Healing well as of today.  She is interested in knowing if she can put polish on the nail as it grows in.  I told her yes.  Do not put an acrylic nail on as it grows as this may look strange.  Follow-up as originally scheduled with her regular PCP.  She voiced understanding and agreement to the plan.  Crosby Oyster Taishawn Smaldone 2:16 PM 04/25/21

## 2021-05-07 ENCOUNTER — Other Ambulatory Visit: Payer: Self-pay | Admitting: Family Medicine

## 2021-05-07 DIAGNOSIS — E038 Other specified hypothyroidism: Secondary | ICD-10-CM

## 2021-06-18 ENCOUNTER — Other Ambulatory Visit (HOSPITAL_BASED_OUTPATIENT_CLINIC_OR_DEPARTMENT_OTHER): Payer: Self-pay | Admitting: Family Medicine

## 2021-06-18 DIAGNOSIS — Z1231 Encounter for screening mammogram for malignant neoplasm of breast: Secondary | ICD-10-CM

## 2021-07-11 DIAGNOSIS — Z20822 Contact with and (suspected) exposure to covid-19: Secondary | ICD-10-CM | POA: Diagnosis not present

## 2021-08-08 DIAGNOSIS — H16223 Keratoconjunctivitis sicca, not specified as Sjogren's, bilateral: Secondary | ICD-10-CM | POA: Diagnosis not present

## 2021-08-20 ENCOUNTER — Encounter (HOSPITAL_BASED_OUTPATIENT_CLINIC_OR_DEPARTMENT_OTHER): Payer: Self-pay

## 2021-08-20 ENCOUNTER — Ambulatory Visit (HOSPITAL_BASED_OUTPATIENT_CLINIC_OR_DEPARTMENT_OTHER)
Admission: RE | Admit: 2021-08-20 | Discharge: 2021-08-20 | Disposition: A | Discharge status: Home / Self Care | Payer: Medicare Other | Source: Ambulatory Visit | Attending: Family Medicine | Admitting: Family Medicine

## 2021-08-20 DIAGNOSIS — Z1231 Encounter for screening mammogram for malignant neoplasm of breast: Secondary | ICD-10-CM | POA: Insufficient documentation

## 2021-09-06 ENCOUNTER — Telehealth (HOSPITAL_BASED_OUTPATIENT_CLINIC_OR_DEPARTMENT_OTHER): Payer: Self-pay

## 2021-09-18 NOTE — Progress Notes (Unsigned)
[place] at [facility] [address], [place], [place] [address] [phone]  Date:  09/19/2021   Name:  Nicole Brennan   DOB:  May 09, 1940   MRN:  [id number]  PCP:  Darreld Mclean, MD    Chief Complaint: No chief complaint on file.   History of Present Illness:  Nicole Brennan is a 81 y.o. very pleasant female patient who presents with the following:  Nicole Brennan is seen today for periodic follow-up History of hypothyroidism, osteoporosis, endometrial cancer dx 07/2018 Most recent visit with myself about 1 year ago  In the interim she saw my partner Dr. Nani Ravens for toenail problem  She also followed up with GynOnc in January, Dr. Jolyn Lent with endometrial cancer in May 2020, underwent hysterectomy in June at the same year.  She was also treated with radiation Assessment & Plan: Nicole Brennan is a 81 y.o. woman with stage IB grade 1 endometrial cancer.  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 S/p adjuvant radiation (vaginal brachytherapy) - completed 10/28/18. No evidence of disease.  Patient is overall doing very well.  Per NCCN surveillance recommendations, I recommend she follow-up at 6 month intervals until 5 years out from completion of adjuvant treatment.  This will continue to alternate between our office and radiation oncology.  She is scheduled to see Dr. Sondra Come in August.  I have asked her to call back at the end of the year to schedule a visit with me in January or February 2024.  Nicole Brennan booster DEXA scan can be updated Has completed her pneumonia Update labs today, labs done 1 year ago Cologuard completed 2021, negative Mammogram completed last month  Levothyroxine 75 Patient Active Problem List   Diagnosis Date Noted   Prediabetes 69/62/9528   Lichen sclerosus 41/32/4401   Endometrial carcinoma ([place]) 08/05/2018   Obesity (BMI 30.0-34.9)  08/05/2018   Seasonal allergies 04/21/2012   Cataract 04/21/2012   Osteoporosis 05/01/2011   Hypothyroidism 04/30/2011   Osteoarthritis 04/30/2011    Past Medical History:  Diagnosis Date   Allergy    Arthritis    Cataract    History of radiation therapy 09/30/2018-10/28/2018   Vaginal cuff HDR; Dr. Gery Pray   Thyroid disease     Past Surgical History:  Procedure Laterality Date   DG  BONE DENSITY ([place] HX)     KNEE SURGERY     ROBOTIC ASSISTED TOTAL HYSTERECTOMY WITH BILATERAL SALPINGO OOPHERECTOMY Bilateral 08/20/2018   Procedure: XI ROBOTIC ASSISTED TOTAL HYSTERECTOMY WITH BILATERAL SALPINGO OOPHORECTOMY;  Surgeon: Everitt Amber, MD;  Location: [facility];  Service: Gynecology;  Laterality: Bilateral;   SENTINEL NODE BIOPSY N/A 08/20/2018   Procedure: SENTINEL LYMPH  NODE BIOPSY;  Surgeon: Everitt Amber, MD;  Location: [facility];  Service: Gynecology;  Laterality: N/A;   TUBAL LIGATION      Social History   Tobacco Use   Smoking status: Never   Smokeless tobacco: Never  Vaping Use   Vaping Use: Never used  Substance Use Topics   Alcohol use: No   Drug use: No    Family History  Problem Relation Age of Onset   Vascular Disease Mother    Colon cancer Mother    Dementia Father     Allergies  Allergen Reactions   Penicillins Hives    Did it involve swelling of the face/tongue/throat, SOB, or low BP? No Did it  involve sudden or severe rash/hives, skin peeling, or any reaction on the inside of your mouth or nose? Unknown Did you need to seek medical attention at a hospital or doctor's office? Yes When did it last happen?Childhood reaction If all above answers are "NO", may proceed with cephalosporin use.     Medication list has been reviewed and updated.  Current Outpatient Medications on File Prior to Visit  Medication Sig Dispense Refill   acetaminophen (TYLENOL) 500 MG tablet Take 1,000 mg by mouth every 6 (six) hours as needed for headache.     Ascorbic Acid  (VITAMIN C PO) Take by mouth.     Calcium-Phosphorus-Vitamin D [phone] MG-MG-UNIT CHEW Chew 2 tablets by mouth at bedtime.      carboxymethylcellul-glycerin (REFRESH OPTIVE) 0.5-0.9 % ophthalmic solution Place 1 drop into both eyes 2 (two) times daily as needed for dry eyes.     levothyroxine (SYNTHROID) 75 MCG tablet TAKE 1 TABLET BY MOUTH EVERY DAY 90 tablet 3   Lubricants (ASTROGLIDE) GEL Apply 1 applicator topically as needed. 66.5 g 3   Multiple Vitamins-Minerals (MULTIVITAMIN WITH MINERALS) tablet Take 1 tablet by mouth every evening. Centrum Silver     No current facility-administered medications on file prior to visit.    Review of Systems:  As per HPI- otherwise negative.   Physical Examination: There were no vitals filed for this visit. There were no vitals filed for this visit. There is no height or weight on file to calculate BMI. Ideal Body Weight:    GEN: no acute distress. HEENT: Atraumatic, Normocephalic.  Ears and Nose: No external deformity. CV: RRR, No M/G/R. No JVD. No thrill. No extra heart sounds. PULM: CTA B, no wheezes, crackles, rhonchi. No retractions. No resp. distress. No accessory muscle use. ABD: S, NT, ND, +BS. No rebound. No HSM. EXTR: No c/c/e PSYCH: Normally interactive. Conversant.    Assessment and Plan: ***  Signed Lamar Blinks, MD

## 2021-09-18 NOTE — Patient Instructions (Incomplete)
It was great to see you again today, I will be in touch with your labs Assuming all is well please see me in about 6 months Please consider getting the Shingrix series and COVID booster at your pharmacy  Try using some ear wax removal drops at home periodically   I would suggest getting some move walking in your routine- you might try going to a large store like [employer] or [employer] and walking for 20 minutes or so a few times a week

## 2021-09-19 ENCOUNTER — Ambulatory Visit (INDEPENDENT_AMBULATORY_CARE_PROVIDER_SITE_OTHER): Payer: Medicare Other | Admitting: Family Medicine

## 2021-09-19 ENCOUNTER — Encounter: Payer: Self-pay | Admitting: Family Medicine

## 2021-09-19 VITALS — BP 131/71 | HR 72 | Temp 98.3°F | Resp 20 | Ht 65.0 in | Wt 184.0 lb

## 2021-09-19 DIAGNOSIS — E2839 Other primary ovarian failure: Secondary | ICD-10-CM | POA: Diagnosis not present

## 2021-09-19 DIAGNOSIS — E038 Other specified hypothyroidism: Secondary | ICD-10-CM

## 2021-09-19 DIAGNOSIS — Z131 Encounter for screening for diabetes mellitus: Secondary | ICD-10-CM

## 2021-09-19 DIAGNOSIS — Z13220 Encounter for screening for lipoid disorders: Secondary | ICD-10-CM

## 2021-09-19 DIAGNOSIS — R7309 Other abnormal glucose: Secondary | ICD-10-CM | POA: Diagnosis not present

## 2021-09-19 DIAGNOSIS — Z130 Encounter for screening for diseases of the blood and blood-forming organs and certain disorders involving the immune mechanism: Secondary | ICD-10-CM

## 2021-09-19 DIAGNOSIS — Z5181 Encounter for therapeutic drug level monitoring: Secondary | ICD-10-CM | POA: Diagnosis not present

## 2021-09-19 LAB — LIPID PANEL
Cholesterol: 187 mg/dL (ref 0–200)
HDL: 56.3 mg/dL (ref >39.00)
LDL Cholesterol: 105 mg/dL — ABNORMAL HIGH (ref 0–99)
NonHDL: 130.88
Total CHOL/HDL Ratio: 3
Triglycerides: 131 mg/dL (ref 0.0–149.0)
VLDL: 26.2 mg/dL (ref 0.0–40.0)

## 2021-09-19 LAB — CBC
HCT: 38.4 % (ref 36.0–46.0)
Hemoglobin: 12.8 g/dL (ref 12.0–15.0)
MCHC: 33.4 g/dL (ref 30.0–36.0)
MCV: 97.5 fl (ref 78.0–100.0)
Platelets: 303 10*3/uL (ref 150.0–400.0)
RBC: 3.94 Mil/uL (ref 3.87–5.11)
RDW: 13.6 % (ref 11.5–15.5)
WBC: 4.8 10*3/uL (ref 4.0–10.5)

## 2021-09-19 LAB — COMPREHENSIVE METABOLIC PANEL
ALT: 19 U/L (ref 0–35)
AST: 22 U/L (ref 0–37)
Albumin: 4.3 g/dL (ref 3.5–5.2)
Alkaline Phosphatase: 61 U/L (ref 39–117)
BUN: 12 mg/dL (ref 6–23)
CO2: 29 mEq/L (ref 19–32)
Calcium: 10 mg/dL (ref 8.4–10.5)
Chloride: 106 mEq/L (ref 96–112)
Creatinine, Ser: 0.93 mg/dL (ref 0.40–1.20)
GFR: 58.43 mL/min — ABNORMAL LOW (ref >60.00)
Glucose, Bld: 92 mg/dL (ref 70–99)
Potassium: 4.3 mEq/L (ref 3.5–5.1)
Sodium: 142 mEq/L (ref 135–145)
Total Bilirubin: 0.6 mg/dL (ref 0.2–1.2)
Total Protein: 6.6 g/dL (ref 6.0–8.3)

## 2021-09-19 LAB — HEMOGLOBIN A1C: Hgb A1c MFr Bld: 5.9 % (ref 4.6–6.5)

## 2021-09-19 LAB — TSH: TSH: 3.68 u[IU]/mL (ref 0.35–5.50)

## 2021-09-26 ENCOUNTER — Telehealth (HOSPITAL_BASED_OUTPATIENT_CLINIC_OR_DEPARTMENT_OTHER): Payer: Self-pay

## 2021-10-04 ENCOUNTER — Telehealth: Payer: Self-pay | Admitting: Family Medicine

## 2021-10-04 NOTE — Telephone Encounter (Signed)
Left message for patient to call back and schedule Medicare Annual Wellness Visit (AWV).   Please offer to do virtually or by telephone.  Left office number and my jabber [phone].  Last AWV:10/17/2020  Please schedule at anytime with Nurse Health Advisor.

## 2021-11-04 NOTE — Progress Notes (Signed)
Radiation Oncology         [phone] ________________________________  Name: Nicole Brennan MRN: [id number]  Date: 11/05/2021  DOB: 04-25-1940  Follow-Up Visit Note  CC: Copland, Gay Filler, MD  Copland, Gay Filler, MD  No diagnosis found.  Diagnosis: FIGO Stage I-B, grade 1 endometrioid adenocarcinoma, pT1b, pN0  Interval Since Last Radiation: 3 years and 9 days   Radiation Treatment Dates: 09/30/2018, 10/07/2018, 10/14/2018, 10/21/2018, 10/28/2018:  Site/Dose: Vaginal Cuff (Brachytherapy) / 30 Gy in 5 fractions  Narrative:  The patient returns today for routine annual follow-up, she was last seen here for follow-up on 10/30/20. Since her last visit,  the patient followed up with Dr. Berline Lopes on 04/09/21. During which time, the patient endorsed baseline urinary frequency but otherwise denied any symptoms concerning for disease recurrence and was noted as NED on examination.        Pertinent imaging performed in the interval includes a bilateral screening mammogram on 08/20/21 which showed no evidence of malignancy in either breast.        ***                    Allergies:  is allergic to penicillins.  Meds: Current Outpatient Medications  Medication Sig Dispense Refill   acetaminophen (TYLENOL) 500 MG tablet Take 1,000 mg by mouth every 6 (six) hours as needed for headache.     Ascorbic Acid (VITAMIN C PO) Take by mouth.     Calcium-Phosphorus-Vitamin D [phone] MG-MG-UNIT CHEW Chew 2 tablets by mouth at bedtime.      carboxymethylcellul-glycerin (REFRESH OPTIVE) 0.5-0.9 % ophthalmic solution Place 1 drop into both eyes 2 (two) times daily as needed for dry eyes.     levothyroxine (SYNTHROID) 75 MCG tablet TAKE 1 TABLET BY MOUTH EVERY DAY 90 tablet 3   Lubricants (ASTROGLIDE) GEL Apply 1 applicator topically as needed. 66.5 g 3   Multiple Vitamins-Minerals (MULTIVITAMIN WITH MINERALS) tablet Take 1 tablet by mouth every evening. Centrum Silver     No current  facility-administered medications for this encounter.    Physical Findings: The patient is in no acute distress. Patient is alert and oriented.  vitals were not taken for this visit. .  No significant changes. Lungs are clear to auscultation bilaterally. Heart has regular rate and rhythm. No palpable cervical, supraclavicular, or axillary adenopathy. Abdomen soft, non-tender, normal bowel sounds.  On pelvic examination the external genitalia were unremarkable. A speculum exam was performed. There are no mucosal lesions noted in the vaginal vault. A Pap smear was obtained of the proximal vagina. On bimanual and rectovaginal examination there were no pelvic masses appreciated. ***    Lab Findings: Lab Results  Component Value Date   WBC 4.8 09/19/2021   HGB 12.8 09/19/2021   HCT 38.4 09/19/2021   MCV 97.5 09/19/2021   PLT 303.0 09/19/2021    Radiographic Findings: No results found.  Impression: FIGO Stage I-B, grade 1 endometrioid adenocarcinoma, pT1b, pN0  The patient is recovering from the effects of radiation.  ***  Plan:  ***   *** minutes of total time was spent for this patient encounter, including preparation, face-to-face counseling with the patient and coordination of care, physical exam, and documentation of the encounter. ____________________________________  Blair Promise, PhD, MD  This document serves as a record of services personally performed by Gery Pray, MD. It was created on his behalf by Roney Mans, a trained medical scribe. The creation of this record is based on  the scribe's personal observations and the provider's statements to them. This document has been checked and approved by the attending provider.

## 2021-11-05 ENCOUNTER — Other Ambulatory Visit: Payer: Self-pay

## 2021-11-05 ENCOUNTER — Ambulatory Visit
Admission: RE | Admit: 2021-11-05 | Discharge: 2021-11-05 | Disposition: A | Discharge status: Home / Self Care | Payer: Medicare Other | Source: Ambulatory Visit | Attending: Radiation Oncology | Admitting: Radiation Oncology

## 2021-11-05 ENCOUNTER — Telehealth: Payer: Self-pay | Admitting: Family Medicine

## 2021-11-05 DIAGNOSIS — Z923 Personal history of irradiation: Secondary | ICD-10-CM | POA: Insufficient documentation

## 2021-11-05 DIAGNOSIS — C541 Malignant neoplasm of endometrium: Secondary | ICD-10-CM | POA: Diagnosis not present

## 2021-11-05 DIAGNOSIS — Z79890 Hormone replacement therapy (postmenopausal): Secondary | ICD-10-CM | POA: Diagnosis not present

## 2021-11-05 DIAGNOSIS — Z8542 Personal history of malignant neoplasm of other parts of uterus: Secondary | ICD-10-CM | POA: Insufficient documentation

## 2021-11-05 NOTE — Telephone Encounter (Signed)
Left message for patient to call back and schedule Medicare Annual Wellness Visit (AWV).   Please offer to do virtually or by telephone.  Left office number and my jabber [phone].  Last AWV:10/17/2020  Please schedule at anytime with Nurse Health Advisor.

## 2021-11-05 NOTE — Progress Notes (Signed)
Nicole Brennan is here today for follow up post radiation to the pelvic.  They completed their radiation on: 10/28/2018   Does the patient complain of any of the following:  Pain:no Abdominal bloating: no Diarrhea/Constipation: no Nausea/Vomiting: no Vaginal Discharge: no Blood in Urine or Stool: no Urinary Issues (dysuria/incomplete emptying/ incontinence/ increased frequency/urgency): denies any issues but does get up 2 times a night to urinate. Does patient report using vaginal dilator 2-3 times a week and/or sexually active 2-3 weeks: no Post radiation skin changes: no   Additional comments if applicable: None noted.   BP (!) 142/61 (BP Location: Left Arm, Patient Position: Sitting, Cuff Size: Normal)   Pulse 63   Temp (!) 97.5 F (36.4 C)   Resp 20   Ht '5\' 5"'$  (1.651 m)   Wt 185 lb 12.8 oz (84.3 kg)   SpO2 98%   BMI 30.92 kg/m

## 2021-11-13 DIAGNOSIS — M533 Sacrococcygeal disorders, not elsewhere classified: Secondary | ICD-10-CM | POA: Diagnosis not present

## 2021-11-29 ENCOUNTER — Other Ambulatory Visit (HOSPITAL_BASED_OUTPATIENT_CLINIC_OR_DEPARTMENT_OTHER): Payer: PRIVATE HEALTH INSURANCE

## 2021-11-29 ENCOUNTER — Encounter: Payer: Self-pay | Admitting: Family Medicine

## 2021-11-29 ENCOUNTER — Ambulatory Visit (HOSPITAL_BASED_OUTPATIENT_CLINIC_OR_DEPARTMENT_OTHER)
Admission: RE | Admit: 2021-11-29 | Discharge: 2021-11-29 | Disposition: A | Discharge status: Home / Self Care | Payer: Medicare Other | Source: Ambulatory Visit | Attending: Family Medicine | Admitting: Family Medicine

## 2021-11-29 DIAGNOSIS — E2839 Other primary ovarian failure: Secondary | ICD-10-CM | POA: Diagnosis not present

## 2021-11-29 DIAGNOSIS — M85852 Other specified disorders of bone density and structure, left thigh: Secondary | ICD-10-CM | POA: Diagnosis not present

## 2021-12-03 ENCOUNTER — Ambulatory Visit (INDEPENDENT_AMBULATORY_CARE_PROVIDER_SITE_OTHER): Payer: Medicare Other

## 2021-12-03 VITALS — Ht 65.0 in | Wt 185.0 lb

## 2021-12-03 DIAGNOSIS — Z0000 Encounter for general adult medical examination without abnormal findings: Secondary | ICD-10-CM | POA: Diagnosis not present

## 2021-12-03 NOTE — Patient Instructions (Signed)
Ms. Bamba , Thank you for taking time to complete your Medicare Wellness Visit. I appreciate your ongoing commitment to your health goals. Please review the following plan we discussed and let me know if I can assist you in the future.   Screening recommendations/referrals: Colonoscopy: No longer required Mammogram: Completed 08/20/2021-Due 08/21/2022 Bone Density: Completed 11/29/2021-Due 11/30/2023 Recommended yearly ophthalmology/optometry visit for glaucoma screening and checkup Recommended yearly dental visit for hygiene and checkup  Vaccinations: Influenza vaccine: Due-May obtain vaccine at our office or your local pharmacy. Pneumococcal vaccine: Up to date Tdap vaccine: Up to date Shingles vaccine: Due-May obtain vaccine at your local pharmacy. Covid-19:May obtain vaccine at your local pharmacy.  Advanced directives: Please bring a copy of Living Will and/or Healthcare Power of Attorney for your chart.   Conditions/risks identified: See problem list  Next appointment: Follow up in one year for your annual wellness visit    Preventive Care 65 Years and Older, Female Preventive care refers to lifestyle choices and visits with your health care provider that can promote health and wellness. What does preventive care include? A yearly physical exam. This is also called an annual well check. Dental exams once or twice a year. Routine eye exams. Ask your health care provider how often you should have your eyes checked. Personal lifestyle choices, including: Daily care of your teeth and gums. Regular physical activity. Eating a healthy diet. Avoiding tobacco and drug use. Limiting alcohol use. Practicing safe sex. Taking low-dose aspirin every day. Taking vitamin and mineral supplements as recommended by your health care provider. What happens during an annual well check? The services and screenings done by your health care provider during your annual well check will depend on your  age, overall health, lifestyle risk factors, and family history of disease. Counseling  Your health care provider may ask you questions about your: Alcohol use. Tobacco use. Drug use. Emotional well-being. Home and relationship well-being. Sexual activity. Eating habits. History of falls. Memory and ability to understand (cognition). Work and work Statistician. Reproductive health. Screening  You may have the following tests or measurements: Height, weight, and BMI. Blood pressure. Lipid and cholesterol levels. These may be checked every 5 years, or more frequently if you are over 94 years old. Skin check. Lung cancer screening. You may have this screening every year starting at age 63 if you have a 30-pack-year history of smoking and currently smoke or have quit within the past 15 years. Fecal occult blood test (FOBT) of the stool. You may have this test every year starting at age 54. Flexible sigmoidoscopy or colonoscopy. You may have a sigmoidoscopy every 5 years or a colonoscopy every 10 years starting at age 48. Hepatitis C blood test. Hepatitis B blood test. Sexually transmitted disease (STD) testing. Diabetes screening. This is done by checking your blood sugar (glucose) after you have not eaten for a while (fasting). You may have this done every 1-3 years. Bone density scan. This is done to screen for osteoporosis. You may have this done starting at age 26. Mammogram. This may be done every 1-2 years. Talk to your health care provider about how often you should have regular mammograms. Talk with your health care provider about your test results, treatment options, and if necessary, the need for more tests. Vaccines  Your health care provider may recommend certain vaccines, such as: Influenza vaccine. This is recommended every year. Tetanus, diphtheria, and acellular pertussis (Tdap, Td) vaccine. You may need a Td booster every 10  years. Zoster vaccine. You may need this after  age 4. Pneumococcal 13-valent conjugate (PCV13) vaccine. One dose is recommended after age 32. Pneumococcal polysaccharide (PPSV23) vaccine. One dose is recommended after age 64. Talk to your health care provider about which screenings and vaccines you need and how often you need them. This information is not intended to replace advice given to you by your health care provider. Make sure you discuss any questions you have with your health care provider. Document Released: 03/24/2015 Document Revised: 11/15/2015 Document Reviewed: 12/27/2014 Elsevier Interactive Patient Education  2017 [place] Prevention in the Home Falls can cause injuries. They can happen to people of all ages. There are many things you can do to make your home safe and to help prevent falls. What can I do on the outside of my home? Regularly fix the edges of walkways and driveways and fix any cracks. Remove anything that might make you trip as you walk through a door, such as a raised step or threshold. Trim any bushes or trees on the path to your home. Use bright outdoor lighting. Clear any walking paths of anything that might make someone trip, such as rocks or tools. Regularly check to see if handrails are loose or broken. Make sure that both sides of any steps have handrails. Any raised decks and porches should have guardrails on the edges. Have any leaves, snow, or ice cleared regularly. Use sand or salt on walking paths during winter. Clean up any spills in your garage right away. This includes oil or grease spills. What can I do in the bathroom? Use night lights. Install grab bars by the toilet and in the tub and shower. Do not use towel bars as grab bars. Use non-skid mats or decals in the tub or shower. If you need to sit down in the shower, use a plastic, non-slip stool. Keep the floor dry. Clean up any water that spills on the floor as soon as it happens. Remove soap buildup in the tub or shower  regularly. Attach bath mats securely with double-sided non-slip rug tape. Do not have throw rugs and other things on the floor that can make you trip. What can I do in the bedroom? Use night lights. Make sure that you have a light by your bed that is easy to reach. Do not use any sheets or blankets that are too big for your bed. They should not hang down onto the floor. Have a firm chair that has side arms. You can use this for support while you get dressed. Do not have throw rugs and other things on the floor that can make you trip. What can I do in the kitchen? Clean up any spills right away. Avoid walking on wet floors. Keep items that you use a lot in easy-to-reach places. If you need to reach something above you, use a strong step stool that has a grab bar. Keep electrical cords out of the way. Do not use floor polish or wax that makes floors slippery. If you must use wax, use non-skid floor wax. Do not have throw rugs and other things on the floor that can make you trip. What can I do with my stairs? Do not leave any items on the stairs. Make sure that there are handrails on both sides of the stairs and use them. Fix handrails that are broken or loose. Make sure that handrails are as long as the stairways. Check any carpeting to make sure  that it is firmly attached to the stairs. Fix any carpet that is loose or worn. Avoid having throw rugs at the top or bottom of the stairs. If you do have throw rugs, attach them to the floor with carpet tape. Make sure that you have a light switch at the top of the stairs and the bottom of the stairs. If you do not have them, ask someone to add them for you. What else can I do to help prevent falls? Wear shoes that: Do not have high heels. Have rubber bottoms. Are comfortable and fit you well. Are closed at the toe. Do not wear sandals. If you use a stepladder: Make sure that it is fully opened. Do not climb a closed stepladder. Make sure that  both sides of the stepladder are locked into place. Ask someone to hold it for you, if possible. Clearly mark and make sure that you can see: Any grab bars or handrails. First and last steps. Where the edge of each step is. Use tools that help you move around (mobility aids) if they are needed. These include: Canes. Walkers. Scooters. Crutches. Turn on the lights when you go into a dark area. Replace any light bulbs as soon as they burn out. Set up your furniture so you have a clear path. Avoid moving your furniture around. If any of your floors are uneven, fix them. If there are any pets around you, be aware of where they are. Review your medicines with your doctor. Some medicines can make you feel dizzy. This can increase your chance of falling. Ask your doctor what other things that you can do to help prevent falls. This information is not intended to replace advice given to you by your health care provider. Make sure you discuss any questions you have with your health care provider. Document Released: 12/22/2008 Document Revised: 08/03/2015 Document Reviewed: 04/01/2014 Elsevier Interactive Patient Education  2017 [employer].

## 2021-12-03 NOTE — Progress Notes (Signed)
Subjective:   Nicole Brennan is a 81 y.o. female who presents for Medicare Annual (Subsequent) preventive examination.  I connected with Sherell today by telephone and verified that I am speaking with the correct person using two identifiers. Location patient: home Location provider: work Persons participating in the virtual visit: patient, Marine scientist.    I discussed the limitations, risks, security and privacy concerns of performing an evaluation and management service by telephone and the availability of in person appointments. I also discussed with the patient that there may be a patient responsible charge related to this service. The patient expressed understanding and verbally consented to this telephonic visit.    Interactive audio and video telecommunications were attempted between this provider and patient, however failed, due to patient having technical difficulties OR patient did not have access to video capability.  We continued and completed visit with audio only.  Some vital signs may be absent or patient reported.   Time Spent with patient on telephone encounter: 20 minutes   Review of Systems     Cardiac Risk Factors include: advanced age (>42mn, >>57women);obesity (BMI >30kg/m2)     Objective:    Today's Vitals   12/03/21 0910  Weight: 185 lb (83.9 kg)  Height: '5\' 5"'$  (1.651 m)   Body mass index is 30.79 kg/m.     12/03/2021    9:13 AM 11/05/2021   10:53 AM 10/30/2020   11:08 AM 10/17/2020    8:24 AM 05/04/2020    8:45 AM 01/27/2020    4:02 PM 10/25/2019    2:45 PM  Advanced Directives  Does Patient Have a Medical Advance Directive? Yes Yes Yes Yes Yes Yes Yes  Type of AParamedicof AHampsteadLiving will HAcampoLiving will HUnion DepositLiving will HWalnut CreekLiving will HMaria AntoniaLiving will HShippingportLiving will HIsland HeightsLiving will   Does patient want to make changes to medical advance directive?   No - Patient declined   No - Patient declined No - Patient declined  Copy of H[place]in Chart? No - copy requested  No - copy requested  No - copy requested No - copy requested     Current Medications (verified) Outpatient Encounter Medications as of 12/03/2021  Medication Sig   acetaminophen (TYLENOL) 500 MG tablet Take 1,000 mg by mouth every 6 (six) hours as needed for headache.   Ascorbic Acid (VITAMIN C PO) Take by mouth.   Calcium-Phosphorus-Vitamin D 2169-678-938MG-MG-UNIT CHEW Chew 2 tablets by mouth at bedtime.    carboxymethylcellul-glycerin (REFRESH OPTIVE) 0.5-0.9 % ophthalmic solution Place 1 drop into both eyes 2 (two) times daily as needed for dry eyes.   levothyroxine (SYNTHROID) 75 MCG tablet TAKE 1 TABLET BY MOUTH EVERY DAY   Lubricants (ASTROGLIDE) GEL Apply 1 applicator topically as needed.   Multiple Vitamins-Minerals (MULTIVITAMIN WITH MINERALS) tablet Take 1 tablet by mouth every evening. Centrum Silver   No facility-administered encounter medications on file as of 12/03/2021.    Allergies (verified) Penicillins   History: Past Medical History:  Diagnosis Date   Allergy    Arthritis    Cataract    History of radiation therapy 09/30/2018-10/28/2018   Vaginal cuff HDR; Dr. JGery Pray  Thyroid disease    Past Surgical History:  Procedure Laterality Date   DG  BONE DENSITY ([place])     KNEE SURGERY     ROBOTIC ASSISTED TOTAL  HYSTERECTOMY WITH BILATERAL SALPINGO OOPHERECTOMY Bilateral 08/20/2018   Procedure: XI ROBOTIC ASSISTED TOTAL HYSTERECTOMY WITH BILATERAL SALPINGO OOPHORECTOMY;  Surgeon: Everitt Amber, MD;  Location: [facility];  Service: Gynecology;  Laterality: Bilateral;   SENTINEL NODE BIOPSY N/A 08/20/2018   Procedure: SENTINEL LYMPH  NODE BIOPSY;  Surgeon: Everitt Amber, MD;  Location: [facility];  Service: Gynecology;  Laterality: N/A;   TUBAL LIGATION     Family  History  Problem Relation Age of Onset   Vascular Disease Mother    Colon cancer Mother    Dementia Father    Social History   Socioeconomic History   Marital status: Married    Spouse name: Not on file   Number of children: Not on file   Years of education: Not on file   Highest education level: Not on file  Occupational History   Not on file  Tobacco Use   Smoking status: Never   Smokeless tobacco: Never  Vaping Use   Vaping Use: Never used  Substance and Sexual Activity   Alcohol use: No   Drug use: No   Sexual activity: Yes  Other Topics Concern   Not on file  Social History Narrative   Not on file   Social Determinants of Health   Financial Resource Strain: Low Risk  (12/03/2021)   Overall Financial Resource Strain (CARDIA)    Difficulty of Paying Living Expenses: Not hard at all  Food Insecurity: No Food Insecurity (12/03/2021)   Hunger Vital Sign    Worried About Running Out of Food in the Last Year: Never true    [place] in the Last Year: Never true  Transportation Needs: No Transportation Needs (12/03/2021)   PRAPARE - Hydrologist (Medical): No    Lack of Transportation (Non-Medical): No  Physical Activity: Inactive (10/17/2020)   Exercise Vital Sign    Days of Exercise per Week: 0 days    Minutes of Exercise per Session: 0 min  Stress: No Stress Concern Present (10/17/2020)   [place]    Feeling of Stress : Not at all  Social Connections: Moderately Integrated (10/17/2020)   Social Connection and Isolation Panel [NHANES]    Frequency of Communication with Friends and Family: More than three times a week    Frequency of Social Gatherings with Friends and Family: More than three times a week    Attends Religious Services: More than 4 times per year    Active Member of [employer] or Organizations: No    Attends Music therapist: Never    Marital  Status: Married    Tobacco Counseling Counseling given: Not Answered   Clinical Intake:  Pre-visit preparation completed: Yes  Pain : No/denies pain     Nutritional Status: BMI > 30  Obese Nutritional Risks: None Diabetes: No  How often do you need to have someone help you when you read instructions, pamphlets, or other written materials from your doctor or pharmacy?: 1 - Never  Diabetic?No  Interpreter Needed?: No  Information entered by :: Caroleen Hamman LPN   Activities of Daily Living    12/03/2021    9:16 AM  In your present state of health, do you have any difficulty performing the following activities:  Hearing? 0  Vision? 0  Difficulty concentrating or making decisions? 0  Walking or climbing stairs? 0  Dressing or bathing? 0  Doing errands, shopping? 0  Preparing Food  and eating ? N  Using the Toilet? N  In the past six months, have you accidently leaked urine? N  Do you have problems with loss of bowel control? N  Managing your Medications? N  Managing your Finances? N  Housekeeping or managing your Housekeeping? N    Patient Care Team: Copland, Gay Filler, MD as PCP - General (Family Medicine) Melrose Nakayama, MD as Consulting Physician (Orthopedic Surgery)  Indicate any recent Medical Services you may have received from other than [facility] providers in the past year (date may be approximate).     Assessment:   This is a routine wellness examination for Raymond.  Hearing/Vision screen Hearing Screening - Comments:: No issues Vision Screening - Comments:: Last eye exam-07/2021-Dr. York  Dietary issues and exercise activities discussed: Current Exercise Habits: The patient does not participate in regular exercise at present, Exercise limited by: None identified   Goals Addressed               This Visit's Progress     Patient Stated     Maintain current healthy lifestyle. (pt-stated)   On track      Depression Screen    12/03/2021     9:16 AM 10/17/2020    8:29 AM 09/18/2020    9:21 AM 08/10/2020   10:00 AM 10/13/2017    9:11 AM 07/01/2016   11:21 AM 09/04/2015   10:42 AM  PHQ 2/9 Scores  PHQ - 2 Score 0 0 0 0 0 0 0    Fall Risk    12/03/2021    9:15 AM 10/17/2020    8:27 AM 09/18/2020    9:21 AM 08/10/2020   10:00 AM 02/03/2019   10:21 AM  Fall Risk   Falls in the past year? 0 0 0 0 0  Comment     Emmi Telephone Survey: data to providers prior to load  Number falls in past yr: 0 0     Injury with Fall? 0 0     Follow up Falls prevention discussed Falls prevention discussed       FALL RISK PREVENTION PERTAINING TO THE HOME:  Any stairs in or around the home? No  Home free of loose throw rugs in walkways, pet beds, electrical cords, etc? Yes  Adequate lighting in your home to reduce risk of falls? Yes   ASSISTIVE DEVICES UTILIZED TO PREVENT FALLS:  Life alert? No  Use of a cane, walker or w/c? No  Grab bars in the bathroom? No  Shower chair or bench in shower? No  Elevated toilet seat or a handicapped toilet? No   TIMED UP AND GO:  Was the test performed? No . Phone visit   Cognitive Function:    07/01/2016   11:22 AM  MMSE - Mini Mental State Exam  Orientation to time 5  Orientation to Place 5  Registration 3  Attention/ Calculation 5  Recall 3  Language- name 2 objects 2  Language- repeat 1  Language- follow 3 step command 3  Language- read & follow direction 1  Write a sentence 1  Copy design 1  Total score 30        12/03/2021    9:20 AM  6CIT Screen  What Year? 0 points  What month? 0 points  What time? 0 points  Count back from 20 0 points  Months in reverse 0 points  Repeat phrase 0 points  Total Score 0 points    Immunizations Immunization History  Administered Date(s) Administered   Fluad Quad(high Dose 65+) 12/01/2018, 12/09/2019   Influenza Whole 12/10/2011   Influenza, High Dose Seasonal PF 01/22/2016, 12/26/2016, 12/30/2017   Influenza,inj,Quad PF,6+ Mos 12/21/2013,  11/15/2014   PFIZER(Purple Top)SARS-COV-2 Vaccination 03/27/2019, 04/17/2019   Pneumococcal Conjugate-13 10/25/2013   Pneumococcal Polysaccharide-23 10/15/2017   Pneumococcal-Unspecified 03/12/2003   Td 08/10/2020   Tdap 03/12/2007   Zoster, Live 03/11/2008    TDAP status: Up to date  Flu Vaccine status: Due, Education has been provided regarding the importance of this vaccine. Advised may receive this vaccine at local pharmacy or Health Dept. Aware to provide a copy of the vaccination record if obtained from local pharmacy or Health Dept. Verbalized acceptance and understanding.  Pneumococcal vaccine status: Up to date  Covid-19 vaccine status: Information provided on how to obtain vaccines.   Qualifies for Shingles Vaccine? Yes   Zostavax completed Yes   Shingrix Completed?: No.    Education has been provided regarding the importance of this vaccine. Patient has been advised to call insurance company to determine out of pocket expense if they have not yet received this vaccine. Advised may also receive vaccine at local pharmacy or Health Dept. Verbalized acceptance and understanding.  Screening Tests Health Maintenance  Topic Date Due   Zoster Vaccines- Shingrix (1 of 2) Never done   COVID-19 Vaccine (3 - Pfizer risk series) 05/15/2019   INFLUENZA VACCINE  10/09/2021   TETANUS/TDAP  08/11/2030   Pneumonia Vaccine 33+ Years old  Completed   DEXA SCAN  Completed   Hepatitis C Screening  Completed   HPV VACCINES  Aged Out    Health Maintenance  Health Maintenance Due  Topic Date Due   Zoster Vaccines- Shingrix (1 of 2) Never done   COVID-19 Vaccine (3 - Pfizer risk series) 05/15/2019   INFLUENZA VACCINE  10/09/2021    Colorectal cancer screening: No longer required.   Mammogram status: Completed bilateral 08/20/2021. Repeat every year  Bone Density status: Completed 11/29/2021. Results reflect: Bone density results: OSTEOPENIA. Repeat every 2 years.  Lung Cancer  Screening: (Low Dose CT Chest recommended if Age 21-80 years, 30 pack-year currently smoking OR have quit w/in 15years.) does not qualify.   Additional Screening:  Hepatitis C Screening: Completed 08/23/2019  Vision Screening: Recommended annual ophthalmology exams for early detection of glaucoma and other disorders of the eye. Is the patient up to date with their annual eye exam?  Yes  Who is the provider or what is the name of the office in which the patient attends annual eye exams? Dr. Allean Found   Dental Screening: Recommended annual dental exams for proper oral hygiene  Community Resource Referral / Chronic Care Management: CRR required this visit?  No   CCM required this visit?  No      Plan:     I have personally reviewed and noted the following in the patient's chart:   Medical and social history Use of alcohol, tobacco or illicit drugs  Current medications and supplements including opioid prescriptions. Patient is not currently taking opioid prescriptions. Functional ability and status Nutritional status Physical activity Advanced directives List of other physicians Hospitalizations, surgeries, and ER visits in previous 12 months Vitals Screenings to include cognitive, depression, and falls Referrals and appointments  In addition, I have reviewed and discussed with patient certain preventive protocols, quality metrics, and best practice recommendations. A written personalized care plan for preventive services as well as general preventive health recommendations were provided to patient.   Due  to this being a telephonic visit, the after visit summary with patients personalized plan was offered to patient via mail or my-chart. Patient would like to access on my-chart.   Marta Antu, LPN   [id number]  Nurse Health Advisor  Nurse Notes: None

## 2022-02-08 ENCOUNTER — Telehealth: Payer: Self-pay | Admitting: *Deleted

## 2022-02-08 NOTE — Telephone Encounter (Signed)
Patient called and scheduled a follow up appt with Dr Berline Lopes on 2/2

## 2022-03-23 NOTE — Progress Notes (Addendum)
[facility] [address], [address] [place], [place] [address] [phone]  Date:  03/25/2022   Name:  Nicole Brennan   DOB:  03-Jun-1940   MRN:  [id number]  PCP:  Nicole Cables, MD    Chief Complaint: 6 month follow up (Concerns/ questions: leg cramps, worse in L/Flu shot today: declines/)   History of Present Illness:  Nicole Brennan is a 82 y.o. very pleasant female patient who presents with the following:  Patient seen today for 30-month follow-up- History of hypothyroidism, osteoporosis, endometrial cancer dx 07/2018, prediabetes Last visit with myself was in July  She had follow-up with her radiation oncologist, Dr. Roselind Brennan in August-they see her once annually No evidence of recurrence, follow-up in 1 year She is seeing her oncologist next month- Dr Pricilla Brennan  Married to [place], they both continue to work.  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- she had one dose so far and plans to do the 2nd dose soon  Recommend COVID booster as well as RSV Flu shot- will give today  Full lab panel done in July  Levothyroxine 75 it is her only prescription medication  She notes cramps in both legs- esp in the right hamstring This bothers her more at night She has noted it for a month or more She tried some OTC medication for it  No change in her routine except the cramps disturb her sleep  Patient Active Problem List   Diagnosis Date Noted   Prediabetes 09/19/2020   Lichen sclerosus 08/03/2019   Endometrial carcinoma (HCC) 08/05/2018   Obesity (BMI 30.0-34.9) 08/05/2018   Seasonal allergies 04/21/2012   Cataract 04/21/2012   Osteoporosis 05/01/2011   Hypothyroidism 04/30/2011   Osteoarthritis 04/30/2011    Past Medical History:  Diagnosis Date   Allergy    Arthritis    Cataract    History of radiation therapy 09/30/2018-10/28/2018   Vaginal cuff HDR; Dr. Antony Blackbird   Thyroid disease      Past Surgical History:  Procedure Laterality Date   DG  BONE DENSITY ([facility] HX)     KNEE SURGERY     ROBOTIC ASSISTED TOTAL HYSTERECTOMY WITH BILATERAL SALPINGO OOPHERECTOMY Bilateral 08/20/2018   Procedure: XI ROBOTIC ASSISTED TOTAL HYSTERECTOMY WITH BILATERAL SALPINGO OOPHORECTOMY;  Surgeon: Nicole Birchwood, MD;  Location: [facility];  Service: Gynecology;  Laterality: Bilateral;   SENTINEL NODE BIOPSY N/A 08/20/2018   Procedure: SENTINEL LYMPH  NODE BIOPSY;  Surgeon: Nicole Birchwood, MD;  Location: [facility];  Service: Gynecology;  Laterality: N/A;   TUBAL LIGATION      Social History   Tobacco Use   Smoking status: Never   Smokeless tobacco: Never  Vaping Use   Vaping Use: Never used  Substance Use Topics   Alcohol use: No   Drug use: No    Family History  Problem Relation Age of Onset   Vascular Disease Mother    Colon cancer Mother    Dementia Father     Allergies  Allergen Reactions   Penicillins Hives    Did it involve swelling of the face/tongue/throat, SOB, or low BP? No Did it involve sudden or severe rash/hives, skin peeling, or any reaction on the inside of your mouth or nose? Unknown Did you need to seek medical attention at a hospital or doctor's office? Yes When did it last happen?Childhood reaction If all above answers are "NO", may proceed  with cephalosporin use.     Medication list has been reviewed and updated.  Current Outpatient Medications on File Prior to Visit  Medication Sig Dispense Refill   acetaminophen (TYLENOL) 500 MG tablet Take 1,000 mg by mouth every 6 (six) hours as needed for headache.     Ascorbic Acid (VITAMIN C PO) Take by mouth.     Calcium-Phosphorus-Vitamin D 250-100-500 MG-MG-UNIT CHEW Chew 2 tablets by mouth at bedtime.      carboxymethylcellul-glycerin (REFRESH OPTIVE) 0.5-0.9 % ophthalmic solution Place 1 drop into both eyes 2 (two) times daily as needed for dry eyes.     Lubricants (ASTROGLIDE) GEL Apply 1 applicator topically as  needed. 66.5 g 3   Multiple Vitamins-Minerals (MULTIVITAMIN WITH MINERALS) tablet Take 1 tablet by mouth every evening. Centrum Silver     No current facility-administered medications on file prior to visit.    Review of Systems:  As per HPI- otherwise negative.  Physical Examination: Vitals:   03/25/22 0850  BP: 130/72  Pulse: 68  Resp: 18  Temp: 97.6 F (36.4 C)  SpO2: 97%   Vitals:   03/25/22 0850  Weight: 188 lb 12.8 oz (85.6 kg)  Height: 5\' 5"  (1.651 m)   Body mass index is 31.42 kg/m. Ideal Body Weight: Weight in (lb) to have BMI = 25: 149.9  GEN: no acute distress.  Mild obesity, looks well  HEENT: Atraumatic, Normocephalic.  Bilateral TM wnl, oropharynx normal.  PEERL,EOMI.   Ears and Nose: No external deformity. CV: RRR, No M/G/R. No JVD. No thrill. No extra heart sounds. PULM: CTA B, no wheezes, crackles, rhonchi. No retractions. No resp. distress. No accessory muscle use. ABD: S, NT, ND No rebound. No HSM. EXTR: No c/c/e PSYCH: Normally interactive. Conversant.  Her thoracolumbar flexion is reduced, likely due to tight hamstrings.  Foot exam is normal, normal pulses and sensation.  No tenderness of her leg muscles. No swelling or cords of her calves  Assessment and Plan: Other specified hypothyroidism - Plan: TSH, levothyroxine (SYNTHROID) 75 MCG tablet  Screening for deficiency anemia - Plan: CBC  Prediabetes - Plan: Basic metabolic panel, Hemoglobin A1c  Leg cramps - Plan: Basic metabolic panel, Ferritin  Decreased exercise tolerance - Plan: CT CARDIAC SCORING (SELF PAY ONLY)  Immunization due - Plan: Flu Vaccine QUAD High Dose(Fluad)  Hamstring tightness  Following up today, gave flu shot Discussed coronary calcium testing, ordered for her today Suspect her leg symptoms are due to tight hamstrings.  Recommended stretching, foam roller, massage gun.  Can also set her up with PT if she would like.  We will also check her electrolytes and a ferritin  level Follow-up on prediabetes today Will plan further follow- up pending labs.   Signed Nicole Amsterdam, MD  Received labs as below, message to patient  Results for orders placed or performed in visit on 03/25/22  Basic metabolic panel  Result Value Ref Range   Sodium 144 135 - 145 mEq/L   Potassium 4.2 3.5 - 5.1 mEq/L   Chloride 108 96 - 112 mEq/L   CO2 27 19 - 32 mEq/L   Glucose, Bld 98 70 - 99 mg/dL   BUN 15 6 - 23 mg/dL   Creatinine, Ser 4.09 0.40 - 1.20 mg/dL   GFR 81.19 >14.78 mL/min   Calcium 9.6 8.4 - 10.5 mg/dL  Hemoglobin G9F  Result Value Ref Range   Hgb A1c MFr Bld 5.9 4.6 - 6.5 %  CBC  Result Value Ref Range  WBC 4.3 4.0 - 10.5 K/uL   RBC 3.84 (L) 3.87 - 5.11 Mil/uL   Platelets 350.0 150.0 - 400.0 K/uL   Hemoglobin 12.6 12.0 - 15.0 g/dL   HCT 78.2 95.6 - 21.3 %   MCV 97.9 78.0 - 100.0 fl   MCHC 33.5 30.0 - 36.0 g/dL   RDW 08.6 57.8 - 46.9 %  TSH  Result Value Ref Range   TSH 1.36 0.35 - 5.50 uIU/mL  Ferritin  Result Value Ref Range   Ferritin 290.1 10.0 - 291.0 ng/mL

## 2022-03-23 NOTE — Patient Instructions (Incomplete)
It was great to see you again today, assuming all is well lets check back in about 6 months  I will be in touch with your labs soon as possible Consider getting the latest COVID booster if not done already, also shingles vaccine series

## 2022-03-25 ENCOUNTER — Other Ambulatory Visit (HOSPITAL_BASED_OUTPATIENT_CLINIC_OR_DEPARTMENT_OTHER): Payer: Self-pay

## 2022-03-25 ENCOUNTER — Ambulatory Visit (INDEPENDENT_AMBULATORY_CARE_PROVIDER_SITE_OTHER): Payer: Medicare Other | Admitting: Family Medicine

## 2022-03-25 ENCOUNTER — Encounter: Payer: Self-pay | Admitting: Family Medicine

## 2022-03-25 VITALS — BP 130/72 | HR 68 | Temp 97.6°F | Resp 18 | Ht 65.0 in | Wt 188.8 lb

## 2022-03-25 DIAGNOSIS — M6289 Other specified disorders of muscle: Secondary | ICD-10-CM

## 2022-03-25 DIAGNOSIS — Z23 Encounter for immunization: Secondary | ICD-10-CM | POA: Diagnosis not present

## 2022-03-25 DIAGNOSIS — E038 Other specified hypothyroidism: Secondary | ICD-10-CM

## 2022-03-25 DIAGNOSIS — R252 Cramp and spasm: Secondary | ICD-10-CM | POA: Diagnosis not present

## 2022-03-25 DIAGNOSIS — Z130 Encounter for screening for diseases of the blood and blood-forming organs and certain disorders involving the immune mechanism: Secondary | ICD-10-CM | POA: Diagnosis not present

## 2022-03-25 DIAGNOSIS — R6889 Other general symptoms and signs: Secondary | ICD-10-CM

## 2022-03-25 DIAGNOSIS — R7303 Prediabetes: Secondary | ICD-10-CM

## 2022-03-25 LAB — FERRITIN: Ferritin: 290.1 ng/mL (ref 10.0–291.0)

## 2022-03-25 LAB — TSH: TSH: 1.36 u[IU]/mL (ref 0.35–5.50)

## 2022-03-25 LAB — BASIC METABOLIC PANEL
BUN: 15 mg/dL (ref 6–23)
CO2: 27 mEq/L (ref 19–32)
Calcium: 9.6 mg/dL (ref 8.4–10.5)
Chloride: 108 mEq/L (ref 96–112)
Creatinine, Ser: 0.88 mg/dL (ref 0.40–1.20)
GFR: 62.21 mL/min (ref >60.00)
Glucose, Bld: 98 mg/dL (ref 70–99)
Potassium: 4.2 mEq/L (ref 3.5–5.1)
Sodium: 144 mEq/L (ref 135–145)

## 2022-03-25 LAB — CBC
HCT: 37.6 % (ref 36.0–46.0)
Hemoglobin: 12.6 g/dL (ref 12.0–15.0)
MCHC: 33.5 g/dL (ref 30.0–36.0)
MCV: 97.9 fl (ref 78.0–100.0)
Platelets: 350 10*3/uL (ref 150.0–400.0)
RBC: 3.84 Mil/uL — ABNORMAL LOW (ref 3.87–5.11)
RDW: 13.3 % (ref 11.5–15.5)
WBC: 4.3 10*3/uL (ref 4.0–10.5)

## 2022-03-25 LAB — HEMOGLOBIN A1C: Hgb A1c MFr Bld: 5.9 % (ref 4.6–6.5)

## 2022-03-25 MED ORDER — LEVOTHYROXINE SODIUM 75 MCG PO TABS
75.0000 ug | ORAL_TABLET | Freq: Every day | ORAL | 3 refills | Status: DC
  Filled 2022-03-25: qty 90, 90d supply, fill #0

## 2022-04-01 ENCOUNTER — Telehealth: Payer: Self-pay | Admitting: *Deleted

## 2022-04-01 NOTE — Telephone Encounter (Signed)
Per patient request rescheduled her appt from 2/2 to 2/23

## 2022-04-02 ENCOUNTER — Encounter: Payer: Self-pay | Admitting: Family Medicine

## 2022-04-12 ENCOUNTER — Ambulatory Visit: Payer: PRIVATE HEALTH INSURANCE | Admitting: Gynecologic Oncology

## 2022-04-15 ENCOUNTER — Ambulatory Visit (HOSPITAL_BASED_OUTPATIENT_CLINIC_OR_DEPARTMENT_OTHER)
Admission: RE | Admit: 2022-04-15 | Discharge: 2022-04-15 | Disposition: A | Discharge status: Home / Self Care | Payer: Medicare Other | Source: Ambulatory Visit | Attending: Family Medicine | Admitting: Family Medicine

## 2022-04-15 ENCOUNTER — Encounter: Payer: Self-pay | Admitting: Family Medicine

## 2022-04-15 DIAGNOSIS — R6889 Other general symptoms and signs: Secondary | ICD-10-CM

## 2022-04-16 ENCOUNTER — Encounter: Payer: Self-pay | Admitting: Family Medicine

## 2022-04-28 ENCOUNTER — Other Ambulatory Visit: Payer: Self-pay | Admitting: Family Medicine

## 2022-04-28 DIAGNOSIS — E038 Other specified hypothyroidism: Secondary | ICD-10-CM

## 2022-05-02 NOTE — Progress Notes (Signed)
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 The patient's history began in May 2020 when she began experiencing vaginal spotting and was seen by Nicole. Murrell Brennan performed a transvaginal ultrasound scan which showed a thickened endometrium which then prompted an endometrial biopsy which was performed on Jul 27, 2018.  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   On August 20, 2018 she underwent a robotic assisted total hysterectomy, BSO sentinel lymph node biopsy.  Intraoperative findings were unremarkable for extrauterine disease.  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  Sentinel lymph nodes were negative for metastatic disease.  The cervix, and adnexa are also negative for metastatic disease.  There was no lymphovascular or perineural invasion seen.   Due to her deep myometrial invasion and at her age she met criteria for intermediate to high risk for recurrence.  She was recommended adjuvant radiation to the vaginal cuff in accordance with NCCN guidelines.   She received vaginal cuff brachytherapy (30 Pearline Cables in 5 fractions) between the dates of July 22 and October 28, 2018.  She tolerated treatment well.  Interval History: Overall doing well.  Has had some symptoms related to sciatica on the right.  Has follow-up for this in March.  Denies any abdominal or pelvic pain.  Reports baseline bowel and bladder function.  Denies any vaginal bleeding or discharge.  Past Medical/Surgical History: Past  Medical History:  Diagnosis Date   Allergy    Arthritis    Cataract    History of radiation therapy 09/30/2018-10/28/2018   Vaginal cuff HDR; Nicole Brennan   Thyroid disease     Past Surgical History:  Procedure Laterality Date   DG  BONE DENSITY ([facility] HX)     KNEE SURGERY     ROBOTIC ASSISTED TOTAL HYSTERECTOMY WITH BILATERAL SALPINGO OOPHERECTOMY Bilateral 08/20/2018   Procedure: XI ROBOTIC ASSISTED TOTAL HYSTERECTOMY WITH BILATERAL SALPINGO OOPHORECTOMY;  Surgeon: Everitt Amber, MD;  Location: [facility];  Service: Gynecology;  Laterality: Bilateral;   SENTINEL NODE BIOPSY N/A 08/20/2018   Procedure: SENTINEL LYMPH  NODE BIOPSY;  Surgeon: Everitt Amber, MD;  Location: [facility];  Service: Gynecology;  Laterality: N/A;   TUBAL LIGATION      Family History  Problem Relation Age of Onset   Vascular Disease Mother    Colon cancer Mother    Dementia Father     Social History   Socioeconomic History   Marital status: Married    Spouse name: Not on file   Number of children: Not on file   Years of education: Not on file   Highest education level: Not on file  Occupational History   Not on file  Tobacco Use   Smoking status: Never   Smokeless tobacco: Never  Vaping Use   Vaping Use: Never used  Substance and Sexual Activity   Alcohol use: No   Drug use: No   Sexual activity: Yes  Other Topics Concern   Not on file  Social History Narrative   Not on file   Social Determinants of Health  Financial Resource Strain: Low Risk  (12/03/2021)   Overall Financial Resource Strain (CARDIA)    Difficulty of Paying Living Expenses: Not hard at all  Food Insecurity: No Food Insecurity (12/03/2021)   Hunger Vital Sign    Worried About Running Out of Food in the Last Year: Never true    Ran Out of Food in the Last Year: Never true  Transportation Needs: No Transportation Needs (12/03/2021)   PRAPARE - Hydrologist (Medical): No    Lack of Transportation  (Non-Medical): No  Physical Activity: Inactive (10/17/2020)   Exercise Vital Sign    Days of Exercise per Week: 0 days    Minutes of Exercise per Session: 0 min  Stress: No Stress Concern Present (10/17/2020)   Warm [place]    Feeling of Stress : Not at all  Social Connections: Moderately Integrated (10/17/2020)   Social Connection and Isolation Panel [NHANES]    Frequency of Communication with Friends and Family: More than three times a week    Frequency of Social Gatherings with Friends and Family: More than three times a week    Attends Religious Services: More than 4 times per year    Active Member of [employer] or Organizations: No    Attends Archivist Meetings: Never    Marital Status: Married    Current Medications:  Current Outpatient Medications:    acetaminophen (TYLENOL) 500 MG tablet, Take 1,000 mg by mouth every 6 (six) hours as needed for headache., Disp: , Rfl:    Ascorbic Acid (VITAMIN C PO), Take by mouth., Disp: , Rfl:    Calcium-Phosphorus-Vitamin D [id number] MG-MG-UNIT CHEW, Chew 2 tablets by mouth at bedtime. , Disp: , Rfl:    levothyroxine (SYNTHROID) 75 MCG tablet, TAKE 1 TABLET BY MOUTH EVERY DAY, Disp: 90 tablet, Rfl: 3   Multiple Vitamins-Minerals (MULTIVITAMIN WITH MINERALS) tablet, Take 1 tablet by mouth every evening. Centrum Silver, Disp: , Rfl:   Review of Systems: + Fatigue, back pain, muscle pain/cramp. Denies appetite changes, fevers, chills, unexplained weight changes. Denies hearing loss, neck lumps or masses, mouth sores, ringing in ears or voice changes. Denies cough or wheezing.  Denies shortness of breath. Denies chest pain or palpitations. Denies leg swelling. Denies abdominal distention, pain, blood in stools, constipation, diarrhea, nausea, vomiting, or early satiety. Denies pain with intercourse, dysuria, frequency, hematuria or incontinence. Denies hot flashes, pelvic  pain, vaginal bleeding or vaginal discharge.   Denies joint pain. Denies itching, rash, or wounds. Denies dizziness, headaches, numbness or seizures. Denies swollen lymph nodes or glands, denies easy bruising or bleeding. Denies anxiety, depression, confusion, or decreased concentration.  Physical Exam: BP (!) 154/55 (BP Location: Right Arm, Patient Position: Sitting)   Pulse 66   Temp 98 F (36.7 C) (Oral)   Resp 16   Ht 5' 5.5" (1.664 m)   Wt 185 lb (83.9 kg)   SpO2 99%   BMI 30.32 kg/m  General: Alert, oriented, no acute distress. HEENT: Normocephalic, atraumatic, sclera anicteric. Chest: Clear to auscultation bilaterally.  No wheezes or rhonchi. Cardiovascular: Regular rate and rhythm, no murmurs. Abdomen: Obese, soft, nontender.  Normoactive bowel sounds.  No masses or hepatosplenomegaly appreciated.  Well-healed incisions. Extremities: Grossly normal range of motion.  Warm, well perfused.  No edema bilaterally. Skin: No rashes or lesions noted. Lymphatics: No cervical, supraclavicular, or inguinal adenopathy. GU: Normal appearing external genitalia without erythema, excoriation, or lesions.  Speculum exam  reveals mildly atrophic vaginal mucosa, radiation changes apparent, no bleeding or discharge, no masses.  Bimanual exam reveals cuff intact, no nodularity or masses.  Both speculum exam and bimanual exam are tolerated with some discomfort.  Rectovaginal exam confirms these findings.  Laboratory & Radiologic Studies: None new  Assessment & Plan: Nicole Brennan is a 82 y.o. woman with stage IB grade 1 endometrial cancer.  MMR intact, MSS. Deep myometrial invasion. S/p adjuvant radiation (vaginal brachytherapy) - completed 10/28/18.   No evidence of disease.  Patient continues to do very well, remains asymptomatic.   Per NCCN surveillance recommendations, I recommend she follow-up at 6 month intervals until 5 years out from completion of adjuvant treatment.  This will continue  to alternate between our office and radiation oncology.  She is scheduled to see Nicole. Sondra Come in August.  I have asked her to call back at the end of the year to schedule a visit with me in January or February 2025.   We reviewed signs and symptoms that would be concerning for disease recurrence and I stressed the importance of calling to see me sooner if she develops any of these  20 minutes of total time was spent for this patient encounter, including preparation, face-to-face counseling with the patient and coordination of care, and documentation of the encounter.  Jeral Pinch, MD  Division of Gynecologic Oncology  Department of Obstetrics and Gynecology  [facility]

## 2022-05-03 ENCOUNTER — Inpatient Hospital Stay: Payer: Medicare Other | Attending: Gynecologic Oncology | Admitting: Gynecologic Oncology

## 2022-05-03 ENCOUNTER — Encounter: Payer: Self-pay | Admitting: Gynecologic Oncology

## 2022-05-03 ENCOUNTER — Other Ambulatory Visit: Payer: Self-pay

## 2022-05-03 VITALS — BP 154/55 | HR 66 | Temp 98.0°F | Resp 16 | Ht 65.5 in | Wt 185.0 lb

## 2022-05-03 DIAGNOSIS — Z90722 Acquired absence of ovaries, bilateral: Secondary | ICD-10-CM | POA: Diagnosis not present

## 2022-05-03 DIAGNOSIS — Z8542 Personal history of malignant neoplasm of other parts of uterus: Secondary | ICD-10-CM | POA: Diagnosis not present

## 2022-05-03 DIAGNOSIS — Z90710 Acquired absence of both cervix and uterus: Secondary | ICD-10-CM | POA: Insufficient documentation

## 2022-05-03 DIAGNOSIS — C541 Malignant neoplasm of endometrium: Secondary | ICD-10-CM

## 2022-05-03 NOTE — Patient Instructions (Signed)
It was good to see you today.  I do not see or feel any evidence of cancer recurrence on your exam.  I will see you for follow-up in 12 months. Please call at the end of 2024 to schedule a visit with me in 2/20225.  As always, if you develop any new and concerning symptoms before your next visit, please call to see me sooner.

## 2022-05-10 DIAGNOSIS — M1611 Unilateral primary osteoarthritis, right hip: Secondary | ICD-10-CM | POA: Diagnosis not present

## 2022-06-07 ENCOUNTER — Other Ambulatory Visit (HOSPITAL_BASED_OUTPATIENT_CLINIC_OR_DEPARTMENT_OTHER): Payer: Self-pay

## 2022-06-10 DIAGNOSIS — M25551 Pain in right hip: Secondary | ICD-10-CM | POA: Diagnosis not present

## 2022-06-17 ENCOUNTER — Other Ambulatory Visit: Payer: Self-pay | Admitting: Family Medicine

## 2022-06-17 ENCOUNTER — Other Ambulatory Visit (HOSPITAL_BASED_OUTPATIENT_CLINIC_OR_DEPARTMENT_OTHER): Payer: Self-pay

## 2022-06-17 DIAGNOSIS — E038 Other specified hypothyroidism: Secondary | ICD-10-CM

## 2022-06-17 MED ORDER — LEVOTHYROXINE SODIUM 75 MCG PO TABS
75.0000 ug | ORAL_TABLET | Freq: Every day | ORAL | 3 refills | Status: DC
  Filled 2022-06-17: qty 90, 90d supply, fill #0
  Filled 2022-09-17: qty 90, 90d supply, fill #1
  Filled 2022-11-28: qty 90, 90d supply, fill #2
  Filled 2023-02-27: qty 90, 90d supply, fill #3

## 2022-07-08 ENCOUNTER — Other Ambulatory Visit (HOSPITAL_BASED_OUTPATIENT_CLINIC_OR_DEPARTMENT_OTHER): Payer: Self-pay | Admitting: Family Medicine

## 2022-07-08 DIAGNOSIS — Z1231 Encounter for screening mammogram for malignant neoplasm of breast: Secondary | ICD-10-CM

## 2022-07-10 DIAGNOSIS — M25551 Pain in right hip: Secondary | ICD-10-CM | POA: Diagnosis not present

## 2022-07-16 DIAGNOSIS — M533 Sacrococcygeal disorders, not elsewhere classified: Secondary | ICD-10-CM | POA: Diagnosis not present

## 2022-08-26 ENCOUNTER — Encounter (HOSPITAL_BASED_OUTPATIENT_CLINIC_OR_DEPARTMENT_OTHER): Payer: Self-pay

## 2022-08-26 ENCOUNTER — Ambulatory Visit (HOSPITAL_BASED_OUTPATIENT_CLINIC_OR_DEPARTMENT_OTHER)
Admission: RE | Admit: 2022-08-26 | Discharge: 2022-08-26 | Disposition: A | Discharge status: Home / Self Care | Payer: Medicare Other | Source: Ambulatory Visit | Attending: Family Medicine | Admitting: Family Medicine

## 2022-08-26 DIAGNOSIS — Z1231 Encounter for screening mammogram for malignant neoplasm of breast: Secondary | ICD-10-CM | POA: Diagnosis not present

## 2022-08-28 ENCOUNTER — Ambulatory Visit (INDEPENDENT_AMBULATORY_CARE_PROVIDER_SITE_OTHER): Payer: Medicare Other | Admitting: Family Medicine

## 2022-08-28 ENCOUNTER — Other Ambulatory Visit (HOSPITAL_BASED_OUTPATIENT_CLINIC_OR_DEPARTMENT_OTHER): Payer: Self-pay

## 2022-08-28 ENCOUNTER — Ambulatory Visit (HOSPITAL_BASED_OUTPATIENT_CLINIC_OR_DEPARTMENT_OTHER)
Admission: RE | Admit: 2022-08-28 | Discharge: 2022-08-28 | Disposition: A | Discharge status: Home / Self Care | Payer: Medicare Other | Source: Ambulatory Visit | Attending: Family Medicine | Admitting: Family Medicine

## 2022-08-28 ENCOUNTER — Encounter: Payer: Self-pay | Admitting: Family Medicine

## 2022-08-28 ENCOUNTER — Telehealth: Payer: Self-pay

## 2022-08-28 VITALS — BP 134/70 | HR 80 | Temp 97.9°F | Ht 65.5 in | Wt 183.0 lb

## 2022-08-28 DIAGNOSIS — R1011 Right upper quadrant pain: Secondary | ICD-10-CM | POA: Diagnosis not present

## 2022-08-28 DIAGNOSIS — R112 Nausea with vomiting, unspecified: Secondary | ICD-10-CM | POA: Diagnosis not present

## 2022-08-28 DIAGNOSIS — J302 Other seasonal allergic rhinitis: Secondary | ICD-10-CM | POA: Diagnosis not present

## 2022-08-28 DIAGNOSIS — K760 Fatty (change of) liver, not elsewhere classified: Secondary | ICD-10-CM | POA: Diagnosis not present

## 2022-08-28 MED ORDER — FLUTICASONE PROPIONATE 50 MCG/ACT NA SUSP
2.0000 | Freq: Every day | NASAL | 6 refills | Status: DC
  Filled 2022-08-28: qty 16, 30d supply, fill #0

## 2022-08-28 MED ORDER — FLUTICASONE PROPIONATE 50 MCG/ACT NA SUSP
2.0000 | Freq: Every day | NASAL | 6 refills | Status: DC
Start: 2022-08-28 — End: 2022-08-28

## 2022-08-28 NOTE — Telephone Encounter (Signed)
Initial Comment Caller is Quintara, they see Dr.Copeland, and couldn't get an appt. till tomorrow with Vivien Rota. 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 This afternoon, they barely made it from the piano bench to a restroom and threw up twice, and all they had was pineapple juice so that's all they threw up. They have had really had a lot of pain, 2-3 inches above their navel and up between their breast. They didn't know if that's a sign of something not good. Their blood pressure was 135/84 that their husband just took. They aren't nauseated at present. Translation No Nurse Assessment Nurse: Lorin Picket, RN, Elnita Maxwell Date/Time (Eastern Time): 08/27/2022 5:01:19 PM Confirm and document reason for call. If symptomatic, describe symptoms. ---Caller states a week ago she had violent vomiting that lasted a few hour, has abdomen pain located above navel and it started an hour ago, pain is was constant and is now coming and going, no pain now but did have pain in last hour, vomiting started again today around 320 pm. and has vomited 3 times today, is able to stand and walk without support, last fluid intake was at 1230 today, is voiding, denies diarrhea, fever and other symptoms, Current BP-135/84 Does the patient have any new or worsening symptoms? ---Yes Will a triage be completed? ---Yes Related visit to physician within the last 2 weeks? ---No Does the PT have any chronic conditions? (i.e. diabetes, asthma, this includes High risk factors for pregnancy, etc.) ---Yes PLEASE NOTE: All timestamps contained within this report are represented as [place] Standard Time. CONFIDENTIALTY NOTICE: This fax transmission is intended only for the addressee. It contains information that is legally privileged, confidential or otherwise protected from use or disclosure. If  you are not the intended recipient, you are strictly prohibited from reviewing, disclosing, copying using or disseminating any of this information or taking any action in reliance on or regarding this information. If you have received this fax in error, please notify us immediately by telephone so that we can arrange for its return to [place]. Phone: [phone], Toll-Free: [phone], Fax: [phone] Page: 2 of 2 Call Id: [id number] Nurse Assessment List chronic conditions. ---Hypothyroid Is this a behavioral health or substance abuse call? ---No Guidelines Guideline Title Affirmed Question Affirmed Notes Nurse Date/Time Lamount Cohen Time) Vomiting [1] Drinking very little AND [2] dehydration suspected (e.g., no urine > 12 hours, very dry mouth, very lightheaded) Lorin Picket, RN, Elnita Maxwell 08/27/2022 5:07:29 PM Disp. Time Lamount Cohen Time) Disposition Final User 08/27/2022 5:00:07 PM Send to Urgent Queue Wandra Mannan 08/27/2022 5:13:03 PM Go to ED Now (or PCP triage) Yes Lorin Picket, RN, Elnita Maxwell Final Disposition 08/27/2022 5:13:03 PM Go to ED Now (or PCP triage) Yes Lorin Picket, RN, Elizabeth Sauer Disagree/Comply Disagree Caller Understands Yes PreDisposition Call Doctor Care Advice Given Per Guideline GO TO ED NOW (OR PCP TRIAGE): * IF NO PCP (PRIMARY CARE PROVIDER) SECOND-LEVEL TRIAGE: You need to be seen within the next hour. Go to the ED/UCC at _____________ Hospital. Leave as soon as you can. Comments User: Dorris Fetch, RN Date/Time Lamount Cohen Time): 08/27/2022 5:13:46 PM Refused ER has office appt tomorrow Referrals GO TO FACILITY REFUSED

## 2022-08-28 NOTE — Progress Notes (Signed)
Acute Office Visit  Subjective:     Patient ID: Nicole Brennan, female    DOB: Mar 29, 1940, 82 y.o.   MRN: [id number]  Chief Complaint  Patient presents with   Vomiting    HPI Patient is in today for nausea/vomiting.   Discussed the use of AI scribe software for clinical note transcription with the patient, who gave verbal consent to proceed.  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 The first episode occurred after consuming a mocha frappe, and the second after drinking pineapple juice. Both episodes were followed by a lack of appetite and a feeling of fatigue. The patient also reports a persistent, intermittent ache in the upper abdomen, more pronounced on the right side, which sometimes radiates to the back. The patient denies any associated diarrhea or constipation, but reports infrequent bowel movements, with the last one occurring a couple of days ago. The patient denies any recent changes in diet or medication, and has not experienced similar symptoms in the past.        ROS All review of systems negative except what is listed in the HPI      Objective:    BP 134/70   Pulse 80   Temp 97.9 F (36.6 C) (Oral)   Ht 5' 5.5" (1.664 m)   Wt 183 lb (83 kg)   SpO2 100%   BMI 29.99 kg/m    Physical Exam Vitals reviewed.  Constitutional:      Appearance: Normal appearance.  Cardiovascular:     Rate and Rhythm: Normal rate and regular rhythm.     Pulses: Normal pulses.     Heart sounds: Normal heart sounds.  Pulmonary:     Effort: Pulmonary effort is normal.     Breath sounds: Normal breath sounds.  Abdominal:     Tenderness: There is abdominal tenderness in the right upper quadrant. There is guarding. There is no rebound. Negative signs include McBurney's sign.  Skin:    General: Skin is warm and dry.  Neurological:     Mental Status: She is alert  and oriented to person, place, and time.  Psychiatric:        Mood and Affect: Mood normal.        Behavior: Behavior normal.        Thought Content: Thought content normal.        Judgment: Judgment normal.     No results found for any visits on 08/28/22.      Assessment & Plan:   Problem List Items Addressed This Visit     Seasonal allergies Adding Flonase   Relevant Medications   fluticasone (FLONASE) 50 MCG/ACT nasal spray   Other Visit Diagnoses     RUQ pain    -  Primary   Relevant Orders   CBC with Differential/Platelet   Comprehensive metabolic panel   US Abdomen Limited RUQ (LIVER/GB)   Nausea and vomiting, unspecified vomiting type       Relevant Orders   CBC with Differential/Platelet   Comprehensive metabolic panel   US Abdomen Limited RUQ (LIVER/GB)     Significant tenderness and guarding to RUQ palpation. Labs and [place] ordered. Advised gentle diet. Patient aware of signs/symptoms requiring further/urgent evaluation.   Meds ordered this encounter  Medications   DISCONTD: fluticasone (FLONASE) 50 MCG/ACT nasal spray    Sig: Place 2 sprays into both nostrils  daily.    Dispense:  16 g    Refill:  6    Order Specific Question:   Supervising Provider    Answer:   Danise Edge A [[id number]]   fluticasone (FLONASE) 50 MCG/ACT nasal spray    Sig: Place 2 sprays into both nostrils daily.    Dispense:  16 g    Refill:  6    Return if symptoms worsen or fail to improve.  Clayborne Dana, NP

## 2022-08-28 NOTE — Telephone Encounter (Signed)
Appt today with Taylor.  ?

## 2022-08-29 LAB — COMPREHENSIVE METABOLIC PANEL
ALT: 20 U/L (ref 0–35)
AST: 23 U/L (ref 0–37)
Albumin: 4.2 g/dL (ref 3.5–5.2)
Alkaline Phosphatase: 55 U/L (ref 39–117)
BUN: 17 mg/dL (ref 6–23)
CO2: 28 mEq/L (ref 19–32)
Calcium: 9.3 mg/dL (ref 8.4–10.5)
Chloride: 105 mEq/L (ref 96–112)
Creatinine, Ser: 0.98 mg/dL (ref 0.40–1.20)
GFR: 54.51 mL/min — ABNORMAL LOW (ref >60.00)
Glucose, Bld: 78 mg/dL (ref 70–99)
Potassium: 4.1 mEq/L (ref 3.5–5.1)
Sodium: 142 mEq/L (ref 135–145)
Total Bilirubin: 0.6 mg/dL (ref 0.2–1.2)
Total Protein: 6.6 g/dL (ref 6.0–8.3)

## 2022-08-29 LAB — CBC WITH DIFFERENTIAL/PLATELET
Basophils Absolute: 0.1 10*3/uL (ref 0.0–0.1)
Basophils Relative: 0.9 % (ref 0.0–3.0)
Eosinophils Absolute: 0.2 10*3/uL (ref 0.0–0.7)
Eosinophils Relative: 3 % (ref 0.0–5.0)
HCT: 39 % (ref 36.0–46.0)
Hemoglobin: 12.6 g/dL (ref 12.0–15.0)
Lymphocytes Relative: 30.6 % (ref 12.0–46.0)
Lymphs Abs: 1.8 10*3/uL (ref 0.7–4.0)
MCHC: 32.4 g/dL (ref 30.0–36.0)
MCV: 100.1 fl — ABNORMAL HIGH (ref 78.0–100.0)
Monocytes Absolute: 0.5 10*3/uL (ref 0.1–1.0)
Monocytes Relative: 9 % (ref 3.0–12.0)
Neutro Abs: 3.3 10*3/uL (ref 1.4–7.7)
Neutrophils Relative %: 56.5 % (ref 43.0–77.0)
Platelets: 358 10*3/uL (ref 150.0–400.0)
RBC: 3.89 Mil/uL (ref 3.87–5.11)
RDW: 13.4 % (ref 11.5–15.5)
WBC: 5.9 10*3/uL (ref 4.0–10.5)

## 2022-09-17 ENCOUNTER — Other Ambulatory Visit: Payer: Self-pay

## 2022-09-17 ENCOUNTER — Other Ambulatory Visit (HOSPITAL_BASED_OUTPATIENT_CLINIC_OR_DEPARTMENT_OTHER): Payer: Self-pay

## 2022-09-18 DIAGNOSIS — K080 Exfoliation of teeth due to systemic causes: Secondary | ICD-10-CM | POA: Diagnosis not present

## 2022-09-21 NOTE — Progress Notes (Signed)
[facility] [address] [place], [place] [address] [phone]  Date:  09/30/2022   Name:  Nicole Brennan   DOB:  October 22, 1940   MRN:  [id number]  PCP:  Pearline Cables, MD    Chief Complaint: 6 month follow up (Concerns/ questions: 1. 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 2. Review [place] results /Shingrix #2 due received at [facility] due)   History of Present Illness:  Nicole Brennan is a 82 y.o. very pleasant female patient who presents with the following:  Patient seen today for periodic follow-up Most recent visit with myself was in January of this year History of hypothyroidism, osteoporosis, endometrial cancer dx 07/2018, prediabetes  She is seen annually by radiation oncology at this time She also follows up with her gynecologic oncologist, Dr. Charolotte Capuchin recent visit was in February: Nicole Brennan is a 82 y.o. woman with stage IB grade 1 endometrial cancer.  MMR intact, MSS. Deep myometrial invasion. S/p adjuvant radiation (vaginal brachytherapy) - completed 10/28/18. No evidence of disease.  Patient continues to do very well, remains asymptomatic. Per NCCN surveillance recommendations, I recommend she follow-up at 6 month intervals until 5 years out from completion of adjuvant treatment.  This will continue to alternate between our office and radiation oncology.  She is scheduled to see Dr. Roselind Messier in August.  I have asked her to call back at the end of the year to schedule a visit with me in January or February 2025.  Married to [place], they both continue to work.  She is a Social worker   Second dose Shingrix- this is done  COVID-19 booster recommended  She did a coronary calcium score earlier this year, score of 0  She saw my partner Hyman Hopes, nurse practitioner last month with concern of abdominal pain.  Lab work, right upper quadrant ultrasound were ordered Ultrasound was normal Pt  notes she is not having these sx any longer.  We dicussed possible lactose intolerance as her sx started after drinking a milkshake She had a CMP, CBC in June-otherwise and most recently did labs in January  DEXA was updated 1 year ago-osteopenia Mammogram is up-to-date Cologuard completed 2021-can update if she would like.  She would like to have screening, reasonable given her very good health  Lab Results  Component Value Date   TSH 1.36 03/25/2022   Lab Results  Component Value Date   HGBA1C 5.9 03/25/2022    Patient Active Problem List   Diagnosis Date Noted   Prediabetes 09/19/2020   Lichen sclerosus 08/03/2019   Endometrial carcinoma (HCC) 08/05/2018   Obesity (BMI 30.0-34.9) 08/05/2018   Seasonal allergies 04/21/2012   Cataract 04/21/2012   Osteoporosis 05/01/2011   Hypothyroidism 04/30/2011   Osteoarthritis 04/30/2011    Past Medical History:  Diagnosis Date   Allergy    Arthritis    Cataract    History of radiation therapy 09/30/2018-10/28/2018   Vaginal cuff HDR; Dr. Antony Blackbird   Thyroid disease     Past Surgical History:  Procedure Laterality Date   DG  BONE DENSITY ([facility] HX)     KNEE SURGERY     ROBOTIC ASSISTED TOTAL HYSTERECTOMY WITH BILATERAL SALPINGO OOPHERECTOMY Bilateral 08/20/2018   Procedure: XI ROBOTIC ASSISTED TOTAL HYSTERECTOMY WITH BILATERAL SALPINGO OOPHORECTOMY;  Surgeon: Adolphus Birchwood, MD;  Location: [facility];  Service: Gynecology;  Laterality: Bilateral;   SENTINEL NODE BIOPSY N/A  08/20/2018   Procedure: SENTINEL LYMPH  NODE BIOPSY;  Surgeon: Adolphus Birchwood, MD;  Location: [facility];  Service: Gynecology;  Laterality: N/A;   TUBAL LIGATION      Social History   Tobacco Use   Smoking status: Never   Smokeless tobacco: Never  Vaping Use   Vaping status: Never Used  Substance Use Topics   Alcohol use: No   Drug use: No    Family History  Problem Relation Age of Onset   Vascular Disease Mother    Colon cancer Mother    Dementia Father      Allergies  Allergen Reactions   Penicillins Hives    Did it involve swelling of the face/tongue/throat, SOB, or low BP? No Did it involve sudden or severe rash/hives, skin peeling, or any reaction on the inside of your mouth or nose? Unknown Did you need to seek medical attention at a hospital or doctor's office? Yes When did it last happen?Childhood reaction If all above answers are "NO", may proceed with cephalosporin use.     Medication list has been reviewed and updated.  Current Outpatient Medications on File Prior to Visit  Medication Sig Dispense Refill   acetaminophen (TYLENOL) 500 MG tablet Take 1,000 mg by mouth every 6 (six) hours as needed for headache.     Ascorbic Acid (VITAMIN C PO) Take by mouth.     Calcium-Phosphorus-Vitamin D 250-100-500 MG-MG-UNIT CHEW Chew 2 tablets by mouth at bedtime.      fluticasone (FLONASE) 50 MCG/ACT nasal spray Place 2 sprays into both nostrils daily. 16 g 6   levothyroxine (SYNTHROID) 75 MCG tablet Take 1 tablet (75 mcg total) by mouth daily. 90 tablet 3   Multiple Vitamins-Minerals (MULTIVITAMIN WITH MINERALS) tablet Take 1 tablet by mouth every evening. Centrum Silver     No current facility-administered medications on file prior to visit.    Review of Systems:  As per HPI- otherwise negative.   Physical Examination: Vitals:   09/30/22 0833  BP: 120/72  Pulse: 71  Resp: 18  Temp: 97.6 F (36.4 C)  SpO2: 95%   Vitals:   09/30/22 0833  Weight: 185 lb 12.8 oz (84.3 kg)  Height: 5' 5.5" (1.664 m)   Body mass index is 30.45 kg/m. Ideal Body Weight: Weight in (lb) to have BMI = 25: 152.2  GEN: no acute distress. Mild obesity, looks well  HEENT: Atraumatic, Normocephalic.  Bilateral TM wnl, oropharynx normal.  PEERL,EOMI.   Ears and Nose: No external deformity. CV: RRR, No M/G/R. No JVD. No thrill. No extra heart sounds. PULM: CTA B, no wheezes, crackles, rhonchi. No retractions. No resp. distress. No accessory  muscle use. ABD: S, NT, ND. No rebound. No HSM. EXTR: No c/c/e PSYCH: Normally interactive. Conversant.    Assessment and Plan: Prediabetes - Plan: Hemoglobin A1c  Other specified hypothyroidism - Plan: TSH  Endometrial carcinoma (HCC)  Screening for colon cancer - Plan: Cologuard  Cologuard ordered Check TSH, A1c Shingrix completed Will plan further follow- up pending labs. Recheck in 6 months  Signed Abbe Amsterdam, MD  Received labs as below, message to pt  Results for orders placed or performed in visit on 09/30/22  Hemoglobin A1c  Result Value Ref Range   Hgb A1c MFr Bld 5.9 4.6 - 6.5 %  TSH  Result Value Ref Range   TSH 2.81 0.35 - 5.50 uIU/mL

## 2022-09-21 NOTE — Patient Instructions (Addendum)
It was great to see you again today, I will be in touch with your labs soon as possible.  Assuming all is well, please see me in about 6 months  Recommend second dose of Shingrix if not done already done! Also recommend COVID booster if not completed in the last year or so

## 2022-09-30 ENCOUNTER — Ambulatory Visit (INDEPENDENT_AMBULATORY_CARE_PROVIDER_SITE_OTHER): Payer: Medicare Other | Admitting: Family Medicine

## 2022-09-30 ENCOUNTER — Encounter: Payer: Self-pay | Admitting: Family Medicine

## 2022-09-30 VITALS — BP 120/72 | HR 71 | Temp 97.6°F | Resp 18 | Ht 65.5 in | Wt 185.8 lb

## 2022-09-30 DIAGNOSIS — C541 Malignant neoplasm of endometrium: Secondary | ICD-10-CM | POA: Diagnosis not present

## 2022-09-30 DIAGNOSIS — E038 Other specified hypothyroidism: Secondary | ICD-10-CM

## 2022-09-30 DIAGNOSIS — R7303 Prediabetes: Secondary | ICD-10-CM | POA: Diagnosis not present

## 2022-09-30 DIAGNOSIS — Z1211 Encounter for screening for malignant neoplasm of colon: Secondary | ICD-10-CM | POA: Diagnosis not present

## 2022-09-30 LAB — HEMOGLOBIN A1C: Hgb A1c MFr Bld: 5.9 % (ref 4.6–6.5)

## 2022-09-30 LAB — TSH: TSH: 2.81 u[IU]/mL (ref 0.35–5.50)

## 2022-10-01 ENCOUNTER — Other Ambulatory Visit (HOSPITAL_BASED_OUTPATIENT_CLINIC_OR_DEPARTMENT_OTHER): Payer: Self-pay

## 2022-10-01 DIAGNOSIS — K080 Exfoliation of teeth due to systemic causes: Secondary | ICD-10-CM | POA: Diagnosis not present

## 2022-10-01 DIAGNOSIS — H524 Presbyopia: Secondary | ICD-10-CM | POA: Diagnosis not present

## 2022-10-03 DIAGNOSIS — Z1211 Encounter for screening for malignant neoplasm of colon: Secondary | ICD-10-CM | POA: Diagnosis not present

## 2022-10-09 ENCOUNTER — Encounter: Payer: Self-pay | Admitting: Family Medicine

## 2022-10-14 ENCOUNTER — Ambulatory Visit (INDEPENDENT_AMBULATORY_CARE_PROVIDER_SITE_OTHER): Payer: Medicare Other | Admitting: Family Medicine

## 2022-10-14 VITALS — BP 122/60 | HR 81 | Temp 97.8°F | Resp 18 | Ht 65.5 in | Wt 168.8 lb

## 2022-10-14 DIAGNOSIS — J040 Acute laryngitis: Secondary | ICD-10-CM | POA: Diagnosis not present

## 2022-10-14 LAB — POC COVID19 BINAXNOW: SARS Coronavirus 2 Ag: NEGATIVE

## 2022-10-14 NOTE — Progress Notes (Signed)
[facility] [address], [address] [place], [place] [address] [phone]  Date:  10/14/2022   Name:  Nicole Brennan   DOB:  [birth year]   MRN:  [id number]  PCP:  Pearline Cables, MD    Chief Complaint: URI (Pt c/o Headache, Lost Voice x this morning. )   History of Present Illness:  Nicole Brennan is a 82 y.o. very pleasant female patient who presents with the following:  Pt seen today for a sick visit  She notes laryngitis  Yesterday am she was not feeling 100% and her voice was hoarse She was a bit tired and took a nap This am her voice was quite hoarse  No fever Very mild cough Her throat is not sore    Patient Active Problem List   Diagnosis Date Noted   Prediabetes 09/19/2020   Lichen sclerosus 08/03/2019   Endometrial carcinoma (HCC) 08/05/2018   Obesity (BMI 30.0-34.9) 08/05/2018   Seasonal allergies 04/21/2012   Cataract 04/21/2012   Osteoporosis 05/01/2011   Hypothyroidism 04/30/2011   Osteoarthritis 04/30/2011    Past Medical History:  Diagnosis Date   Allergy    Arthritis    Cataract    History of radiation therapy 09/30/2018-10/28/2018   Vaginal cuff HDR; Dr. Antony Blackbird   Thyroid disease     Past Surgical History:  Procedure Laterality Date   DG  BONE DENSITY ([facility] HX)     KNEE SURGERY     ROBOTIC ASSISTED TOTAL HYSTERECTOMY WITH BILATERAL SALPINGO OOPHERECTOMY Bilateral 08/20/2018   Procedure: XI ROBOTIC ASSISTED TOTAL HYSTERECTOMY WITH BILATERAL SALPINGO OOPHORECTOMY;  Surgeon: Adolphus Birchwood, MD;  Location: [facility];  Service: Gynecology;  Laterality: Bilateral;   SENTINEL NODE BIOPSY N/A 08/20/2018   Procedure: SENTINEL LYMPH  NODE BIOPSY;  Surgeon: Adolphus Birchwood, MD;  Location: [facility];  Service: Gynecology;  Laterality: N/A;   TUBAL LIGATION      Social History   Tobacco Use   Smoking status: Never   Smokeless tobacco: Never  Vaping Use   Vaping status: Never Used  Substance Use Topics    Alcohol use: No   Drug use: No    Family History  Problem Relation Age of Onset   Vascular Disease Mother    Colon cancer Mother    Dementia Father     Allergies  Allergen Reactions   Penicillins Hives    Did it involve swelling of the face/tongue/throat, SOB, or low BP? No Did it involve sudden or severe rash/hives, skin peeling, or any reaction on the inside of your mouth or nose? Unknown Did you need to seek medical attention at a hospital or doctor's office? Yes When did it last happen?Childhood reaction If all above answers are "NO", may proceed with cephalosporin use.     Medication list has been reviewed and updated.  Current Outpatient Medications on File Prior to Visit  Medication Sig Dispense Refill   acetaminophen (TYLENOL) 500 MG tablet Take 1,000 mg by mouth every 6 (six) hours as needed for headache.     Ascorbic Acid (VITAMIN C PO) Take by mouth.     Calcium-Phosphorus-Vitamin D 250-100-500 MG-MG-UNIT CHEW Chew 2 tablets by mouth at bedtime.      fluticasone (FLONASE) 50 MCG/ACT nasal spray Place 2 sprays into both nostrils daily. 16 g 6   levothyroxine (SYNTHROID) 75 MCG tablet Take 1 tablet (75 mcg total) by mouth daily. 90 tablet 3  Multiple Vitamins-Minerals (MULTIVITAMIN WITH MINERALS) tablet Take 1 tablet by mouth every evening. Centrum Silver     No current facility-administered medications on file prior to visit.    Review of Systems:  As per HPI- otherwise negative.   Physical Examination: Vitals:   10/14/22 0956  BP: 122/60  Pulse: 81  Resp: 18  Temp: 97.8 F (36.6 C)  SpO2: 97%   Vitals:   10/14/22 0956  Weight: 168 lb 12.8 oz (76.6 kg)  Height: 5' 5.5" (1.664 m)   Body mass index is 27.66 kg/m. Ideal Body Weight: Weight in (lb) to have BMI = 25: 152.2  GEN: no acute distress. Mild overweight, looks well Lost voice  HEENT: Atraumatic, Normocephalic.  Bilateral TM wnl, oropharynx normal.  PEERL,EOMI.   Ears and Nose: No  external deformity. CV: RRR, No M/G/R. No JVD. No thrill. No extra heart sounds. PULM: CTA B, no wheezes, crackles, rhonchi. No retractions. No resp. distress. No accessory muscle use. EXTR: No c/c/e PSYCH: Normally interactive. Conversant.   Results for orders placed or performed in visit on 10/14/22  POC COVID-19 BinaxNow  Result Value Ref Range   SARS Coronavirus 2 Ag Negative Negative     Assessment and Plan: Laryngitis - Plan: POC COVID-19 BinaxNow  Pt seen today with mild illness sx-  You have laryngitis - the best way to get your voice back is to REST your voice as much as possible You are covid negative today- I would suggest recheck a covid in 48 hours to make sure you are still negative  If you are getting worse, have a change in symptoms or are not improving over the next few days please alert me!    Signed Abbe Amsterdam, MD

## 2022-10-14 NOTE — Patient Instructions (Addendum)
You have laryngitis - the best way to get your voice back is to REST your voice as much as possible You are covid negative today- I would suggest recheck a covid in 48 hours to make sure you are still negative  If you are getting worse, have a change in symptoms or are not improving over the next few days please alert me!

## 2022-10-15 ENCOUNTER — Other Ambulatory Visit (HOSPITAL_BASED_OUTPATIENT_CLINIC_OR_DEPARTMENT_OTHER): Payer: Self-pay

## 2022-11-03 NOTE — Progress Notes (Signed)
Radiation Oncology         [phone] ________________________________  Name: LOCHLYN KLINDT MRN: [id number]  Date: 11/04/2022  DOB: 31-Aug-1940  Follow-Up Visit Note  CC: Copland, Gwenlyn Found, MD  Copland, Gwenlyn Found, MD  No diagnosis found.  Diagnosis:  FIGO Stage I-B, grade 1 endometrioid adenocarcinoma, pT1b, pN0   Interval Since Last Radiation:  4 years and 7 days   Radiation Treatment Dates: 09/30/2018, 10/07/2018, 10/14/2018, 10/21/2018, 10/28/2018:   Site/Dose: Vaginal Cuff (Brachytherapy) / 30 Gy in 5 fractions  Narrative:  The patient returns today for routine annual follow-up. She was last seen here for follow-up on 11/05/21. Since her last visit, the patient followed up with Dr. Pricilla Holm on 05/03/22. During which time, the patient denied any symptoms concerning for disease recurrence and she was noted as NED on examination.          No other significant oncologic interval history since the patient was last seen for follow-up.              Imaging performed in the interval since her last visit includes:  -- Cardiac scoring chest CT on 04/15/22 demonstrated a coronary calcium score of 0. CT also showed no significant extracardiac findings.  -- Bilateral screening mammogram on 08/26/22 demonstrated no evidence of malignancy in either breast.  -- Right upper quadrant abdominal US on 08/28/22 (for evaluation of abdominal pain, nausea, and vomiting) showed no acute abnormalities to account for the patient's symptoms.            ***  Allergies:  is allergic to penicillins.  Meds: Current Outpatient Medications  Medication Sig Dispense Refill   acetaminophen (TYLENOL) 500 MG tablet Take 1,000 mg by mouth every 6 (six) hours as needed for headache.     Ascorbic Acid (VITAMIN C PO) Take by mouth.     Calcium-Phosphorus-Vitamin D 250-100-500 MG-MG-UNIT CHEW Chew 2 tablets by mouth at bedtime.      fluticasone (FLONASE) 50 MCG/ACT nasal spray Place 2 sprays into both nostrils  daily. 16 g 6   levothyroxine (SYNTHROID) 75 MCG tablet Take 1 tablet (75 mcg total) by mouth daily. 90 tablet 3   Multiple Vitamins-Minerals (MULTIVITAMIN WITH MINERALS) tablet Take 1 tablet by mouth every evening. Centrum Silver     No current facility-administered medications for this encounter.    Physical Findings: The patient is in no acute distress. Patient is alert and oriented.  vitals were not taken for this visit. .  No significant changes. Lungs are clear to auscultation bilaterally. Heart has regular rate and rhythm. No palpable cervical, supraclavicular, or axillary adenopathy. Abdomen soft, non-tender, normal bowel sounds.  On pelvic examination the external genitalia were unremarkable. A speculum exam was performed. There are no mucosal lesions noted in the vaginal vault. A Pap smear was obtained of the proximal vagina. On bimanual and rectovaginal examination there were no pelvic masses appreciated. ***   Lab Findings: Lab Results  Component Value Date   WBC 5.9 08/28/2022   HGB 12.6 08/28/2022   HCT 39.0 08/28/2022   MCV 100.1 (H) 08/28/2022   PLT 358.0 08/28/2022    Radiographic Findings: No results found.  Impression:   FIGO Stage I-B, grade 1 endometrioid adenocarcinoma, pT1b, pN0   The patient is recovering from the effects of radiation.  ***  Plan:  ***   *** minutes of total time was spent for this patient encounter, including preparation, face-to-face counseling with the patient and coordination of care, physical exam,  and documentation of the encounter. ____________________________________  Billie Lade, PhD, MD  This document serves as a record of services personally performed by Antony Blackbird, MD. It was created on his behalf by Neena Rhymes, a trained medical scribe. The creation of this record is based on the scribe's personal observations and the provider's statements to them. This document has been checked and approved by the attending  provider.

## 2022-11-04 ENCOUNTER — Encounter: Payer: Self-pay | Admitting: Radiation Oncology

## 2022-11-04 ENCOUNTER — Ambulatory Visit
Admission: RE | Admit: 2022-11-04 | Discharge: 2022-11-04 | Disposition: A | Discharge status: Home / Self Care | Payer: Medicare Other | Source: Ambulatory Visit | Attending: Radiation Oncology | Admitting: Radiation Oncology

## 2022-11-04 VITALS — BP 142/63 | HR 70 | Temp 97.7°F | Resp 18 | Ht 65.5 in | Wt 186.5 lb

## 2022-11-04 DIAGNOSIS — Z923 Personal history of irradiation: Secondary | ICD-10-CM | POA: Diagnosis not present

## 2022-11-04 DIAGNOSIS — Z8542 Personal history of malignant neoplasm of other parts of uterus: Secondary | ICD-10-CM | POA: Diagnosis not present

## 2022-11-04 DIAGNOSIS — Z79890 Hormone replacement therapy (postmenopausal): Secondary | ICD-10-CM | POA: Diagnosis not present

## 2022-11-04 DIAGNOSIS — C541 Malignant neoplasm of endometrium: Secondary | ICD-10-CM | POA: Diagnosis not present

## 2022-11-04 NOTE — Progress Notes (Signed)
Elenora L Marulanda is here today for follow up post radiation to the pelvic.  They completed their radiation on: 10/28/18   Does the patient complain of any of the following:  Pain: No Abdominal bloating: No Diarrhea/Constipation: No Nausea/Vomiting: No Vaginal Discharge: No Blood in Urine or Stool: No Urinary Issues (dysuria/incomplete emptying/ incontinence/ increased frequency/urgency): No Does patient report using vaginal dilator 2-3 times a week and/or sexually active 2-3 weeks: No Post radiation skin changes: No   Additional comments if applicable:   BP (!) 142/63 (BP Location: Left Arm, Patient Position: Sitting)   Pulse 70   Temp 97.7 F (36.5 C) (Temporal)   Resp 18   Ht 5' 5.5" (1.664 m)   Wt 186 lb 8 oz (84.6 kg)   SpO2 99%   BMI 30.56 kg/m

## 2022-11-20 DIAGNOSIS — M25551 Pain in right hip: Secondary | ICD-10-CM | POA: Diagnosis not present

## 2022-11-22 ENCOUNTER — Telehealth: Payer: Self-pay | Admitting: *Deleted

## 2022-11-22 NOTE — Telephone Encounter (Signed)
Talbert Forest from (RAD ON[facility]) called office.  Pt is scheduled for a follow up on December 26  at 1:15 with Dr. Pricilla Holm.  Talbert Forest to notify pt of appointment date and time.

## 2022-11-25 ENCOUNTER — Telehealth: Payer: Self-pay | Admitting: *Deleted

## 2022-11-25 NOTE — Telephone Encounter (Signed)
Spoke with Nicole Brennan who called to move her appt. From December to February with Dr. Pricilla Holm. Pt was given an appt. On Thursday, Feb. 27 th at 1 pm. Pt agreed to date and time.

## 2022-11-27 DIAGNOSIS — M533 Sacrococcygeal disorders, not elsewhere classified: Secondary | ICD-10-CM | POA: Diagnosis not present

## 2022-11-28 ENCOUNTER — Other Ambulatory Visit (HOSPITAL_BASED_OUTPATIENT_CLINIC_OR_DEPARTMENT_OTHER): Payer: Self-pay

## 2022-12-03 ENCOUNTER — Telehealth: Payer: Self-pay | Admitting: Family Medicine

## 2022-12-03 NOTE — Telephone Encounter (Signed)
Copied from CRM [id number]. Topic: Medicare AWV >> Dec 03, 2022  3:33 PM Payton Doughty wrote: Reason for CRM: LM 12/03/2022 to schedule AWV   Verlee Rossetti; Care Guide Ambulatory Clinical Support [place] [facility] Direct Dial: [phone]

## 2022-12-18 ENCOUNTER — Other Ambulatory Visit (HOSPITAL_BASED_OUTPATIENT_CLINIC_OR_DEPARTMENT_OTHER): Payer: Self-pay

## 2022-12-18 DIAGNOSIS — H02831 Dermatochalasis of right upper eyelid: Secondary | ICD-10-CM | POA: Diagnosis not present

## 2022-12-18 DIAGNOSIS — H26491 Other secondary cataract, right eye: Secondary | ICD-10-CM | POA: Diagnosis not present

## 2022-12-18 DIAGNOSIS — Z961 Presence of intraocular lens: Secondary | ICD-10-CM | POA: Diagnosis not present

## 2022-12-18 DIAGNOSIS — H04123 Dry eye syndrome of bilateral lacrimal glands: Secondary | ICD-10-CM | POA: Diagnosis not present

## 2022-12-18 MED ORDER — PREDNISOLONE ACETATE 1 % OP SUSP
1.0000 [drp] | Freq: Four times a day (QID) | OPHTHALMIC | 0 refills | Status: DC
  Filled 2022-12-18: qty 5, 25d supply, fill #0

## 2022-12-19 ENCOUNTER — Ambulatory Visit: Payer: Medicare Other

## 2022-12-19 VITALS — Ht 65.5 in | Wt 168.0 lb

## 2022-12-19 DIAGNOSIS — Z0000 Encounter for general adult medical examination without abnormal findings: Secondary | ICD-10-CM | POA: Diagnosis not present

## 2022-12-19 NOTE — Progress Notes (Signed)
Subjective:   Nicole Brennan is a 82 y.o. female who presents for Medicare Annual (Subsequent) preventive examination.  Visit Complete: Virtual I connected with  Nicole Brennan on 12/19/22 by a audio enabled telemedicine application and verified that I am speaking with the correct person using two identifiers.  Patient Location: Home  Provider Location: Home Office  I discussed the limitations of evaluation and management by telemedicine. The patient expressed understanding and agreed to proceed.  Vital Signs: Because this visit was a virtual/telehealth visit, some criteria may be missing or patient reported. Any vitals not documented were not able to be obtained and vitals that have been documented are patient reported.    Cardiac Risk Factors include: advanced age (>18men, >68 women)     Objective:    Today's Vitals   12/19/22 1507  Weight: 168 lb (76.2 kg)  Height: 5' 5.5" (1.664 m)   Body mass index is 27.53 kg/m.     12/19/2022    3:14 PM 11/04/2022   10:45 AM 12/03/2021    9:13 AM 11/05/2021   10:53 AM 10/30/2020   11:08 AM 10/17/2020    8:24 AM 05/04/2020    8:45 AM  Advanced Directives  Does Patient Have a Medical Advance Directive? Yes Yes Yes Yes Yes Yes Yes  Type of Estate agent of [place];Living will  Healthcare Power of [place];Living will Healthcare Power of [place];Living will Healthcare Power of [place];Living will Healthcare Power of [place];Living will Healthcare Power of [place];Living will  Does patient want to make changes to medical advance directive?  No - Patient declined   No - Patient declined    Copy of Healthcare Power of Attorney in Chart? No - copy requested  No - copy requested  No - copy requested  No - copy requested    Current Medications (verified) Outpatient Encounter Medications as of 12/19/2022  Medication Sig   acetaminophen (TYLENOL) 500 MG tablet Take 1,000 mg by mouth every 6 (six) hours as needed for  headache.   Ascorbic Acid (VITAMIN C PO) Take by mouth.   Calcium-Phosphorus-Vitamin D 250-100-500 MG-MG-UNIT CHEW Chew 2 tablets by mouth at bedtime.    fluticasone (FLONASE) 50 MCG/ACT nasal spray Place 2 sprays into both nostrils daily. (Patient not taking: Reported on 11/04/2022)   levothyroxine (SYNTHROID) 75 MCG tablet Take 1 tablet (75 mcg total) by mouth daily.   Multiple Vitamins-Minerals (MULTIVITAMIN WITH MINERALS) tablet Take 1 tablet by mouth every evening. Centrum Silver   No facility-administered encounter medications on file as of 12/19/2022.    Allergies (verified) Penicillins   History: Past Medical History:  Diagnosis Date   Allergy    Arthritis    Cataract    History of radiation therapy 09/30/2018-10/28/2018   Vaginal cuff HDR; Dr. Antony Blackbird   Thyroid disease    Past Surgical History:  Procedure Laterality Date   DG  BONE DENSITY ([facility] HX)     KNEE SURGERY     ROBOTIC ASSISTED TOTAL HYSTERECTOMY WITH BILATERAL SALPINGO OOPHERECTOMY Bilateral 08/20/2018   Procedure: XI ROBOTIC ASSISTED TOTAL HYSTERECTOMY WITH BILATERAL SALPINGO OOPHORECTOMY;  Surgeon: Adolphus Birchwood, MD;  Location: [facility];  Service: Gynecology;  Laterality: Bilateral;   SENTINEL NODE BIOPSY N/A 08/20/2018   Procedure: SENTINEL LYMPH  NODE BIOPSY;  Surgeon: Adolphus Birchwood, MD;  Location: [facility];  Service: Gynecology;  Laterality: N/A;   TUBAL LIGATION     Family History  Problem Relation Age of Onset   Vascular Disease Mother  Colon cancer Mother    Dementia Father    Social History   Socioeconomic History   Marital status: Married    Spouse name: Not on file   Number of children: Not on file   Years of education: Not on file   Highest education level: Not on file  Occupational History   Not on file  Tobacco Use   Smoking status: Never   Smokeless tobacco: Never  Vaping Use   Vaping status: Never Used  Substance and Sexual Activity   Alcohol use: No   Drug use: No   Sexual  activity: Yes  Other Topics Concern   Not on file  Social History Narrative   Not on file   Social Determinants of Health   Financial Resource Strain: Low Risk  (12/19/2022)   Overall Financial Resource Strain (CARDIA)    Difficulty of Paying Living Expenses: Not hard at all  Food Insecurity: No Food Insecurity (12/19/2022)   Hunger Vital Sign    Worried About Running Out of Food in the Last Year: Never true    Ran Out of Food in the Last Year: Never true  Transportation Needs: No Transportation Needs (12/19/2022)   PRAPARE - Administrator, Civil Service (Medical): No    Lack of Transportation (Non-Medical): No  Physical Activity: Inactive (12/19/2022)   Exercise Vital Sign    Days of Exercise per Week: 0 days    Minutes of Exercise per Session: 0 min  Stress: No Stress Concern Present (12/19/2022)   [employer] of Occupational Health - Occupational Stress Questionnaire    Feeling of Stress : Not at all  Social Connections: Socially Integrated (12/19/2022)   Social Connection and Isolation Panel [NHANES]    Frequency of Communication with Friends and Family: More than three times a week    Frequency of Social Gatherings with Friends and Family: More than three times a week    Attends Religious Services: More than 4 times per year    Active Member of [employer] or Organizations: Yes    Attends Engineer, structural: More than 4 times per year    Marital Status: Married    Tobacco Counseling Counseling given: Not Answered   Clinical Intake:  Pre-visit preparation completed: Yes  Pain : No/denies pain     BMI - recorded: 27.53 Nutritional Status: BMI 25 -29 Overweight Nutritional Risks: None Diabetes: No  How often do you need to have someone help you when you read instructions, pamphlets, or other written materials from your doctor or pharmacy?: 1 - Never  Interpreter Needed?: No  Information entered by :: Theresa Mulligan LPN   Activities  of Daily Living    12/19/2022    3:13 PM  In your present state of health, do you have any difficulty performing the following activities:  Hearing? 0  Vision? 0  Difficulty concentrating or making decisions? 0  Walking or climbing stairs? 0  Dressing or bathing? 0  Doing errands, shopping? 0  Preparing Food and eating ? N  Using the Toilet? N  In the past six months, have you accidently leaked urine? N  Do you have problems with loss of bowel control? N  Managing your Medications? N  Managing your Finances? N  Housekeeping or managing your Housekeeping? N    Patient Care Team: Copland, Gwenlyn Found, MD as PCP - General (Family Medicine) Marcene Corning, MD as Consulting Physician (Orthopedic Surgery)  Indicate any recent Medical Services you may have  received from other than [facility] providers in the past year (date may be approximate).     Assessment:   This is a routine wellness examination for Evangelynn.  Hearing/Vision screen Hearing Screening - Comments:: Denies hearing difficulties   Vision Screening - Comments:: - up to date with routine eye exams with  [facility]   Goals Addressed               This Visit's Progress     Maintain current healthy lifestyle. (pt-stated)        Continue playing my music.       Depression Screen    12/19/2022    3:11 PM 09/30/2022    8:49 AM 03/25/2022    8:58 AM 12/03/2021    9:16 AM 10/17/2020    8:29 AM 09/18/2020    9:21 AM 08/10/2020   10:00 AM  PHQ 2/9 Scores  PHQ - 2 Score 0 0 0 0 0 0 0    Fall Risk    12/19/2022    3:13 PM 09/30/2022    8:49 AM 03/25/2022    8:58 AM 12/03/2021    9:15 AM 10/17/2020    8:27 AM  Fall Risk   Falls in the past year? 0 0 0 0 0  Number falls in past yr: 0 0 0 0 0  Injury with Fall? 0 0  0 0  Risk for fall due to : No Fall Risks No Fall Risks No Fall Risks    Follow up Falls prevention discussed Falls evaluation completed Falls evaluation completed Falls prevention discussed Falls prevention  discussed    MEDICARE RISK AT HOME: Medicare Risk at Home Any stairs in or around the home?: No If so, are there any without handrails?: No Home free of loose throw rugs in walkways, pet beds, electrical cords, etc?: Yes Adequate lighting in your home to reduce risk of falls?: Yes Life alert?: No Use of a cane, walker or w/c?: No Grab bars in the bathroom?: No Shower chair or bench in shower?: No Elevated toilet seat or a handicapped toilet?: No  TIMED UP AND GO:  Was the test performed?  No    Cognitive Function:    07/01/2016   11:22 AM  MMSE - Mini Mental State Exam  Orientation to time 5  Orientation to Place 5  Registration 3  Attention/ Calculation 5  Recall 3  Language- name 2 objects 2  Language- repeat 1  Language- follow 3 step command 3  Language- read & follow direction 1  Write a sentence 1  Copy design 1  Total score 30        12/19/2022    3:15 PM 12/03/2021    9:20 AM  6CIT Screen  What Year? 0 points 0 points  What month? 0 points 0 points  What time? 0 points 0 points  Count back from 20 0 points 0 points  Months in reverse 0 points 0 points  Repeat phrase 0 points 0 points  Total Score 0 points 0 points    Immunizations Immunization History  Administered Date(s) Administered   Fluad Quad(high Dose 65+) 12/01/2018, 12/09/2019, 03/25/2022   Influenza Whole 12/10/2011   Influenza, High Dose Seasonal PF 01/22/2016, 12/26/2016, 12/30/2017   Influenza,inj,Quad PF,6+ Mos 12/21/2013, 11/15/2014   PFIZER(Purple Top)SARS-COV-2 Vaccination 03/27/2019, 04/17/2019   Pneumococcal Conjugate-13 10/25/2013   Pneumococcal Polysaccharide-23 10/15/2017   Pneumococcal-Unspecified 03/12/2003   Td 08/10/2020   Tdap 03/12/2007   Zoster Recombinant(Shingrix) 01/09/2022, 04/07/2022  Zoster, Live 03/11/2008    TDAP status: Up to date  Flu Vaccine status: Due, Education has been provided regarding the importance of this vaccine. Advised may receive this  vaccine at local pharmacy or Health Dept. Aware to provide a copy of the vaccination record if obtained from local pharmacy or Health Dept. Verbalized acceptance and understanding.  Pneumococcal vaccine status: Up to date  Covid-19 vaccine status: Declined, Education has been provided regarding the importance of this vaccine but patient still declined. Advised may receive this vaccine at local pharmacy or Health Dept.or vaccine clinic. Aware to provide a copy of the vaccination record if obtained from local pharmacy or Health Dept. Verbalized acceptance and understanding.  Qualifies for Shingles Vaccine? Yes   Zostavax completed Yes   Shingrix Completed?: Yes  Screening Tests Health Maintenance  Topic Date Due   COVID-19 Vaccine (3 - Pfizer risk series) 05/15/2019   INFLUENZA VACCINE  10/10/2022   Medicare Annual Wellness (AWV)  12/19/2023   DTaP/Tdap/Td (3 - Td or Tdap) 08/11/2030   Pneumonia Vaccine 58+ Years old  Completed   DEXA SCAN  Completed   Zoster Vaccines- Shingrix  Completed   HPV VACCINES  Aged Out   Hepatitis C Screening  Discontinued    Health Maintenance  Health Maintenance Due  Topic Date Due   COVID-19 Vaccine (3 - Pfizer risk series) 05/15/2019   INFLUENZA VACCINE  10/10/2022        Bone Density status: Completed 11/29/21. Results reflect: Bone density results: OSTEOPENIA. Repeat every   years.     Additional Screening:    Vision Screening: Recommended annual ophthalmology exams for early detection of glaucoma and other disorders of the eye. Is the patient up to date with their annual eye exam?  Yes  Who is the provider or what is the name of the office in which the patient attends annual eye exams? [facility] If pt is not established with a provider, would they like to be referred to a provider to establish care? No .   Dental Screening: Recommended annual dental exams for proper oral hygiene  Community Resource Referral / Chronic Care  Management:  CRR required this visit?  No   CCM required this visit?  No     Plan:     I have personally reviewed and noted the following in the patient's chart:   Medical and social history Use of alcohol, tobacco or illicit drugs  Current medications and supplements including opioid prescriptions. Patient is not currently taking opioid prescriptions. Functional ability and status Nutritional status Physical activity Advanced directives List of other physicians Hospitalizations, surgeries, and ER visits in previous 12 months Vitals Screenings to include cognitive, depression, and falls Referrals and appointments  In addition, I have reviewed and discussed with patient certain preventive protocols, quality metrics, and best practice recommendations. A written personalized care plan for preventive services as well as general preventive health recommendations were provided to patient.     Tillie Rung, LPN   [id number]   After Visit Summary: (MyChart) Due to this being a telephonic visit, the after visit summary with patients personalized plan was offered to patient via MyChart   Nurse Notes: None

## 2022-12-19 NOTE — Patient Instructions (Addendum)
Ms. Mcwright , Thank you for taking time to come for your Medicare Wellness Visit. I appreciate your ongoing commitment to your health goals. Please review the following plan we discussed and let me know if I can assist you in the future.   Referrals/Orders/Follow-Ups/Clinician Recommendations:   This is a list of the screening recommended for you and due dates:  Health Maintenance  Topic Date Due   COVID-19 Vaccine (3 - Pfizer risk series) 05/15/2019   Flu Shot  10/10/2022   Medicare Annual Wellness Visit  12/19/2023   DTaP/Tdap/Td vaccine (3 - Td or Tdap) 08/11/2030   Pneumonia Vaccine  Completed   DEXA scan (bone density measurement)  Completed   Zoster (Shingles) Vaccine  Completed   HPV Vaccine  Aged Out   Hepatitis C Screening  Discontinued    Advanced directives: (Copy Requested) Please bring a copy of your health care power of attorney and living will to the office to be added to your chart at your convenience.  Next Medicare Annual Wellness Visit scheduled for next year: Yes

## 2023-01-30 ENCOUNTER — Other Ambulatory Visit (HOSPITAL_BASED_OUTPATIENT_CLINIC_OR_DEPARTMENT_OTHER): Payer: Self-pay

## 2023-01-31 ENCOUNTER — Other Ambulatory Visit (HOSPITAL_BASED_OUTPATIENT_CLINIC_OR_DEPARTMENT_OTHER): Payer: Self-pay

## 2023-02-19 ENCOUNTER — Telehealth: Payer: Self-pay

## 2023-02-19 NOTE — Telephone Encounter (Signed)
Per provider request. Ms.Goettl has been rescheduled from 05/08/23 @ 1:00 to 3/28 @ 8:45. Pt requested AM appointment only d/t work schedule.

## 2023-02-27 ENCOUNTER — Other Ambulatory Visit (HOSPITAL_BASED_OUTPATIENT_CLINIC_OR_DEPARTMENT_OTHER): Payer: Self-pay

## 2023-03-06 ENCOUNTER — Ambulatory Visit: Payer: Medicare Other | Admitting: Gynecologic Oncology

## 2023-03-14 ENCOUNTER — Encounter: Payer: Self-pay | Admitting: Physician Assistant

## 2023-03-14 ENCOUNTER — Ambulatory Visit (INDEPENDENT_AMBULATORY_CARE_PROVIDER_SITE_OTHER): Payer: Medicare Other | Admitting: Physician Assistant

## 2023-03-14 VITALS — BP 134/81 | HR 81 | Temp 98.7°F | Ht 65.5 in | Wt 185.1 lb

## 2023-03-14 DIAGNOSIS — H6123 Impacted cerumen, bilateral: Secondary | ICD-10-CM

## 2023-03-14 DIAGNOSIS — R0981 Nasal congestion: Secondary | ICD-10-CM

## 2023-03-14 NOTE — Progress Notes (Signed)
      Established patient visit   Patient: Nicole Brennan   DOB: 1940/05/25   83 y.o. Female  MRN: [id number] Visit Date: 03/14/2023  Today's healthcare provider: Manuelita Flatness, PA-C   Cc. Ear fullness  Subjective     Pt reports b/l ear fullness, L>R for the past few days. She feels that there might be water  stuck behind her L ear. Reports some nasal congestion the last few days. Denies headaches, ear pain.  Medications: Outpatient Medications Prior to Visit  Medication Sig   acetaminophen  (TYLENOL ) 500 MG tablet Take 1,000 mg by mouth every 6 (six) hours as needed for headache.   Calcium-Phosphorus-Vitamin D  250-100-500 MG-MG-UNIT CHEW Chew 2 tablets by mouth at bedtime.    levothyroxine  (SYNTHROID ) 75 MCG tablet Take 1 tablet (75 mcg total) by mouth daily.   [DISCONTINUED] Ascorbic Acid (VITAMIN C PO) Take by mouth.   [DISCONTINUED] fluticasone  (FLONASE ) 50 MCG/ACT nasal spray Place 2 sprays into both nostrils daily. (Patient not taking: Reported on 11/04/2022)   [DISCONTINUED] Multiple Vitamins-Minerals (MULTIVITAMIN WITH MINERALS) tablet Take 1 tablet by mouth every evening. Centrum Silver   No facility-administered medications prior to visit.    Review of Systems  Constitutional:  Negative for fatigue and fever.  HENT:         Ear fullness  Respiratory:  Negative for cough and shortness of breath.   Cardiovascular:  Negative for chest pain and leg swelling.  Gastrointestinal:  Negative for abdominal pain.  Neurological:  Negative for dizziness and headaches.       Objective    BP 134/81   Pulse 81   Temp 98.7 F (37.1 C) (Oral)   Ht 5' 5.5 (1.664 m)   Wt 185 lb 2 oz (84 kg)   SpO2 97%   BMI 30.34 kg/m    Physical Exam Vitals reviewed.  Constitutional:      Appearance: She is not ill-appearing.  HENT:     Head: Normocephalic.     Ears:     Comments: B/l cerumen impaction; after flush, there is still wax present but no longer impacted  Bilateral TM  appear normal Eyes:     Conjunctiva/sclera: Conjunctivae normal.  Cardiovascular:     Rate and Rhythm: Normal rate.  Pulmonary:     Effort: Pulmonary effort is normal. No respiratory distress.  Neurological:     General: No focal deficit present.     Mental Status: She is alert and oriented to person, place, and time.  Psychiatric:        Mood and Affect: Mood normal.        Behavior: Behavior normal.      No results found for any visits on 03/14/23.  Assessment & Plan    Bilateral impacted cerumen  Nasal congestion  Advised pt we flush b/l ears. She consented to procedure, completed by CMA  Minimal was flushed out, no longer impacted but ears still have wax. TM appear normal   Recommending otc antihistamines for ongoing nasal congestion  Return if symptoms worsen or fail to improve.       Manuelita Flatness, PA-C  [facility] [phone] (phone) [phone] (fax)  [facility]

## 2023-03-24 DIAGNOSIS — B880 Other acariasis: Secondary | ICD-10-CM | POA: Diagnosis not present

## 2023-03-24 DIAGNOSIS — Z961 Presence of intraocular lens: Secondary | ICD-10-CM | POA: Diagnosis not present

## 2023-03-24 DIAGNOSIS — H04123 Dry eye syndrome of bilateral lacrimal glands: Secondary | ICD-10-CM | POA: Diagnosis not present

## 2023-03-24 DIAGNOSIS — H02831 Dermatochalasis of right upper eyelid: Secondary | ICD-10-CM | POA: Diagnosis not present

## 2023-04-05 NOTE — Patient Instructions (Incomplete)
It was great to see you again today, I will be in touch with your labs.  Assuming all is well please see me in about 6 months.  Recommend COVID booster if none the last 6 months or so, recommend dose of RSV

## 2023-04-05 NOTE — Progress Notes (Unsigned)
[facility] [address], [address] [place], [place] [address] [phone]  Date:  04/07/2023   Name:  Nicole Brennan   DOB:  1940-06-18   MRN:  [id number]  PCP:  Pearline Cables, MD    Chief Complaint: No chief complaint on file.   History of Present Illness:  Nicole Brennan is a 83 y.o. very pleasant female patient who presents with the following:  Patient seen today for periodic follow-up Most recent visit with myself was in August for laryngitis, we had our typical scheduled follow-up in July Married to [place], they both continue to work. She is a professional pianist   History of hypothyroidism, osteoporosis, endometrial cancer dx 07/2018, prediabetes  She is seen annually by radiation oncology at this time-visit with Dr. Roselind Messier in August-doing well, no clinical evidence of recurrence She also follows up with her gynecologic oncologist, Dr. Pricilla Holm and will see her soon  She has also been following up with orthopedics and physical medicine and rehab for some hip issues  COVID booster Flu shot Mammogram June 2024 DEXA scan September 2023 CT coronary calcium performed 2024, score of 0  Blood work on chart from June, CMP and CBC, A1c, TSH  Taking calcium and vitamin D Levothyroxine 75  Patient Active Problem List   Diagnosis Date Noted   Prediabetes 09/19/2020   Lichen sclerosus 08/03/2019   Endometrial carcinoma (HCC) 08/05/2018   Obesity (BMI 30.0-34.9) 08/05/2018   Seasonal allergies 04/21/2012   Cataract 04/21/2012   Osteoporosis 05/01/2011   Hypothyroidism 04/30/2011   Osteoarthritis 04/30/2011    Past Medical History:  Diagnosis Date   Allergy    Arthritis    Cataract    History of radiation therapy 09/30/2018-10/28/2018   Vaginal cuff HDR; Dr. Antony Blackbird   Thyroid disease     Past Surgical History:  Procedure Laterality Date   DG  BONE DENSITY ([facility] HX)     KNEE SURGERY     ROBOTIC ASSISTED  TOTAL HYSTERECTOMY WITH BILATERAL SALPINGO OOPHERECTOMY Bilateral 08/20/2018   Procedure: XI ROBOTIC ASSISTED TOTAL HYSTERECTOMY WITH BILATERAL SALPINGO OOPHORECTOMY;  Surgeon: Adolphus Birchwood, MD;  Location: [facility];  Service: Gynecology;  Laterality: Bilateral;   SENTINEL NODE BIOPSY N/A 08/20/2018   Procedure: SENTINEL LYMPH  NODE BIOPSY;  Surgeon: Adolphus Birchwood, MD;  Location: [facility];  Service: Gynecology;  Laterality: N/A;   TUBAL LIGATION      Social History   Tobacco Use   Smoking status: Never   Smokeless tobacco: Never  Vaping Use   Vaping status: Never Used  Substance Use Topics   Alcohol use: No   Drug use: No    Family History  Problem Relation Age of Onset   Vascular Disease Mother    Colon cancer Mother    Dementia Father     Allergies  Allergen Reactions   Penicillins Hives    Did it involve swelling of the face/tongue/throat, SOB, or low BP? No Did it involve sudden or severe rash/hives, skin peeling, or any reaction on the inside of your mouth or nose? Unknown Did you need to seek medical attention at a hospital or doctor's office? Yes When did it last happen?Childhood reaction If all above answers are "NO", may proceed with cephalosporin use.     Medication list has been reviewed and updated.  Current Outpatient Medications on File Prior to Visit  Medication Sig Dispense Refill   acetaminophen (TYLENOL) 500  MG tablet Take 1,000 mg by mouth every 6 (six) hours as needed for headache.     Calcium-Phosphorus-Vitamin D 250-100-500 MG-MG-UNIT CHEW Chew 2 tablets by mouth at bedtime.      levothyroxine (SYNTHROID) 75 MCG tablet Take 1 tablet (75 mcg total) by mouth daily. 90 tablet 3   No current facility-administered medications on file prior to visit.    Review of Systems:  As per HPI- otherwise negative.   Physical Examination: There were no vitals filed for this visit. There were no vitals filed for this visit. There is no height or weight on file to  calculate BMI. Ideal Body Weight:    GEN: no acute distress. HEENT: Atraumatic, Normocephalic.  Ears and Nose: No external deformity. CV: RRR, No M/G/R. No JVD. No thrill. No extra heart sounds. PULM: CTA B, no wheezes, crackles, rhonchi. No retractions. No resp. distress. No accessory muscle use. ABD: S, NT, ND, +BS. No rebound. No HSM. EXTR: No c/c/e PSYCH: Normally interactive. Conversant.    Assessment and Plan: ***  Signed Abbe Amsterdam, MD

## 2023-04-07 ENCOUNTER — Encounter: Payer: Self-pay | Admitting: Family Medicine

## 2023-04-07 ENCOUNTER — Ambulatory Visit (INDEPENDENT_AMBULATORY_CARE_PROVIDER_SITE_OTHER): Payer: Medicare Other | Admitting: Family Medicine

## 2023-04-07 VITALS — BP 122/72 | HR 81 | Temp 97.6°F | Resp 18 | Ht 65.5 in | Wt 184.6 lb

## 2023-04-07 DIAGNOSIS — Z13220 Encounter for screening for lipoid disorders: Secondary | ICD-10-CM

## 2023-04-07 DIAGNOSIS — Z130 Encounter for screening for diseases of the blood and blood-forming organs and certain disorders involving the immune mechanism: Secondary | ICD-10-CM | POA: Diagnosis not present

## 2023-04-07 DIAGNOSIS — R7303 Prediabetes: Secondary | ICD-10-CM | POA: Diagnosis not present

## 2023-04-07 DIAGNOSIS — M810 Age-related osteoporosis without current pathological fracture: Secondary | ICD-10-CM | POA: Diagnosis not present

## 2023-04-07 DIAGNOSIS — E038 Other specified hypothyroidism: Secondary | ICD-10-CM

## 2023-04-07 DIAGNOSIS — C541 Malignant neoplasm of endometrium: Secondary | ICD-10-CM | POA: Diagnosis not present

## 2023-04-07 DIAGNOSIS — M19042 Primary osteoarthritis, left hand: Secondary | ICD-10-CM

## 2023-04-07 DIAGNOSIS — M19041 Primary osteoarthritis, right hand: Secondary | ICD-10-CM

## 2023-04-07 LAB — COMPREHENSIVE METABOLIC PANEL
ALT: 17 U/L (ref 0–35)
AST: 21 U/L (ref 0–37)
Albumin: 4.3 g/dL (ref 3.5–5.2)
Alkaline Phosphatase: 54 U/L (ref 39–117)
BUN: 11 mg/dL (ref 6–23)
CO2: 28 meq/L (ref 19–32)
Calcium: 9.7 mg/dL (ref 8.4–10.5)
Chloride: 106 meq/L (ref 96–112)
Creatinine, Ser: 0.9 mg/dL (ref 0.40–1.20)
GFR: 60.12 mL/min (ref >60.00)
Glucose, Bld: 91 mg/dL (ref 70–99)
Potassium: 4.3 meq/L (ref 3.5–5.1)
Sodium: 142 meq/L (ref 135–145)
Total Bilirubin: 0.4 mg/dL (ref 0.2–1.2)
Total Protein: 6.3 g/dL (ref 6.0–8.3)

## 2023-04-07 LAB — HEMOGLOBIN A1C: Hgb A1c MFr Bld: 6 % (ref 4.6–6.5)

## 2023-04-07 LAB — CBC
HCT: 37.6 % (ref 36.0–46.0)
Hemoglobin: 12.6 g/dL (ref 12.0–15.0)
MCHC: 33.6 g/dL (ref 30.0–36.0)
MCV: 100 fL (ref 78.0–100.0)
Platelets: 360 10*3/uL (ref 150.0–400.0)
RBC: 3.75 Mil/uL — ABNORMAL LOW (ref 3.87–5.11)
RDW: 13.2 % (ref 11.5–15.5)
WBC: 4.8 10*3/uL (ref 4.0–10.5)

## 2023-04-07 LAB — LIPID PANEL
Cholesterol: 192 mg/dL (ref 0–200)
HDL: 59.2 mg/dL (ref >39.00)
LDL Cholesterol: 107 mg/dL — ABNORMAL HIGH (ref 0–99)
NonHDL: 132.75
Total CHOL/HDL Ratio: 3
Triglycerides: 127 mg/dL (ref 0.0–149.0)
VLDL: 25.4 mg/dL (ref 0.0–40.0)

## 2023-04-07 LAB — TSH: TSH: 2.41 u[IU]/mL (ref 0.35–5.50)

## 2023-04-16 DIAGNOSIS — H01005 Unspecified blepharitis left lower eyelid: Secondary | ICD-10-CM | POA: Diagnosis not present

## 2023-04-16 DIAGNOSIS — H01002 Unspecified blepharitis right lower eyelid: Secondary | ICD-10-CM | POA: Diagnosis not present

## 2023-04-16 DIAGNOSIS — H04123 Dry eye syndrome of bilateral lacrimal glands: Secondary | ICD-10-CM | POA: Diagnosis not present

## 2023-04-16 DIAGNOSIS — B880 Other acariasis: Secondary | ICD-10-CM | POA: Diagnosis not present

## 2023-04-22 DIAGNOSIS — K080 Exfoliation of teeth due to systemic causes: Secondary | ICD-10-CM | POA: Diagnosis not present

## 2023-05-05 ENCOUNTER — Telehealth: Payer: Self-pay | Admitting: Family Medicine

## 2023-05-05 NOTE — Telephone Encounter (Signed)
 Patient would like a call back regarding some billing codes she says she needs a call by 1:00

## 2023-05-06 NOTE — Telephone Encounter (Signed)
 LMOVM stating that our billing dept may be able to assist with billing codes for pt [phone]. Pt can also reach back out to me and I will try to assist.

## 2023-05-08 ENCOUNTER — Ambulatory Visit: Payer: Medicare Other | Admitting: Gynecologic Oncology

## 2023-05-14 ENCOUNTER — Encounter: Payer: Self-pay | Admitting: Family Medicine

## 2023-05-14 DIAGNOSIS — U071 COVID-19: Secondary | ICD-10-CM

## 2023-05-26 DIAGNOSIS — H10823 Rosacea conjunctivitis, bilateral: Secondary | ICD-10-CM | POA: Diagnosis not present

## 2023-05-26 DIAGNOSIS — H01005 Unspecified blepharitis left lower eyelid: Secondary | ICD-10-CM | POA: Diagnosis not present

## 2023-05-26 DIAGNOSIS — H01002 Unspecified blepharitis right lower eyelid: Secondary | ICD-10-CM | POA: Diagnosis not present

## 2023-06-03 ENCOUNTER — Other Ambulatory Visit (HOSPITAL_BASED_OUTPATIENT_CLINIC_OR_DEPARTMENT_OTHER): Payer: Self-pay

## 2023-06-03 MED ORDER — NIRMATRELVIR/RITONAVIR (PAXLOVID) TABLET (RENAL DOSING)
2.0000 | ORAL_TABLET | Freq: Two times a day (BID) | ORAL | 0 refills | Status: AC
Start: 2023-06-03 — End: 2023-06-08
  Filled 2023-06-03: qty 20, 5d supply, fill #0

## 2023-06-04 ENCOUNTER — Telehealth: Payer: Self-pay | Admitting: *Deleted

## 2023-06-04 NOTE — Telephone Encounter (Signed)
 Spoke with Ms. Rinke who called the office stating she needs to reschedule her apt. With Dr. Pricilla Holm on Friday, 3/28 at 0845 because she has Covid. Pt was offered an apt. With Dr. Tamela Oddi on 4/8. Pt requested only to see Dr. Pricilla Holm. First available apt. With Dr. Pricilla Holm is Friday, May 29 th at 3:15 pm. Pt agreed to date and time and had no further concerns.

## 2023-06-06 ENCOUNTER — Ambulatory Visit: Payer: Medicare Other | Admitting: Gynecologic Oncology

## 2023-06-06 DIAGNOSIS — C541 Malignant neoplasm of endometrium: Secondary | ICD-10-CM

## 2023-06-09 ENCOUNTER — Other Ambulatory Visit: Payer: Self-pay | Admitting: Family Medicine

## 2023-06-09 ENCOUNTER — Other Ambulatory Visit (HOSPITAL_BASED_OUTPATIENT_CLINIC_OR_DEPARTMENT_OTHER): Payer: Self-pay

## 2023-06-09 DIAGNOSIS — E038 Other specified hypothyroidism: Secondary | ICD-10-CM

## 2023-06-10 ENCOUNTER — Other Ambulatory Visit (HOSPITAL_BASED_OUTPATIENT_CLINIC_OR_DEPARTMENT_OTHER): Payer: Self-pay

## 2023-06-10 ENCOUNTER — Other Ambulatory Visit: Payer: Self-pay

## 2023-06-10 MED ORDER — LEVOTHYROXINE SODIUM 75 MCG PO TABS
75.0000 ug | ORAL_TABLET | Freq: Every day | ORAL | 1 refills | Status: DC
Start: 2023-06-10 — End: 2023-12-01
  Filled 2023-06-10: qty 90, 90d supply, fill #0
  Filled 2023-09-08: qty 90, 90d supply, fill #1

## 2023-07-03 ENCOUNTER — Other Ambulatory Visit (HOSPITAL_BASED_OUTPATIENT_CLINIC_OR_DEPARTMENT_OTHER): Payer: Self-pay

## 2023-07-14 ENCOUNTER — Other Ambulatory Visit (HOSPITAL_BASED_OUTPATIENT_CLINIC_OR_DEPARTMENT_OTHER): Payer: Self-pay

## 2023-08-05 ENCOUNTER — Encounter: Payer: Self-pay | Admitting: Gynecologic Oncology

## 2023-08-07 ENCOUNTER — Encounter: Payer: Self-pay | Admitting: Gynecologic Oncology

## 2023-08-07 ENCOUNTER — Inpatient Hospital Stay: Attending: Gynecologic Oncology | Admitting: Gynecologic Oncology

## 2023-08-07 VITALS — BP 136/62 | HR 67 | Temp 97.7°F | Resp 16 | Ht 65.5 in | Wt 182.2 lb

## 2023-08-07 DIAGNOSIS — Z90710 Acquired absence of both cervix and uterus: Secondary | ICD-10-CM | POA: Diagnosis not present

## 2023-08-07 DIAGNOSIS — Z9079 Acquired absence of other genital organ(s): Secondary | ICD-10-CM | POA: Insufficient documentation

## 2023-08-07 DIAGNOSIS — Z8542 Personal history of malignant neoplasm of other parts of uterus: Secondary | ICD-10-CM | POA: Diagnosis not present

## 2023-08-07 DIAGNOSIS — Z08 Encounter for follow-up examination after completed treatment for malignant neoplasm: Secondary | ICD-10-CM | POA: Insufficient documentation

## 2023-08-07 DIAGNOSIS — Z923 Personal history of irradiation: Secondary | ICD-10-CM | POA: Diagnosis not present

## 2023-08-07 DIAGNOSIS — Z90722 Acquired absence of ovaries, bilateral: Secondary | ICD-10-CM | POA: Diagnosis not present

## 2023-08-07 DIAGNOSIS — C541 Malignant neoplasm of endometrium: Secondary | ICD-10-CM

## 2023-08-07 NOTE — Patient Instructions (Signed)
 It was good to see you today.  I do not see or feel any evidence of cancer recurrence on your exam.  Nicole Brennan will see you for follow-up in August 2026.  As always, if you develop any new and concerning symptoms before your next visit, please call to see me sooner.

## 2023-08-07 NOTE — Progress Notes (Signed)
 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 The patient's history began in May 2020 when she began experiencing vaginal spotting and was seen by Dr. Crawford Dock performed a transvaginal ultrasound scan which showed a thickened endometrium which then prompted an endometrial biopsy which was performed on Jul 27, 2018.  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   On August 20, 2018 she underwent a robotic assisted total hysterectomy, BSO sentinel lymph node biopsy.  Intraoperative findings were unremarkable for extrauterine disease.  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  Sentinel lymph nodes were negative for metastatic disease.  The cervix, and adnexa are also negative for metastatic disease.  There was no lymphovascular or perineural invasion seen.   Due to her deep myometrial invasion and at her age she met criteria for intermediate to high risk for recurrence.  She was recommended adjuvant radiation to the vaginal cuff in accordance with NCCN guidelines.   She received vaginal cuff brachytherapy (30 Martina Sledge in 5 fractions) between the dates of July 22 and October 28, 2018.  She tolerated treatment well.  Interval History: Doing well.  Denies any vaginal bleeding or discharge.  Reports some nocturia although drinking up until the time she goes to bed.  Reports baseline bowel function.  Looking forward to going to her 64th high school reunion this summer.  Past Medical/Surgical History: Past  Medical History:  Diagnosis Date   Allergy    Arthritis    Cataract    History of radiation therapy 09/30/2018-10/28/2018   Vaginal cuff HDR; Dr. Retta Caster   Thyroid  disease     Past Surgical History:  Procedure Laterality Date   DG  BONE DENSITY ([facility] HX)     KNEE SURGERY     ROBOTIC ASSISTED TOTAL HYSTERECTOMY WITH BILATERAL SALPINGO OOPHERECTOMY Bilateral 08/20/2018   Procedure: XI ROBOTIC ASSISTED TOTAL HYSTERECTOMY WITH BILATERAL SALPINGO OOPHORECTOMY;  Surgeon: Alphonso Aschoff, MD;  Location: [facility];  Service: Gynecology;  Laterality: Bilateral;   SENTINEL NODE BIOPSY N/A 08/20/2018   Procedure: SENTINEL LYMPH  NODE BIOPSY;  Surgeon: Alphonso Aschoff, MD;  Location: [facility];  Service: Gynecology;  Laterality: N/A;   TUBAL LIGATION      Family History  Problem Relation Age of Onset   Vascular Disease Mother    Colon cancer Mother    Dementia Father     Social History   Socioeconomic History   Marital status: Married    Spouse name: Not on file   Number of children: Not on file   Years of education: Not on file   Highest education level: Not on file  Occupational History   Not on file  Tobacco Use   Smoking status: Never   Smokeless tobacco: Never  Vaping Use   Vaping status: Never Used  Substance and Sexual Activity   Alcohol use: No   Drug use: No   Sexual activity: Yes  Other Topics Concern   Not on file  Social History Narrative   Not on file   Social Drivers of Health  Financial Resource Strain: Low Risk  (12/19/2022)   Overall Financial Resource Strain (CARDIA)    Difficulty of Paying Living Expenses: Not hard at all  Food Insecurity: No Food Insecurity (12/19/2022)   Hunger Vital Sign    Worried About Running Out of Food in the Last Year: Never true    Ran Out of Food in the Last Year: Never true  Transportation Needs: No Transportation Needs (12/19/2022)   PRAPARE - Administrator, Civil Service (Medical): No    Lack of Transportation  (Non-Medical): No  Physical Activity: Inactive (12/19/2022)   Exercise Vital Sign    Days of Exercise per Week: 0 days    Minutes of Exercise per Session: 0 min  Stress: No Stress Concern Present (12/19/2022)   [employer] of Occupational Health - Occupational Stress Questionnaire    Feeling of Stress : Not at all  Social Connections: Socially Integrated (12/19/2022)   Social Connection and Isolation Panel [NHANES]    Frequency of Communication with Friends and Family: More than three times a week    Frequency of Social Gatherings with Friends and Family: More than three times a week    Attends Religious Services: More than 4 times per year    Active Member of [employer] or Organizations: Yes    Attends Engineer, structural: More than 4 times per year    Marital Status: Married    Current Medications:  Current Outpatient Medications:    levothyroxine  (SYNTHROID ) 75 MCG tablet, Take 1 tablet (75 mcg total) by mouth daily before breakfast., Disp: 90 tablet, Rfl: 1  Review of Systems: + Fatigue, hearing loss, vision problems Denies appetite changes, fevers, chills, unexplained weight changes. Denies neck lumps or masses, mouth sores, ringing in ears or voice changes. Denies cough or wheezing.  Denies shortness of breath. Denies chest pain or palpitations. Denies leg swelling. Denies abdominal distention, pain, blood in stools, constipation, diarrhea, nausea, vomiting, or early satiety. Denies pain with intercourse, dysuria, frequency, hematuria or incontinence. Denies hot flashes, pelvic pain, vaginal bleeding or vaginal discharge.   Denies joint pain, back pain or muscle pain/cramps. Denies itching, rash, or wounds. Denies dizziness, headaches, numbness or seizures. Denies swollen lymph nodes or glands, denies easy bruising or bleeding. Denies anxiety, depression, confusion, or decreased concentration.  Physical Exam: BP 136/62 Comment: manual recheck  Pulse 67   Temp  97.7 F (36.5 C) (Oral)   Resp 16   Ht 5' 5.5" (1.664 m)   Wt 182 lb 3.2 oz (82.6 kg)   SpO2 99%   BMI 29.86 kg/m  General: Alert, oriented, no acute distress. HEENT: Normocephalic, atraumatic, sclera anicteric. Chest: Clear to auscultation bilaterally.  No wheezes or rhonchi. Cardiovascular: Regular rate and rhythm, no murmurs. Abdomen: Obese, soft, nontender.  Normoactive bowel sounds.  No masses or hepatosplenomegaly appreciated.  Well-healed incisions. Extremities: Grossly normal range of motion.  Warm, well perfused.  No edema bilaterally. Skin: No rashes or lesions noted. Lymphatics: No cervical, supraclavicular, or inguinal adenopathy. GU: Normal appearing external genitalia without erythema, excoriation, or lesions.  Speculum exam reveals mildly atrophic vaginal mucosa, radiation changes apparent, no bleeding or discharge, no masses.  Bimanual exam reveals cuff intact, no nodularity or masses.  Both speculum exam and bimanual exam are tolerated with some discomfort.  Rectovaginal exam confirms these findings.  Laboratory & Radiologic Studies: None new  Assessment & Plan: Nicole Brennan is a 83 y.o. woman with with stage IB grade 1 endometrial cancer.  MMR intact, MSS. Deep myometrial invasion. S/p adjuvant radiation (vaginal brachytherapy) - completed 10/28/18.   No evidence of disease.  Patient continues to do very well, remains asymptomatic.   Per NCCN surveillance recommendations, she will complete 5 years of surveillance as of August when she sees Dr. Eloise Hake.  If she remains NED at that time, she can transition to yearly visits with her OB/GYN.  I also offered that if she would like to see Lovett Ruck here as a transition for 1-2 years.  Her preference was to see Melissa at least for a transition period.  She will be scheduled to see Melissa in August 2026.   We reviewed signs and symptoms that would be concerning for disease recurrence and I stressed the importance of calling to  see me sooner if she develops any of these  22 minutes of total time was spent for this patient encounter, including preparation, face-to-face counseling with the patient and coordination of care, and documentation of the encounter.  Wiley Hanger, MD  Division of Gynecologic Oncology  Department of Obstetrics and Gynecology  [facility]

## 2023-08-11 DIAGNOSIS — M25511 Pain in right shoulder: Secondary | ICD-10-CM | POA: Diagnosis not present

## 2023-08-26 DIAGNOSIS — M25511 Pain in right shoulder: Secondary | ICD-10-CM | POA: Diagnosis not present

## 2023-09-09 ENCOUNTER — Other Ambulatory Visit (HOSPITAL_BASED_OUTPATIENT_CLINIC_OR_DEPARTMENT_OTHER): Payer: Self-pay

## 2023-09-16 DIAGNOSIS — M67911 Unspecified disorder of synovium and tendon, right shoulder: Secondary | ICD-10-CM | POA: Diagnosis not present

## 2023-09-16 DIAGNOSIS — M24811 Other specific joint derangements of right shoulder, not elsewhere classified: Secondary | ICD-10-CM | POA: Diagnosis not present

## 2023-09-22 ENCOUNTER — Other Ambulatory Visit (HOSPITAL_BASED_OUTPATIENT_CLINIC_OR_DEPARTMENT_OTHER): Payer: Self-pay | Admitting: Family Medicine

## 2023-09-22 DIAGNOSIS — Z1231 Encounter for screening mammogram for malignant neoplasm of breast: Secondary | ICD-10-CM

## 2023-09-29 ENCOUNTER — Ambulatory Visit: Admitting: Family Medicine

## 2023-09-30 ENCOUNTER — Encounter (HOSPITAL_BASED_OUTPATIENT_CLINIC_OR_DEPARTMENT_OTHER): Payer: Self-pay

## 2023-09-30 ENCOUNTER — Ambulatory Visit (HOSPITAL_BASED_OUTPATIENT_CLINIC_OR_DEPARTMENT_OTHER)
Admission: RE | Admit: 2023-09-30 | Discharge: 2023-09-30 | Disposition: A | Discharge status: Home / Self Care | Source: Ambulatory Visit | Attending: Family Medicine | Admitting: Family Medicine

## 2023-09-30 DIAGNOSIS — Z1231 Encounter for screening mammogram for malignant neoplasm of breast: Secondary | ICD-10-CM | POA: Insufficient documentation

## 2023-10-06 ENCOUNTER — Ambulatory Visit: Payer: Medicare Other | Admitting: Family Medicine

## 2023-10-08 NOTE — Patient Instructions (Addendum)
 It was great to see you again today, I will be in touch with your labs.  I do recommend a COVID booster if none none the last 6 months or so.  Also recommend 1 dose of RSV vaccine  I think you have some rotator cuff tendonitis in your left shoulder- I do think doing some formal PT for at least a couple of sessions and then go with a home program   I will refer you to PT here at [facility]   Please see me in about 6 months   Perhaps try some OTC melatonin for sleep!

## 2023-10-08 NOTE — Progress Notes (Signed)
 [facility] [address], [address] [place], [place] [address] [phone]  Date:  10/13/2023   Name:  Nicole Brennan   DOB:  08-Nov-1940   MRN:  [id number]  PCP:  Watt Harlene BROCKS, MD    Chief Complaint: Follow-up (Pt states she had a fall 08/08/2023, pt has been seen by ortho however is still having some pain mostly at night /Trouble falling and staying asleep /I have quite a bit of fatigue, I'm not sure if its my Thyroid  )   History of Present Illness:  Nicole Brennan is a 83 y.o. very pleasant female patient who presents with the following:  Patient seen today for periodic follow-up Most recent visit with myself was in January Married to [place], they both continue to work. She is a professional pianist    History of hypothyroidism, osteoporosis, endometrial cancer dx 07/2018, prediabetes  She is seen annually by radiation oncology at this time-visit with Dr. Shannon in August of last year-doing well, no clinical evidence of recurrence She also follows up with her gynecologic oncologist, Dr. Dante recent visit in May: Assessment & Plan: Nicole Brennan is a 83 y.o. woman with with stage IB grade 1 endometrial cancer.  MMR intact, MSS. Deep myometrial invasion. S/p adjuvant radiation (vaginal brachytherapy) - completed 10/28/18. No evidence of disease.  Patient continues to do very well, remains asymptomatic. Per NCCN surveillance recommendations, she will complete 5 years of surveillance as of August when she sees Dr. Shannon.  If she remains NED at that time, she can transition to yearly visits with her OB/GYN  Recommend COVID booster Can update her DEXA scan this fall-scan 2023, osteopenia Mammogram just recently completed, normal Most recent labs done in January  Only current medication is levothyroxine  75  She fell on 08/08/23- no other recent falls  Pt notes she missed a step walking into her garage and her hands were  full- she fell onto her left knee and hit her right shoulder on a nearby cabinet She did have her shoulder x-rayed at [facility] ortho- no fracture She notes some aching in her shoulder still at night mostly and it feels a bit stiff.  It is difficult to put on her bra all She is using some motrin  prn/ tylenol  prn It seems to have reached a plateau and is not getting better or worse She has some exercises to do per ortho but admits she is not great about doing them She had a couple of steroids shots which did seem to help some Her knee is not bothering her   She notes she may get tired more easily than in the past- she is not sleeping as well.  She may lie awake or toss and turn She has to get up and pee a few times and does not get back to sleep easily  She goes to bed around 10pm and has to get up at 5:30 am  She may takes a nap some days but often she is working  She also has possible cerumen impaction, her ears feel clogged   Patient Active Problem List   Diagnosis Date Noted   Prediabetes 09/19/2020   Lichen sclerosus 08/03/2019   Endometrial carcinoma (HCC) 08/05/2018   Obesity (BMI 30.0-34.9) 08/05/2018   Seasonal allergies 04/21/2012   Cataract 04/21/2012   Osteoporosis 05/01/2011   Hypothyroidism 04/30/2011   Osteoarthritis 04/30/2011    Past Medical History:  Diagnosis Date  Allergy    Arthritis    Cataract    History of radiation therapy 09/30/2018-10/28/2018   Vaginal cuff HDR; Dr. Lynwood Nasuti   Thyroid  disease     Past Surgical History:  Procedure Laterality Date   DG  BONE DENSITY ([facility] HX)     KNEE SURGERY     ROBOTIC ASSISTED TOTAL HYSTERECTOMY WITH BILATERAL SALPINGO OOPHERECTOMY Bilateral 08/20/2018   Procedure: XI ROBOTIC ASSISTED TOTAL HYSTERECTOMY WITH BILATERAL SALPINGO OOPHORECTOMY;  Surgeon: Eloy Herring, MD;  Location: [facility];  Service: Gynecology;  Laterality: Bilateral;   SENTINEL NODE BIOPSY N/A 08/20/2018   Procedure: SENTINEL LYMPH  NODE  BIOPSY;  Surgeon: Eloy Herring, MD;  Location: [facility];  Service: Gynecology;  Laterality: N/A;   TUBAL LIGATION      Social History   Tobacco Use   Smoking status: Never   Smokeless tobacco: Never  Vaping Use   Vaping status: Never Used  Substance Use Topics   Alcohol use: No   Drug use: No    Family History  Problem Relation Age of Onset   Vascular Disease Mother    Colon cancer Mother    Dementia Father     Allergies  Allergen Reactions   Penicillins Hives    Did it involve swelling of the face/tongue/throat, SOB, or low BP? No Did it involve sudden or severe rash/hives, skin peeling, or any reaction on the inside of your mouth or nose? Unknown Did you need to seek medical attention at a hospital or doctor's office? Yes When did it last happen?Childhood reaction If all above answers are "NO", may proceed with cephalosporin use.     Medication list has been reviewed and updated.  Current Outpatient Medications on File Prior to Visit  Medication Sig Dispense Refill   levothyroxine  (SYNTHROID ) 75 MCG tablet Take 1 tablet (75 mcg total) by mouth daily before breakfast. 90 tablet 1   No current facility-administered medications on file prior to visit.    Review of Systems:  As per HPI- otherwise negative.   Physical Examination: Vitals:   10/13/23 0848  BP: 136/72  Pulse: 65  SpO2: 95%   Vitals:   10/13/23 0848  Weight: 181 lb 6.4 oz (82.3 kg)  Height: 5' 5.5 (1.664 m)   Body mass index is 29.73 kg/m. Ideal Body Weight: Weight in (lb) to have BMI = 25: 152.2  GEN: no acute distress.  Overweight, looks well  HEENT: Atraumatic, Normocephalic.  Cerumen in both ear canals  Ears and Nose: No external deformity. CV: RRR, No M/G/R. No JVD. No thrill. No extra heart sounds. PULM: CTA B, no wheezes, crackles, rhonchi. No retractions. No resp. distress. No accessory muscle use. ABD: S, NT, ND, +BS. No rebound. No HSM. EXTR: No c/c/e PSYCH: Normally  interactive. Conversant.  Right shoulder: Mildly restricted flexion and abduction.  She has some discomfort with internal and external rotation.  Normal strength of biceps and deltoid There is cerumen occluding both ear canals.  Verbal consent obtained, ears are irrigated with warm water  and wax is removed bilaterally  Assessment and Plan: Other specified hypothyroidism - Plan: TSH  Endometrial carcinoma (HCC)  Prediabetes - Plan: Basic metabolic panel with GFR, Hemoglobin A1c  Screening for deficiency anemia - Plan: CBC  Fatigue, unspecified type - Plan: B12, Ferritin  Right rotator cuff tendonitis - Plan: Ambulatory referral to Physical Therapy  Bilateral impacted cerumen  Patient seen today for follow-up.  Will check her thyroid , follow-up on prediabetes Discussed her fatigue.  This is likely due to aging and her fairly rigorous work schedule.  I offered to start her on trazodone for sleep but she declines for now.  She may try some melatonin.  Will check her CBC, B12 and ferritin Referral to PT for rotator cuff tendinitis.  I encouraged her to go for at least a few sessions in person and then she can take over with a home exercise program Cerumen impaction is resolved  Signed Harlene Schroeder, MD  Received labs as below, message to patient  Results for orders placed or performed in visit on 10/13/23  Basic metabolic panel with GFR   Collection Time: 10/13/23 10:01 AM  Result Value Ref Range   Sodium 143 135 - 145 mEq/L   Potassium 4.1 3.5 - 5.1 mEq/L   Chloride 107 96 - 112 mEq/L   CO2 29 19 - 32 mEq/L   Glucose, Bld 91 70 - 99 mg/dL   BUN 12 6 - 23 mg/dL   Creatinine, Ser 9.17 0.40 - 1.20 mg/dL   GFR 33.01 >39.99 mL/min   Calcium 9.5 8.4 - 10.5 mg/dL  CBC   Collection Time: 10/13/23 10:01 AM  Result Value Ref Range   WBC 4.4 4.0 - 10.5 K/uL   RBC 3.70 (L) 3.87 - 5.11 Mil/uL   Platelets 329.0 150.0 - 400.0 K/uL   Hemoglobin 12.0 12.0 - 15.0 g/dL   HCT 63.7 63.9 -  53.9 %   MCV 97.8 78.0 - 100.0 fl   MCHC 33.3 30.0 - 36.0 g/dL   RDW 86.3 88.4 - 84.4 %  Hemoglobin A1c   Collection Time: 10/13/23 10:01 AM  Result Value Ref Range   Hgb A1c MFr Bld 6.1 4.6 - 6.5 %  TSH   Collection Time: 10/13/23 10:01 AM  Result Value Ref Range   TSH 3.35 0.35 - 5.50 uIU/mL  B12   Collection Time: 10/13/23 10:01 AM  Result Value Ref Range   Vitamin B-12 486 211 - 911 pg/mL  Ferritin   Collection Time: 10/13/23 10:01 AM  Result Value Ref Range   Ferritin 245.7 10.0 - 291.0 ng/mL

## 2023-10-13 ENCOUNTER — Encounter: Payer: Self-pay | Admitting: Family Medicine

## 2023-10-13 ENCOUNTER — Ambulatory Visit (INDEPENDENT_AMBULATORY_CARE_PROVIDER_SITE_OTHER): Admitting: Family Medicine

## 2023-10-13 VITALS — BP 136/72 | HR 65 | Ht 65.5 in | Wt 181.4 lb

## 2023-10-13 DIAGNOSIS — Z130 Encounter for screening for diseases of the blood and blood-forming organs and certain disorders involving the immune mechanism: Secondary | ICD-10-CM | POA: Diagnosis not present

## 2023-10-13 DIAGNOSIS — E038 Other specified hypothyroidism: Secondary | ICD-10-CM | POA: Diagnosis not present

## 2023-10-13 DIAGNOSIS — C541 Malignant neoplasm of endometrium: Secondary | ICD-10-CM | POA: Diagnosis not present

## 2023-10-13 DIAGNOSIS — R5383 Other fatigue: Secondary | ICD-10-CM | POA: Diagnosis not present

## 2023-10-13 DIAGNOSIS — R7303 Prediabetes: Secondary | ICD-10-CM | POA: Diagnosis not present

## 2023-10-13 DIAGNOSIS — H6123 Impacted cerumen, bilateral: Secondary | ICD-10-CM

## 2023-10-13 DIAGNOSIS — M7581 Other shoulder lesions, right shoulder: Secondary | ICD-10-CM

## 2023-10-13 LAB — HEMOGLOBIN A1C: Hgb A1c MFr Bld: 6.1 % (ref 4.6–6.5)

## 2023-10-13 LAB — BASIC METABOLIC PANEL WITH GFR
BUN: 12 mg/dL (ref 6–23)
CO2: 29 meq/L (ref 19–32)
Calcium: 9.5 mg/dL (ref 8.4–10.5)
Chloride: 107 meq/L (ref 96–112)
Creatinine, Ser: 0.82 mg/dL (ref 0.40–1.20)
GFR: 66.98 mL/min (ref >60.00)
Glucose, Bld: 91 mg/dL (ref 70–99)
Potassium: 4.1 meq/L (ref 3.5–5.1)
Sodium: 143 meq/L (ref 135–145)

## 2023-10-13 LAB — CBC
HCT: 36.2 % (ref 36.0–46.0)
Hemoglobin: 12 g/dL (ref 12.0–15.0)
MCHC: 33.3 g/dL (ref 30.0–36.0)
MCV: 97.8 fl (ref 78.0–100.0)
Platelets: 329 K/uL (ref 150.0–400.0)
RBC: 3.7 Mil/uL — ABNORMAL LOW (ref 3.87–5.11)
RDW: 13.6 % (ref 11.5–15.5)
WBC: 4.4 K/uL (ref 4.0–10.5)

## 2023-10-13 LAB — TSH: TSH: 3.35 u[IU]/mL (ref 0.35–5.50)

## 2023-10-13 LAB — VITAMIN B12: Vitamin B-12: 486 pg/mL (ref 211–911)

## 2023-10-13 LAB — FERRITIN: Ferritin: 245.7 ng/mL (ref 10.0–291.0)

## 2023-10-15 DIAGNOSIS — H524 Presbyopia: Secondary | ICD-10-CM | POA: Diagnosis not present

## 2023-10-27 DIAGNOSIS — K080 Exfoliation of teeth due to systemic causes: Secondary | ICD-10-CM | POA: Diagnosis not present

## 2023-11-02 NOTE — Progress Notes (Signed)
  Radiation Oncology         [phone] ________________________________  Name: Nicole Brennan MRN: [id number]  Date: 11/03/2023  DOB: [birth year]  Follow-Up Visit Note  CC: Nicole Brennan, Nicole BROCKS, MD  Nicole Brennan, Nicole BROCKS, MD  No diagnosis found.  Diagnosis: FIGO Stage I-B, grade 1 endometrioid adenocarcinoma, pT1b, pN0   Interval Since Last Radiation: 5 years and 6 days   Radiation Treatment Dates: 09/30/2018, 10/07/2018, 10/14/2018, 10/21/2018, 10/28/2018:    Site/Dose: Vaginal Cuff (Brachytherapy) / 30 Gy in 5 fractions  Narrative:  The patient returns today for routine follow-up. She was last seen here for follow up on 11/04/22.       Since her last visit, she followed up with Dr. Viktoria on 08/07/23. She was noted doing well at that time and was appeared to be NED on examination.                            Notable imaging performed in the interval since her last visit includes a bilateral screening mammogram on 09/30/23 which showed no evidence of malignancy in either breast.   No other significant oncologic interval history since the patient was last seen for follow-up.   ***  Allergies:  is allergic to penicillins.  Meds: Current Outpatient Medications  Medication Sig Dispense Refill   levothyroxine  (SYNTHROID ) 75 MCG tablet Take 1 tablet (75 mcg total) by mouth daily before breakfast. 90 tablet 1   No current facility-administered medications for this encounter.    Physical Findings: The patient is in no acute distress. Patient is alert and oriented.  vitals were not taken for this visit. .  No significant changes. Lungs are clear to auscultation bilaterally. Heart has regular rate and rhythm. No palpable cervical, supraclavicular, or axillary adenopathy. Abdomen soft, non-tender, normal bowel sounds.  On pelvic examination the external genitalia were unremarkable. A speculum exam was performed. There are no mucosal lesions noted in the vaginal vault. A Pap smear was  obtained of the proximal vagina. On bimanual and rectovaginal examination there were no pelvic masses appreciated. ***   Lab Findings: Lab Results  Component Value Date   WBC 4.4 10/13/2023   HGB 12.0 10/13/2023   HCT 36.2 10/13/2023   MCV 97.8 10/13/2023   PLT 329.0 10/13/2023    Radiographic Findings: No results found.  Impression:  FIGO Stage I-B, grade 1 endometrioid adenocarcinoma, pT1b, pN0   The patient is recovering from the effects of radiation.  ***  Plan:  ***   *** minutes of total time was spent for this patient encounter, including preparation, face-to-face counseling with the patient and coordination of care, physical exam, and documentation of the encounter. ____________________________________  Lynwood CHARM Nasuti, PhD, MD  This document serves as a record of services personally performed by Lynwood Nasuti, MD. It was created on his behalf by Dorthy Fuse, a trained medical scribe. The creation of this record is based on the scribe's personal observations and the provider's statements to them. This document has been checked and approved by the attending provider.

## 2023-11-02 NOTE — Therapy (Signed)
 OUTPATIENT PHYSICAL THERAPY SHOULDER EVALUATION   Patient Name: Nicole Brennan MRN: [id number] DOB:1940-10-18, 83 y.o., female Today's Date: 11/04/2023   END OF SESSION:  PT End of Session - 11/04/23 0937     Visit Number 1    Date for PT Re-Evaluation 12/30/23    Authorization Type [employer] M[employer] $10 copay    PT Start Time 0935    PT Stop Time 1015    PT Time Calculation (min) 40 min    Activity Tolerance Patient tolerated treatment well;No increased pain    Behavior During Therapy [facility] for tasks assessed/performed          Past Medical History:  Diagnosis Date   Allergy    Arthritis    Cataract    History of radiation therapy 09/30/2018-10/28/2018   Vaginal cuff HDR; Dr. Lynwood Nasuti   Thyroid  disease    Past Surgical History:  Procedure Laterality Date   DG  BONE DENSITY ([facility] HX)     KNEE SURGERY     ROBOTIC ASSISTED TOTAL HYSTERECTOMY WITH BILATERAL SALPINGO OOPHERECTOMY Bilateral 08/20/2018   Procedure: XI ROBOTIC ASSISTED TOTAL HYSTERECTOMY WITH BILATERAL SALPINGO OOPHORECTOMY;  Surgeon: Eloy Herring, MD;  Location: [facility];  Service: Gynecology;  Laterality: Bilateral;   SENTINEL NODE BIOPSY N/A 08/20/2018   Procedure: SENTINEL LYMPH  NODE BIOPSY;  Surgeon: Eloy Herring, MD;  Location: [facility];  Service: Gynecology;  Laterality: N/A;   TUBAL LIGATION     Patient Active Problem List   Diagnosis Date Noted   Prediabetes 09/19/2020   Lichen sclerosus 08/03/2019   Endometrial carcinoma (HCC) 08/05/2018   Obesity (BMI 30.0-34.9) 08/05/2018   Seasonal allergies 04/21/2012   Cataract 04/21/2012   Osteoporosis 05/01/2011   Hypothyroidism 04/30/2011   Osteoarthritis 04/30/2011    PCP: Watt Harlene BROCKS, MD   REFERRING PROVIDER: Watt Harlene BROCKS, MD   REFERRING DIAG: M75.81 (ICD-10-CM) - Right rotator cuff tendonitis  THERAPY DIAG:  Muscle weakness (generalized) - Plan: PT plan of care cert/re-cert  Stiffness of right shoulder, not elsewhere classified - Plan: PT  plan of care cert/re-cert  Abnormal posture - Plan: PT plan of care cert/re-cert  Chronic right shoulder pain - Plan: PT plan of care cert/re-cert  RATIONALE FOR EVALUATION AND TREATMENT: Rehabilitation  ONSET DATE: 5/25  NEXT MD VISIT: unknown   SUBJECTIVE:  SUBJECTIVE STATEMENT: 83 y/o referred to PT from PCP for R RTC tendonitis.  States fell 08/08/23 and hit the R shoulder against a cabinet.   Saw Dr Dalldorf had cortisone injection with some relief about a month ago.  Symptoms are Worst pain is a night, but only lies on her back or L side.  Cannot lie on R side.  Reaching across body, reaching behind back, reaching up/lifting hurts.  Has a lot of difficulty reaching behind her back to fasten bra, dress, etc.    Putting on bed linens is painful with lifting and tucking sheets in.  Hemp lotion helps.   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  Playing piano does not worsen her pain.    She has a h/o R shoulder adhesive capsulitis in 2018 and therapy here with us  with good resolution of symptoms.    PAIN: Are you having pain? Yes: NPRS scale: 1-2/10 best, 6/10 worst Pain location: R shoulder Pain description: always dull achiness, sharper with lifting overhead or reaching behind back Aggravating factors: significant night pain; reaching behind back; reaching overhead Relieving factors: keeping arm down and in front of her  PERTINENT HISTORY:  OA, osteoporosis, prediabetes  PRECAUTIONS: None  RED FLAGS: None  HAND DOMINANCE: Right  WEIGHT BEARING RESTRICTIONS: No  FALLS:  Has patient fallen in last 6 months? Yes. Number of falls 1  LIVING ENVIRONMENT: Lives with: lives with their family and lives with their spouse Lives in:  House/apartment Stairs: No Has following equipment at home: None  OCCUPATION: still working 2 hrs/daily as Dance movement psychotherapist  PLOF: Independent  PATIENT GOALS: not hurt in the R shoulder   OBJECTIVE: (objective measures completed at initial evaluation unless otherwise dated)  DIAGNOSTIC FINDINGS:  None in epic  PATIENT SURVEYS:  Quick Dash= 38.6 / 100 = 38.6 %  COGNITION: Overall cognitive status: Within functional limits for tasks assessed     SENSATION: WFL  POSTURE: rounded shoulders and forward head  UPPER EXTREMITY ROM:   Active ROM Right eval Left eval  Shoulder flexion 160 175  Shoulder extension 30 40  Shoulder abduction 120 175  Shoulder adduction Can touch opposite shoulder Reaches around opposite shoulder  Shoulder internal rotation To hip T4  Shoulder external rotation 50 60  Elbow flexion    Elbow extension    Wrist flexion    Wrist extension    Wrist ulnar deviation    Wrist radial deviation    Wrist pronation    Wrist supination    (Blank rows = not tested)  UPPER EXTREMITY MMT:  MMT Right eval Left eval  Shoulder flexion 3+ 4+  Shoulder extension 4- 4+  Shoulder abduction 3+ 4+  Shoulder adduction    Shoulder internal rotation 4 5  Shoulder external rotation 4- 5  Middle trapezius    Lower trapezius    Elbow flexion    Elbow extension    Wrist flexion    Wrist extension    Wrist ulnar deviation    Wrist radial deviation    Wrist pronation    Wrist supination    Grip strength (lbs) = B   (Blank rows = not tested)  SHOULDER SPECIAL TESTS: Impingement tests: Neer impingement test: positive , Hawkins/Kennedy impingement test: positive , and Painful arc test: positive  SLAP lesions: NT Instability tests: Apprehension test: negative Rotator cuff assessment: Drop arm test: positive  and Gerber lift off test: positive  Biceps assessment:  Yergason's test: negative and Speed's test: positive   JOINT MOBILITY TESTING:  Decreased jt accessory  motions in all directions  PALPATION:  Very TTP over the anterior and lateral shoulder at greater tuberosity to the coracoid    TODAY'S TREATMENT:  11/04/23 SELF CARE: Provided education on PT POC progression and on pain management options.   PATIENT EDUCATION:  Education details: PT eval findings, anticipated POC, and initial HEP  Person educated: Patient Education method: Explanation, Demonstration, Verbal cues, Tactile cues, and Handouts Education comprehension: verbalized understanding, verbal cues required, tactile cues required, and needs further education  HOME EXERCISE PROGRAM: Access Code: [email or web address] URL: [email or web address] Date: 11/04/2023 Prepared by: Garnette Montclair  Exercises - Supine Shoulder Flexion Extension AAROM with Dowel  - 1 x daily - 7 x weekly - 3 sets - 10 reps - Sidelying Shoulder External Rotation  - 1 x daily - 7 x weekly - 3 sets - 10 reps - Sidelying Shoulder Abduction Palm Forward  - 1 x daily - 7 x weekly - 3 sets - 10 reps - Sidelying Shoulder Horizontal Abduction  - 1 x daily - 7 x weekly - 3 sets - 10 reps - Shoulder Flexion Wall Slide with Towel  - 1 x daily - 7 x weekly - 3 sets - 10 reps   ASSESSMENT:  CLINICAL IMPRESSION: Nicole Brennan is a 83 y.o. female who was referred to physical therapy for evaluation and treatment for R RTC tendonitis.   Patient reports onset of R shoulder pain beginning in May this year. Pain is worst at night even though she doesn't lie on the R side.  Pain increases with lifting the R arm, doing bed sheets, reaching behind back to fasten bra.  Resting pain is minimal and she does well with activities in neutral nonelevated shoulder positions such as playing the piano.  She does have a mild to moderately positive drop arm test, lift off test, and impingement tests.  Her neck is also very stiff.   Patient has deficits in R shoulder ROM, R shoulder and neck flexibility, R shoulder strength, forward  head/rounded shoulder abnormal posture, and TTP anterolateral R shoulder which are interfering with ADLs and are impacting quality of life.  On QuickDASH patient scored 38.6 / 100 = 38.6 % disability.  Nicole Brennan will benefit from skilled PT to address above deficits to improve mobility and activity tolerance with decreased pain interference.  Of note, she only wants 1-2 PT visits total, so we will educate her in a home program.  However, she is advised that 2 visits are not likely to give her the optimal benefit from therapy and that we would recommend at least 1-2 times weekly for 1 month.      OBJECTIVE IMPAIRMENTS: decreased ROM, decreased strength, impaired flexibility, impaired UE functional use, and pain.   ACTIVITY LIMITATIONS: carrying, lifting, dressing, reach over head, and hygiene/grooming  PARTICIPATION LIMITATIONS: cleaning, laundry, community activity, and yard work  PERSONAL FACTORS: Age, Fitness, Time since onset of injury/illness/exacerbation, and 1-2 comorbidities: OA, osteoporosis, h/o frozen R shoulder are also affecting patient's functional outcome.   REHAB POTENTIAL: Good  CLINICAL DECISION MAKING: Evolving/moderate complexity  EVALUATION COMPLEXITY: Moderate   GOALS: Goals reviewed with patient? Yes  SHORT TERM GOALS: Target date: 12/02/2023   Patient will be independent with initial HEP to improve outcomes and carryover.  Baseline: 100% PT assist required for correct completion Goal status: INITIAL  2.  Patient will report 25% improvement in R shoulder  pain to improve QOL.   Baseline: 6/10 worst Goal status: INITIAL  LONG TERM GOALS: Target date: 12/30/2023   Patient will be independent with ongoing/advanced HEP for self-management at home.  Baseline: no advanced HEP yet Goal status: INITIAL  2.  Patient will report 50-75% improvement in R shoulder pain to improve QOL.  Baseline: 6/10 worst Goal status: INITIAL  3.  Patient to demonstrate improved  upright posture with posterior shoulder girdle engaged to promote improved glenohumeral joint mobility. Baseline: forward head, rounded shoulder, difficulty with extension Goal status: INITIAL  4.  Patient to improve R shoulder AROM to WNL without pain provocation to allow for increased ease of ADLs.  Baseline: Refer to above UE ROM table Goal status: INITIAL  5.  Patient will demonstrate improved R shoulder strength to >/= 5/5 for functional UE use. Baseline: Refer to above UE MMT table Goal status: INITIAL  6  Patient will report </= 20 % on QuickDASH (MCID = 14%) to demonstrate improved functional ability.  Baseline: 38.6 / 100 = 38.6 % Goal status: INITIAL  PLAN:  PT FREQUENCY: 1-2x/week  PT DURATION: 8 weeks  PLANNED INTERVENTIONS: 97164- PT Re-evaluation, 97750- Physical Performance Testing, 97110-Therapeutic exercises, 97530- Therapeutic activity, [id number]- Neuromuscular re-education, 97535- Self Care, 02859- Manual therapy, [id number]- Electrical stimulation (unattended), 97016- Vasopneumatic device, [id number]- Ultrasound, [id number]- Traction (mechanical), [id number]- Ionotophoresis 4mg /ml Dexamethasone , 79439 (1-2 muscles), 20561 (3+ muscles)- Dry Needling, Patient/Family education, Balance training, Taping, Joint mobilization, Spinal mobilization, Cryotherapy, and Moist heat  PLAN FOR NEXT SESSION:    Progress with gentle RTC strengthening, ROM with scapular clocks, stretching for IR and ER.   See if patient wants to be D/C or schedule more visits.   We may leave her chart open x 30 days if she doesn't want to schedule any further visits at this time.   Nicole Brennan, PT 11/04/2023, 1:12 PM

## 2023-11-03 ENCOUNTER — Telehealth: Payer: Self-pay | Admitting: *Deleted

## 2023-11-03 ENCOUNTER — Encounter: Payer: Self-pay | Admitting: Radiation Oncology

## 2023-11-03 ENCOUNTER — Ambulatory Visit
Admission: RE | Admit: 2023-11-03 | Discharge: 2023-11-03 | Disposition: A | Discharge status: Home / Self Care | Payer: Self-pay | Source: Ambulatory Visit | Attending: Radiation Oncology | Admitting: Radiation Oncology

## 2023-11-03 VITALS — BP 159/69 | HR 77 | Temp 97.9°F | Resp 20 | Ht 65.5 in | Wt 182.6 lb

## 2023-11-03 DIAGNOSIS — C541 Malignant neoplasm of endometrium: Secondary | ICD-10-CM

## 2023-11-03 DIAGNOSIS — R351 Nocturia: Secondary | ICD-10-CM | POA: Diagnosis not present

## 2023-11-03 DIAGNOSIS — Z923 Personal history of irradiation: Secondary | ICD-10-CM | POA: Insufficient documentation

## 2023-11-03 DIAGNOSIS — Z79899 Other long term (current) drug therapy: Secondary | ICD-10-CM | POA: Diagnosis not present

## 2023-11-03 DIAGNOSIS — Z8542 Personal history of malignant neoplasm of other parts of uterus: Secondary | ICD-10-CM | POA: Insufficient documentation

## 2023-11-03 NOTE — Progress Notes (Signed)
 Nicole Brennan is here today for follow up post radiation to the pelvic.  They completed their radiation on: 10/28/18  Does the patient complain of any of the following:  Pain:No Abdominal bloating: No Diarrhea/Constipation: No Nausea/Vomiting: No Vaginal Discharge: No Blood in Urine or Stool: No Urinary Issues (dysuria/incomplete emptying/ incontinence/ increased frequency/urgency): Reports nocturia getting up 2-3 times at night.  Does patient report using vaginal dilator 2-3 times a week and/or sexually active 2-3 weeks: No longer using  Post radiation skin changes: No   Additional comments if applicable:  BP (!) 159/69 (BP Location: Right Arm, Patient Position: Sitting, Cuff Size: Large)   Pulse 77   Temp 97.9 F (36.6 C)   Resp 20   Ht 5' 5.5 (1.664 m)   Wt 182 lb 9.6 oz (82.8 kg)   SpO2 100%   BMI 29.92 kg/m

## 2023-11-03 NOTE — Telephone Encounter (Signed)
 Attempted to reach patient to relay appt. That has been scheduled for 1 year follow up with Eleanor Epps, NP 11/02/2024 at 1 pm. Left voicemail requesting call back.

## 2023-11-04 ENCOUNTER — Encounter: Payer: Self-pay | Admitting: Rehabilitation

## 2023-11-04 ENCOUNTER — Ambulatory Visit: Attending: Family Medicine | Admitting: Rehabilitation

## 2023-11-04 DIAGNOSIS — M25611 Stiffness of right shoulder, not elsewhere classified: Secondary | ICD-10-CM | POA: Diagnosis not present

## 2023-11-04 DIAGNOSIS — R293 Abnormal posture: Secondary | ICD-10-CM | POA: Insufficient documentation

## 2023-11-04 DIAGNOSIS — G8929 Other chronic pain: Secondary | ICD-10-CM | POA: Insufficient documentation

## 2023-11-04 DIAGNOSIS — M7581 Other shoulder lesions, right shoulder: Secondary | ICD-10-CM | POA: Insufficient documentation

## 2023-11-04 DIAGNOSIS — M25511 Pain in right shoulder: Secondary | ICD-10-CM | POA: Diagnosis not present

## 2023-11-04 DIAGNOSIS — M6281 Muscle weakness (generalized): Secondary | ICD-10-CM | POA: Diagnosis not present

## 2023-11-04 NOTE — Telephone Encounter (Signed)
 Patient returned call and is aware of her scheduled follow up with Eleanor Epps, NP for next year on 11/02/2024.

## 2023-11-04 NOTE — Telephone Encounter (Signed)
 2nd attempt to reach patient to let her know of her follow up with Eleanor Epps, NP on Tuesady, November 02, 2024.

## 2023-11-12 ENCOUNTER — Ambulatory Visit: Admitting: Rehabilitation

## 2023-12-01 ENCOUNTER — Other Ambulatory Visit (HOSPITAL_BASED_OUTPATIENT_CLINIC_OR_DEPARTMENT_OTHER): Payer: Self-pay

## 2023-12-01 ENCOUNTER — Other Ambulatory Visit: Payer: Self-pay | Admitting: Family Medicine

## 2023-12-01 DIAGNOSIS — E038 Other specified hypothyroidism: Secondary | ICD-10-CM

## 2023-12-01 MED ORDER — LEVOTHYROXINE SODIUM 75 MCG PO TABS
75.0000 ug | ORAL_TABLET | Freq: Every day | ORAL | 1 refills | Status: AC
  Filled 2023-12-01: qty 90, 90d supply, fill #0
  Filled 2024-02-16: qty 90, 90d supply, fill #1

## 2023-12-02 ENCOUNTER — Ambulatory Visit: Attending: Family Medicine | Admitting: Rehabilitation

## 2023-12-02 ENCOUNTER — Telehealth: Payer: Self-pay | Admitting: Family Medicine

## 2023-12-02 DIAGNOSIS — M6281 Muscle weakness (generalized): Secondary | ICD-10-CM | POA: Insufficient documentation

## 2023-12-02 DIAGNOSIS — M25511 Pain in right shoulder: Secondary | ICD-10-CM | POA: Insufficient documentation

## 2023-12-02 DIAGNOSIS — M25611 Stiffness of right shoulder, not elsewhere classified: Secondary | ICD-10-CM | POA: Insufficient documentation

## 2023-12-02 DIAGNOSIS — G8929 Other chronic pain: Secondary | ICD-10-CM | POA: Insufficient documentation

## 2023-12-02 DIAGNOSIS — R293 Abnormal posture: Secondary | ICD-10-CM | POA: Insufficient documentation

## 2023-12-02 NOTE — Telephone Encounter (Signed)
Cancel this message  ° °

## 2023-12-02 NOTE — Therapy (Addendum)
 " OUTPATIENT PHYSICAL THERAPY SHOULDER VISIT/ DC SUMMARY   Patient Name: Nicole Brennan MRN: [id number] DOB:[birth year], 83 y.o., female Today's Date: 12/02/2023   END OF SESSION:  PT End of Session - 12/02/23 1141     Visit Number 2    PT Start Time 1144    PT Stop Time 1212    PT Time Calculation (min) 28 min          Past Medical History:  Diagnosis Date   Allergy    Arthritis    Cataract    History of radiation therapy 09/30/2018-10/28/2018   Vaginal cuff HDR; Dr. Lynwood Nasuti   Thyroid  disease    Past Surgical History:  Procedure Laterality Date   DG  BONE DENSITY ([facility] HX)     KNEE SURGERY     ROBOTIC ASSISTED TOTAL HYSTERECTOMY WITH BILATERAL SALPINGO OOPHERECTOMY Bilateral 08/20/2018   Procedure: XI ROBOTIC ASSISTED TOTAL HYSTERECTOMY WITH BILATERAL SALPINGO OOPHORECTOMY;  Surgeon: Eloy Herring, MD;  Location: [facility];  Service: Gynecology;  Laterality: Bilateral;   SENTINEL NODE BIOPSY N/A 08/20/2018   Procedure: SENTINEL LYMPH  NODE BIOPSY;  Surgeon: Eloy Herring, MD;  Location: [facility];  Service: Gynecology;  Laterality: N/A;   TUBAL LIGATION     Patient Active Problem List   Diagnosis Date Noted   Prediabetes 09/19/2020   Lichen sclerosus 08/03/2019   Endometrial carcinoma (HCC) 08/05/2018   Obesity (BMI 30.0-34.9) 08/05/2018   Seasonal allergies 04/21/2012   Cataract 04/21/2012   Osteoporosis 05/01/2011   Hypothyroidism 04/30/2011   Osteoarthritis 04/30/2011    PCP: Watt Harlene BROCKS, MD   REFERRING PROVIDER: Watt Harlene BROCKS, MD   REFERRING DIAG: M75.81 (ICD-10-CM) - Right rotator cuff tendonitis  THERAPY DIAG:  Muscle weakness (generalized)  Stiffness of right shoulder, not elsewhere classified  Abnormal posture  Chronic right shoulder pain  RATIONALE FOR EVALUATION AND TREATMENT: Rehabilitation  ONSET DATE: 5/25  NEXT MD VISIT: unknown   SUBJECTIVE:                                                                                                                                                                                                          SUBJECTIVE STATEMENT: Patient reports she is doing much better.   No c/o pain except with cross body reaching.  States wants to make today's visit her last for now, but wishes for the chart to be left open for another month in case she needs to return.    EVAL:82 y/o referred to PT from PCP for R RTC tendonitis.  States fell 08/08/23 and hit the R shoulder against a cabinet.   Saw Dr Dalldorf had cortisone injection with some relief about a month ago.  Symptoms are Worst pain is a night, but only lies on her back or L side.  Cannot lie on R side.  Reaching across body, reaching behind back, reaching up/lifting hurts.  Has a lot of difficulty reaching behind her back to fasten bra, dress, etc.    Putting on bed linens is painful with lifting and tucking sheets in.  Hemp lotion helps.   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  Playing piano does not worsen her pain.    She has a h/o R shoulder adhesive capsulitis in 2018 and therapy here with us  with good resolution of symptoms.    PAIN: Are you having pain? Yes: NPRS scale: 1-2/10 best, 6/10 worst Pain location: R shoulder Pain description: always dull achiness, sharper with lifting overhead or reaching behind back Aggravating factors: significant night pain; reaching behind back; reaching overhead Relieving factors: keeping arm down and in front of her  PERTINENT HISTORY:  OA, osteoporosis, prediabetes  PRECAUTIONS: None  RED FLAGS: None  HAND DOMINANCE: Right  WEIGHT BEARING RESTRICTIONS: No  FALLS:  Has patient fallen in last 6 months? Yes. Number of falls 1  LIVING ENVIRONMENT: Lives with: lives with their family and lives with their spouse Lives in: House/apartment Stairs: No Has following equipment at home: None  OCCUPATION: still working 2 hrs/daily as  dance movement psychotherapist  PLOF: Independent  PATIENT GOALS: not hurt in the R shoulder   OBJECTIVE: (objective measures completed at initial evaluation unless otherwise dated)  DIAGNOSTIC FINDINGS:  None in epic  PATIENT SURVEYS:  Quick Dash= 38.6 / 100 = 38.6 %  COGNITION: Overall cognitive status: Within functional limits for tasks assessed     SENSATION: WFL  POSTURE: rounded shoulders and forward head  UPPER EXTREMITY ROM:   Active ROM Right eval Left eval  Shoulder flexion 160 175  Shoulder extension 30 40  Shoulder abduction 120 175  Shoulder adduction Can touch opposite shoulder Reaches around opposite shoulder  Shoulder internal rotation To hip T4  Shoulder external rotation 50 60  Elbow flexion    Elbow extension    Wrist flexion    Wrist extension    Wrist ulnar deviation    Wrist radial deviation    Wrist pronation    Wrist supination    (Blank rows = not tested)  UPPER EXTREMITY MMT:  MMT Right eval Left eval  Shoulder flexion 3+ 4+  Shoulder extension 4- 4+  Shoulder abduction 3+ 4+  Shoulder adduction    Shoulder internal rotation 4 5  Shoulder external rotation 4- 5  Middle trapezius    Lower trapezius    Elbow flexion    Elbow extension    Wrist flexion    Wrist extension    Wrist ulnar deviation    Wrist radial deviation    Wrist pronation    Wrist supination    Grip strength (lbs) = B   (Blank rows = not tested)  SHOULDER SPECIAL TESTS: Impingement tests: Neer impingement test: positive , Hawkins/Kennedy impingement test: positive , and Painful arc test: positive  SLAP lesions: NT Instability tests: Apprehension test: negative Rotator cuff assessment: Drop arm test: positive  and Gerber lift off test: positive  Biceps assessment: Yergason's test: negative and Speed's test: positive   JOINT MOBILITY TESTING:  Decreased  jt accessory motions in all directions  PALPATION:  Very TTP over the anterior and lateral shoulder at greater tuberosity to  the coracoid    TODAY'S TREATMENT:  12/02/23 THERAPEUTIC EXERCISE: To improve strength.  Demonstration, verbal and tactile cues throughout for technique. UBE L0 x 5' backward  THERAPEUTIC ACTIVITIES: To improve functional performance.  Demonstration, verbal and tactile cues throughout for technique. Supine cane press up x 15    11/04/23 SELF CARE: Provided education on PT POC progression and on pain management options.   PATIENT EDUCATION:  Education details: HEP review and HEP update  Person educated: Patient Education method: Explanation, Demonstration, Verbal cues, Tactile cues, and Handouts Education comprehension: verbalized understanding, verbal cues required, tactile cues required, and needs further education  HOME EXERCISE PROGRAM: Access Code: [email or web address] URL: [email or web address] Date: 11/04/2023 Prepared by: Garnette Montclair  Exercises - Supine Shoulder Flexion Extension AAROM with Dowel  - 1 x daily - 7 x weekly - 3 sets - 10 reps - Sidelying Shoulder External Rotation  - 1 x daily - 7 x weekly - 3 sets - 10 reps - Sidelying Shoulder Abduction Palm Forward  - 1 x daily - 7 x weekly - 3 sets - 10 reps - Sidelying Shoulder Horizontal Abduction  - 1 x daily - 7 x weekly - 3 sets - 10 reps - Shoulder Flexion Wall Slide with Towel  - 1 x daily - 7 x weekly - 3 sets - 10 reps   ASSESSMENT:  CLINICAL IMPRESSION: Patient is doing well per her report.   She would like to do a 30 day hold to see if she needs to return to therapy, but otherwise will continue with her home exercise program on her own.   Her HEP is updated today and she is able to complete all exercises without increased pain, but does get shoulder fatigue towards the end of the session.  She still does have deficits that we could address such as weakness but she is satisfied with her progress and will only return PRN in the next 1 month.   WE will D/C after that time if we do not hear from her.    Hopefully she will make a full recovery.       EVAL: DASHANTI BURR is a 83 y.o. female who was referred to physical therapy for evaluation and treatment for R RTC tendonitis.   Patient reports onset of R shoulder pain beginning in May this year. Pain is worst at night even though she doesn't lie on the R side.  Pain increases with lifting the R arm, doing bed sheets, reaching behind back to fasten bra.  Resting pain is minimal and she does well with activities in neutral nonelevated shoulder positions such as playing the piano.  She does have a mild to moderately positive drop arm test, lift off test, and impingement tests.  Her neck is also very stiff.   Patient has deficits in R shoulder ROM, R shoulder and neck flexibility, R shoulder strength, forward head/rounded shoulder abnormal posture, and TTP anterolateral R shoulder which are interfering with ADLs and are impacting quality of life.  On QuickDASH patient scored 38.6 / 100 = 38.6 % disability.  Yamna Mackel will benefit from skilled PT to address above deficits to improve mobility and activity tolerance with decreased pain interference.  Of note, she only wants 1-2 PT visits total, so we will educate her in a home program.  However, she is advised that 2 visits  are not likely to give her the optimal benefit from therapy and that we would recommend at least 1-2 times weekly for 1 month.      OBJECTIVE IMPAIRMENTS: decreased ROM, decreased strength, impaired flexibility, impaired UE functional use, and pain.   ACTIVITY LIMITATIONS: carrying, lifting, dressing, reach over head, and hygiene/grooming  PARTICIPATION LIMITATIONS: cleaning, laundry, community activity, and yard work  PERSONAL FACTORS: Age, Fitness, Time since onset of injury/illness/exacerbation, and 1-2 comorbidities: OA, osteoporosis, h/o frozen R shoulder are also affecting patient's functional outcome.   REHAB POTENTIAL: Good  CLINICAL DECISION MAKING: Evolving/moderate  complexity  EVALUATION COMPLEXITY: Moderate   GOALS: Goals reviewed with patient? Yes  SHORT TERM GOALS: Target date: 12/02/2023   Patient will be independent with initial HEP to improve outcomes and carryover.  Baseline: 100% PT assist required for correct completion Goal status: MET  2.  Patient will report 25% improvement in R shoulder pain to improve QOL.   Baseline: 6/10 worst Goal status: MET  LONG TERM GOALS: Target date: 12/30/2023   Patient will be independent with ongoing/advanced HEP for self-management at home.  Baseline: no advanced HEP yet Goal status: INITIAL  2.  Patient will report 50-75% improvement in R shoulder pain to improve QOL.  Baseline: 6/10 worst Goal status: INITIAL  3.  Patient to demonstrate improved upright posture with posterior shoulder girdle engaged to promote improved glenohumeral joint mobility. Baseline: forward head, rounded shoulder, difficulty with extension Goal status: INITIAL  4.  Patient to improve R shoulder AROM to WNL without pain provocation to allow for increased ease of ADLs.  Baseline: Refer to above UE ROM table Goal status: INITIAL  5.  Patient will demonstrate improved R shoulder strength to >/= 5/5 for functional UE use. Baseline: Refer to above UE MMT table Goal status: INITIAL  6  Patient will report </= 20 % on QuickDASH (MCID = 14%) to demonstrate improved functional ability.  Baseline: 38.6 / 100 = 38.6 % Goal status: INITIAL  PLAN:  PT FREQUENCY: 1-2x/week  PT DURATION: 8 weeks  PLANNED INTERVENTIONS: 97164- PT Re-evaluation, 97750- Physical Performance Testing, 97110-Therapeutic exercises, 97530- Therapeutic activity, [id number]- Neuromuscular re-education, 97535- Self Care, [id number]- Manual therapy, [id number]- Electrical stimulation (unattended), 97016- Vasopneumatic device, [id number]- Ultrasound, [id number]- Traction (mechanical), [id number]- Ionotophoresis 4mg /ml Dexamethasone , 79439 (1-2 muscles), 20561 (3+ muscles)- Dry  Needling, Patient/Family education, Balance training, Taping, Joint mobilization, Spinal mobilization, Cryotherapy, and Moist heat  PLAN FOR NEXT SESSION:    Will hold further therapy per patient request.   She will have the option to call back an make appointment PRN for another 1 month.   She verbalizes understanding and agreement.   D/C PT  PHYSICAL THERAPY DISCHARGE SUMMARY  Visits from Start of Care: 2  Current functional level related to goals / functional outcomes: Patient came in for 2 PT visits for RTC tendonitis.   She did not want to make any further appointments although I think she would have benefited from more therapy.   She was advised to call back PRN and we would be happy to see her   Remaining deficits: Still has shoulder weakness with deficits in functional strength, postural stability.   Would benefit from more PT, but only wanted 2 visits   Education / Equipment: Has an HEP to do at home.   Hopefully she is continuing to improve   Patient agrees to discharge. Patient goals were partially met. Patient is being discharged due to being pleased with the current functional level.  Dolly Harbach, PT 12/02/2023, 12:44 PM  "

## 2023-12-02 NOTE — Therapy (Incomplete Revision)
 OUTPATIENT PHYSICAL THERAPY SHOULDER VISIT/ DC SUMMARY   Patient Name: Nicole Brennan MRN: [id number] DOB:1940-09-30, 83 y.o., female Today's Date: 12/02/2023   END OF SESSION:  PT End of Session - 12/02/23 1141     Visit Number 2    PT Start Time 1144    PT Stop Time 1212    PT Time Calculation (min) 28 min          Past Medical History:  Diagnosis Date   Allergy    Arthritis    Cataract    History of radiation therapy 09/30/2018-10/28/2018   Vaginal cuff HDR; Dr. Lynwood Nasuti   Thyroid  disease    Past Surgical History:  Procedure Laterality Date   DG  BONE DENSITY ([facility] HX)     KNEE SURGERY     ROBOTIC ASSISTED TOTAL HYSTERECTOMY WITH BILATERAL SALPINGO OOPHERECTOMY Bilateral 08/20/2018   Procedure: XI ROBOTIC ASSISTED TOTAL HYSTERECTOMY WITH BILATERAL SALPINGO OOPHORECTOMY;  Surgeon: Eloy Herring, MD;  Location: [facility];  Service: Gynecology;  Laterality: Bilateral;   SENTINEL NODE BIOPSY N/A 08/20/2018   Procedure: SENTINEL LYMPH  NODE BIOPSY;  Surgeon: Eloy Herring, MD;  Location: [facility];  Service: Gynecology;  Laterality: N/A;   TUBAL LIGATION     Patient Active Problem List   Diagnosis Date Noted   Prediabetes 09/19/2020   Lichen sclerosus 08/03/2019   Endometrial carcinoma (HCC) 08/05/2018   Obesity (BMI 30.0-34.9) 08/05/2018   Seasonal allergies 04/21/2012   Cataract 04/21/2012   Osteoporosis 05/01/2011   Hypothyroidism 04/30/2011   Osteoarthritis 04/30/2011    PCP: Watt Harlene BROCKS, MD   REFERRING PROVIDER: Watt Harlene BROCKS, MD   REFERRING DIAG: M75.81 (ICD-10-CM) - Right rotator cuff tendonitis  THERAPY DIAG:  Muscle weakness (generalized)  Stiffness of right shoulder, not elsewhere classified  Abnormal posture  Chronic right shoulder pain  RATIONALE FOR EVALUATION AND TREATMENT: Rehabilitation  ONSET DATE: 5/25  NEXT MD VISIT: unknown   SUBJECTIVE:                                                                                                                                                                                                          SUBJECTIVE STATEMENT: Patient reports she is doing much better.   No c/o pain except with cross body reaching.  States wants to make today's visit her last for now, but wishes for the chart to be left open for another month in case she needs to return.    EVAL:82 y/o referred to PT from PCP for R RTC tendonitis.  States  fell 08/08/23 and hit the R shoulder against a cabinet.   Saw Dr Dalldorf had cortisone injection with some relief about a month ago.  Symptoms are Worst pain is a night, but only lies on her back or L side.  Cannot lie on R side.  Reaching across body, reaching behind back, reaching up/lifting hurts.  Has a lot of difficulty reaching behind her back to fasten bra, dress, etc.    Putting on bed linens is painful with lifting and tucking sheets in.  Hemp lotion helps.   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  Playing piano does not worsen her pain.    She has a h/o R shoulder adhesive capsulitis in 2018 and therapy here with us  with good resolution of symptoms.    PAIN: Are you having pain? Yes: NPRS scale: 1-2/10 best, 6/10 worst Pain location: R shoulder Pain description: always dull achiness, sharper with lifting overhead or reaching behind back Aggravating factors: significant night pain; reaching behind back; reaching overhead Relieving factors: keeping arm down and in front of her  PERTINENT HISTORY:  OA, osteoporosis, prediabetes  PRECAUTIONS: None  RED FLAGS: None  HAND DOMINANCE: Right  WEIGHT BEARING RESTRICTIONS: No  FALLS:  Has patient fallen in last 6 months? Yes. Number of falls 1  LIVING ENVIRONMENT: Lives with: lives with their family and lives with their spouse Lives in: House/apartment Stairs: No Has following equipment at home: None  OCCUPATION: still working 2 hrs/daily as  dance movement psychotherapist  PLOF: Independent  PATIENT GOALS: not hurt in the R shoulder   OBJECTIVE: (objective measures completed at initial evaluation unless otherwise dated)  DIAGNOSTIC FINDINGS:  None in epic  PATIENT SURVEYS:  Quick Dash= 38.6 / 100 = 38.6 %  COGNITION: Overall cognitive status: Within functional limits for tasks assessed     SENSATION: WFL  POSTURE: rounded shoulders and forward head  UPPER EXTREMITY ROM:   Active ROM Right eval Left eval  Shoulder flexion 160 175  Shoulder extension 30 40  Shoulder abduction 120 175  Shoulder adduction Can touch opposite shoulder Reaches around opposite shoulder  Shoulder internal rotation To hip T4  Shoulder external rotation 50 60  Elbow flexion    Elbow extension    Wrist flexion    Wrist extension    Wrist ulnar deviation    Wrist radial deviation    Wrist pronation    Wrist supination    (Blank rows = not tested)  UPPER EXTREMITY MMT:  MMT Right eval Left eval  Shoulder flexion 3+ 4+  Shoulder extension 4- 4+  Shoulder abduction 3+ 4+  Shoulder adduction    Shoulder internal rotation 4 5  Shoulder external rotation 4- 5  Middle trapezius    Lower trapezius    Elbow flexion    Elbow extension    Wrist flexion    Wrist extension    Wrist ulnar deviation    Wrist radial deviation    Wrist pronation    Wrist supination    Grip strength (lbs) = B   (Blank rows = not tested)  SHOULDER SPECIAL TESTS: Impingement tests: Neer impingement test: positive , Hawkins/Kennedy impingement test: positive , and Painful arc test: positive  SLAP lesions: NT Instability tests: Apprehension test: negative Rotator cuff assessment: Drop arm test: positive  and Gerber lift off test: positive  Biceps assessment: Yergason's test: negative and Speed's test: positive   JOINT MOBILITY TESTING:  Decreased jt  accessory motions in all directions  PALPATION:  Very TTP over the anterior and lateral shoulder at greater tuberosity to  the coracoid    TODAY'S TREATMENT:  12/02/23 THERAPEUTIC EXERCISE: To improve strength.  Demonstration, verbal and tactile cues throughout for technique. UBE L0 x 5' backward  THERAPEUTIC ACTIVITIES: To improve functional performance.  Demonstration, verbal and tactile cues throughout for technique. Supine cane press up x 15    11/04/23 SELF CARE: Provided education on PT POC progression and on pain management options.   PATIENT EDUCATION:  Education details: HEP review and HEP update  Person educated: Patient Education method: Explanation, Demonstration, Verbal cues, Tactile cues, and Handouts Education comprehension: verbalized understanding, verbal cues required, tactile cues required, and needs further education  HOME EXERCISE PROGRAM: Access Code: [email or web address] URL: [email or web address] Date: 11/04/2023 Prepared by: Garnette Montclair  Exercises - Supine Shoulder Flexion Extension AAROM with Dowel  - 1 x daily - 7 x weekly - 3 sets - 10 reps - Sidelying Shoulder External Rotation  - 1 x daily - 7 x weekly - 3 sets - 10 reps - Sidelying Shoulder Abduction Palm Forward  - 1 x daily - 7 x weekly - 3 sets - 10 reps - Sidelying Shoulder Horizontal Abduction  - 1 x daily - 7 x weekly - 3 sets - 10 reps - Shoulder Flexion Wall Slide with Towel  - 1 x daily - 7 x weekly - 3 sets - 10 reps   ASSESSMENT:  CLINICAL IMPRESSION: Patient is doing well per her report.   She would like to do a 30 day hold to see if she needs to return to therapy, but otherwise will continue with her home exercise program on her own.   Her HEP is updated today and she is able to complete all exercises without increased pain, but does get shoulder fatigue towards the end of the session.  She still does have deficits that we could address such as weakness but she is satisfied with her progress and will only return PRN in the next 1 month.   WE will D/C after that time if we do not hear from her.    Hopefully she will make a full recovery.       EVAL: Nicole Brennan is a 83 y.o. female who was referred to physical therapy for evaluation and treatment for R RTC tendonitis.   Patient reports onset of R shoulder pain beginning in May this year. Pain is worst at night even though she doesn't lie on the R side.  Pain increases with lifting the R arm, doing bed sheets, reaching behind back to fasten bra.  Resting pain is minimal and she does well with activities in neutral nonelevated shoulder positions such as playing the piano.  She does have a mild to moderately positive drop arm test, lift off test, and impingement tests.  Her neck is also very stiff.   Patient has deficits in R shoulder ROM, R shoulder and neck flexibility, R shoulder strength, forward head/rounded shoulder abnormal posture, and TTP anterolateral R shoulder which are interfering with ADLs and are impacting quality of life.  On QuickDASH patient scored 38.6 / 100 = 38.6 % disability.  Mahiya Kercheval will benefit from skilled PT to address above deficits to improve mobility and activity tolerance with decreased pain interference.  Of note, she only wants 1-2 PT visits total, so we will educate her in a home program.  However, she is advised that 2 visits are  not likely to give her the optimal benefit from therapy and that we would recommend at least 1-2 times weekly for 1 month.      OBJECTIVE IMPAIRMENTS: decreased ROM, decreased strength, impaired flexibility, impaired UE functional use, and pain.   ACTIVITY LIMITATIONS: carrying, lifting, dressing, reach over head, and hygiene/grooming  PARTICIPATION LIMITATIONS: cleaning, laundry, community activity, and yard work  PERSONAL FACTORS: Age, Fitness, Time since onset of injury/illness/exacerbation, and 1-2 comorbidities: OA, osteoporosis, h/o frozen R shoulder are also affecting patient's functional outcome.   REHAB POTENTIAL: Good  CLINICAL DECISION MAKING: Evolving/moderate  complexity  EVALUATION COMPLEXITY: Moderate   GOALS: Goals reviewed with patient? Yes  SHORT TERM GOALS: Target date: 12/02/2023   Patient will be independent with initial HEP to improve outcomes and carryover.  Baseline: 100% PT assist required for correct completion Goal status: MET  2.  Patient will report 25% improvement in R shoulder pain to improve QOL.   Baseline: 6/10 worst Goal status: MET  LONG TERM GOALS: Target date: 12/30/2023   Patient will be independent with ongoing/advanced HEP for self-management at home.  Baseline: no advanced HEP yet Goal status: INITIAL  2.  Patient will report 50-75% improvement in R shoulder pain to improve QOL.  Baseline: 6/10 worst Goal status: INITIAL  3.  Patient to demonstrate improved upright posture with posterior shoulder girdle engaged to promote improved glenohumeral joint mobility. Baseline: forward head, rounded shoulder, difficulty with extension Goal status: INITIAL  4.  Patient to improve R shoulder AROM to WNL without pain provocation to allow for increased ease of ADLs.  Baseline: Refer to above UE ROM table Goal status: INITIAL  5.  Patient will demonstrate improved R shoulder strength to >/= 5/5 for functional UE use. Baseline: Refer to above UE MMT table Goal status: INITIAL  6  Patient will report </= 20 % on QuickDASH (MCID = 14%) to demonstrate improved functional ability.  Baseline: 38.6 / 100 = 38.6 % Goal status: INITIAL  PLAN:  PT FREQUENCY: 1-2x/week  PT DURATION: 8 weeks  PLANNED INTERVENTIONS: 97164- PT Re-evaluation, 97750- Physical Performance Testing, 97110-Therapeutic exercises, 97530- Therapeutic activity, [id number]- Neuromuscular re-education, 97535- Self Care, 02859- Manual therapy, G0283- Electrical stimulation (unattended), 97016- Vasopneumatic device, [id number]- Ultrasound, [id number]- Traction (mechanical), [id number]- Ionotophoresis 4mg /ml Dexamethasone , 79439 (1-2 muscles), 20561 (3+ muscles)- Dry  Needling, Patient/Family education, Balance training, Taping, Joint mobilization, Spinal mobilization, Cryotherapy, and Moist heat  PLAN FOR NEXT SESSION:    Will hold further therapy per patient request.   She will have the option to call back an make appointment PRN for another 1 month.   She verbalizes understanding and agreement.   D/C PT  PHYSICAL THERAPY DISCHARGE SUMMARY  Visits from Start of Care: 2  Current functional level related to goals / functional outcomes: Patient came in for 2 PT visits for RTC tendonitis.   She did not want to make any further appointments although I think she would have benefited from more therapy.   She was advised to call back PRN and we would be happy to see her   Remaining deficits: ***   Education / Equipment: ***   Patient agrees to discharge. Patient goals were {OP Goals:25702::met}. Patient is being discharged due to {OP Discharge Reasons:25703::meeting the stated rehab goals.}   Heavan Francom, PT 12/02/2023, 12:44 PM

## 2023-12-05 ENCOUNTER — Encounter: Payer: Self-pay | Admitting: Family Medicine

## 2023-12-23 ENCOUNTER — Ambulatory Visit: Payer: Medicare Other | Admitting: *Deleted

## 2023-12-23 VITALS — BP 140/50 | HR 65 | Temp 97.6°F | Resp 16 | Ht 65.5 in | Wt 183.4 lb

## 2023-12-23 DIAGNOSIS — Z23 Encounter for immunization: Secondary | ICD-10-CM

## 2023-12-23 DIAGNOSIS — Z780 Asymptomatic menopausal state: Secondary | ICD-10-CM

## 2023-12-23 DIAGNOSIS — Z0000 Encounter for general adult medical examination without abnormal findings: Secondary | ICD-10-CM | POA: Diagnosis not present

## 2023-12-23 NOTE — Patient Instructions (Addendum)
 Ms. Ishee , Thank you for taking time out of your busy schedule to complete your Annual Wellness Visit with me. I enjoyed our conversation and look forward to speaking with you again next year. I, as well as your care team,  appreciate your ongoing commitment to your health goals. Please review the following plan we discussed and let me know if I can assist you in the future. Your Game plan/ To Do List    Referrals: If you haven't heard from the office you've been referred to, please reach out to them at the phone provided.   Bone density ([facility]):  [phone].  Follow up Visits: Next Medicare AWV with our clinical staff: Please call [phone] to schedule your next AWV after 12/23/2024.  Next Office Visit with your provider: 04/19/24/ 8:40am, Dr Watt.  Clinician Recommendations:  Aim for 30 minutes of exercise or brisk walking, 6-8 glasses of water , and 5 servings of fruits and vegetables each day.       This is a list of the screening recommended for you and due dates:  Health Maintenance  Topic Date Due   COVID-19 Vaccine (3 - Pfizer risk series) 05/15/2019   Flu Shot  10/10/2023   Medicare Annual Wellness Visit  12/19/2023   DTaP/Tdap/Td vaccine (3 - Td or Tdap) 08/11/2030   Pneumococcal Vaccine for age over 57  Completed   DEXA scan (bone density measurement)  Completed   Zoster (Shingles) Vaccine  Completed   Meningitis B Vaccine  Aged Out   Breast Cancer Screening  Discontinued   Hepatitis C Screening  Discontinued    Advanced directives: (Copy Requested) Please bring a copy of your health care power of attorney and living will to the office to be added to your chart at your convenience. You can mail to [facility] [address] [place], [place] [address] or email to [email or web address] Advance Care Planning is important because it:  [x]  Makes sure you receive the medical care that is consistent with your values, goals, and  preferences  [x]  It provides guidance to your family and loved ones and reduces their decisional burden about whether or not they are making the right decisions based on your wishes.  Follow the link provided in your after visit summary or read over the paperwork we have mailed to you to help you started getting your Advance Directives in place. If you need assistance in completing these, please reach out to us  so that we can help you!  See attachments for Preventive Care and Fall Prevention Tips.

## 2023-12-23 NOTE — Progress Notes (Signed)
 Please attest this visit in the absence of patient primary care provider.    Subjective:   Nicole Brennan is a 83 y.o. who presents for a Medicare Wellness preventive visit.  As a reminder, Annual Wellness Visits don't include a physical exam, and some assessments may be limited, especially if this visit is performed virtually. We may recommend an in-person follow-up visit with your provider if needed.  Visit Complete: In person  Persons Participating in Visit: Patient.  AWV Questionnaire: No: Patient Medicare AWV questionnaire was not completed prior to this visit.  Cardiac Risk Factors include: advanced age (>24men, >2 women);Other (see comment), Risk factor comments: Hx of endometrial cancer     Objective:    Today's Vitals   12/23/23 1456 12/23/23 1531  BP: (!) 144/55 (!) 140/50  Pulse: 65   Resp: 16   Temp: 97.6 F (36.4 C)   TempSrc: Oral   SpO2: 100%   Weight: 183 lb 6.4 oz (83.2 kg)   Height: 5' 5.5 (1.664 m)    Body mass index is 30.06 kg/m.     12/23/2023    3:44 PM 11/04/2023    9:48 AM 11/03/2023   10:12 AM 12/19/2022    3:14 PM 11/04/2022   10:45 AM 12/03/2021    9:13 AM 11/05/2021   10:53 AM  Advanced Directives  Does Patient Have a Medical Advance Directive? Yes Yes Yes Yes Yes Yes Yes  Type of Estate agent of [place];Living will Living will;Healthcare Power of [employer] Power of [place];Living will  Healthcare Power of [place];Living will Healthcare Power of [place];Living will  Does patient want to make changes to medical advance directive? No - Patient declined No - Patient declined No - Patient declined  No - Patient declined    Copy of Healthcare Power of Attorney in Chart? No - copy requested No - copy requested  No - copy requested  No - copy requested     Current Medications (verified) Outpatient Encounter Medications as of 12/23/2023  Medication Sig   levothyroxine  (SYNTHROID ) 75 MCG tablet Take 1 tablet  (75 mcg total) by mouth daily before breakfast.   VEVYE 0.1 % SOLN Apply 1 drop to eye 2 (two) times daily.   No facility-administered encounter medications on file as of 12/23/2023.    Allergies (verified) Penicillins   History: Past Medical History:  Diagnosis Date   Allergy    Arthritis    Cataract    History of radiation therapy 09/30/2018-10/28/2018   Vaginal cuff HDR; Dr. Lynwood Nasuti   Thyroid  disease    Past Surgical History:  Procedure Laterality Date   DG  BONE DENSITY ([facility] HX)     KNEE SURGERY     ROBOTIC ASSISTED TOTAL HYSTERECTOMY WITH BILATERAL SALPINGO OOPHERECTOMY Bilateral 08/20/2018   Procedure: XI ROBOTIC ASSISTED TOTAL HYSTERECTOMY WITH BILATERAL SALPINGO OOPHORECTOMY;  Surgeon: Eloy Herring, MD;  Location: [facility];  Service: Gynecology;  Laterality: Bilateral;   SENTINEL NODE BIOPSY N/A 08/20/2018   Procedure: SENTINEL LYMPH  NODE BIOPSY;  Surgeon: Eloy Herring, MD;  Location: [facility];  Service: Gynecology;  Laterality: N/A;   TUBAL LIGATION     Family History  Problem Relation Age of Onset   Vascular Disease Mother    Colon cancer Mother    Dementia Father    Social History   Socioeconomic History   Marital status: Married    Spouse name: Not on file   Number of children: Not on file  Years of education: Not on file   Highest education level: Not on file  Occupational History   Not on file  Tobacco Use   Smoking status: Never   Smokeless tobacco: Never  Vaping Use   Vaping status: Never Used  Substance and Sexual Activity   Alcohol use: No   Drug use: No   Sexual activity: Yes  Other Topics Concern   Not on file  Social History Narrative   Not on file   Social Drivers of Health   Financial Resource Strain: Low Risk  (12/23/2023)   Overall Financial Resource Strain (CARDIA)    Difficulty of Paying Living Expenses: Not very hard  Food Insecurity: No Food Insecurity (12/23/2023)   Hunger Vital Sign    Worried About Running Out of Food  in the Last Year: Never true    Ran Out of Food in the Last Year: Never true  Transportation Needs: No Transportation Needs (12/23/2023)   PRAPARE - Administrator, Civil Service (Medical): No    Lack of Transportation (Non-Medical): No  Physical Activity: Sufficiently Active (12/23/2023)   Exercise Vital Sign    Days of Exercise per Week: 5 days    Minutes of Exercise per Session: 30 min  Stress: No Stress Concern Present (12/23/2023)   [employer] of Occupational Health - Occupational Stress Questionnaire    Feeling of Stress: Not at all  Social Connections: Socially Integrated (12/23/2023)   Social Connection and Isolation Panel    Frequency of Communication with Friends and Family: More than three times a week    Frequency of Social Gatherings with Friends and Family: More than three times a week    Attends Religious Services: More than 4 times per year    Active Member of [employer] or Organizations: Yes    Attends Engineer, structural: More than 4 times per year    Marital Status: Married    Tobacco Counseling Counseling given: Not Answered    Clinical Intake:  Pre-visit preparation completed: No  Pain : No/denies pain     BMI - recorded: 30.06 Nutritional Status: BMI > 30  Obese Nutritional Risks: None Diabetes: No  Lab Results  Component Value Date   HGBA1C 6.1 10/13/2023   HGBA1C 6.0 04/07/2023   HGBA1C 5.9 09/30/2022     How often do you need to have someone help you when you read instructions, pamphlets, or other written materials from your doctor or pharmacy?: 1 - Never  Interpreter Needed?: No  Information entered by :: Lolita Libra, CMA(AAMA)   Activities of Daily Living     12/23/2023    3:09 PM  In your present state of health, do you have any difficulty performing the following activities:  Hearing? 0  Vision? 0  Difficulty concentrating or making decisions? 0  Walking or climbing stairs? 0  Dressing or bathing?  0  Doing errands, shopping? 0  Preparing Food and eating ? N  Using the Toilet? N  In the past six months, have you accidently leaked urine? N  Do you have problems with loss of bowel control? N  Managing your Medications? N  Managing your Finances? N  Housekeeping or managing your Housekeeping? N    Patient Care Team: Copland, Harlene BROCKS, MD as PCP - General (Family Medicine) Sheril Coy, MD as Consulting Physician (Orthopedic Surgery) [facility] (Optometry)  I have updated your Care Teams any recent Medical Services you may have received from other providers in the past  year.     Assessment:   This is a routine wellness examination for [place].  Hearing/Vision screen Hearing Screening - Comments:: May have some slight decrease of hearing over the last year.  Wants to wait until next year for formal testing.  Vision Screening - Comments:: Up to date with routine eye exams with [facility] and Dr Meridee.   Goals Addressed             This Visit's Progress    Maintain healthy active lifestyle.   On track    Patient Stated   On track    Drink more water        Depression Screen     12/23/2023    3:19 PM 04/07/2023    9:05 AM 12/19/2022    3:11 PM 09/30/2022    8:49 AM 03/25/2022    8:58 AM 12/03/2021    9:16 AM 10/17/2020    8:29 AM  PHQ 2/9 Scores  PHQ - 2 Score 0 0 0 0 0 0 0  PHQ- 9 Score 0          Fall Risk     12/23/2023    3:20 PM 04/07/2023    9:05 AM 12/19/2022    3:13 PM 09/30/2022    8:49 AM 03/25/2022    8:58 AM  Fall Risk   Falls in the past year? 1 0 0 0 0  Number falls in past yr: 0 0 0 0 0  Injury with Fall? 1 0 0 0   Comment but did not need to seek care      Risk for fall due to :  No Fall Risks No Fall Risks No Fall Risks No Fall Risks  Follow up Education provided Falls evaluation completed Falls prevention discussed Falls evaluation completed Falls evaluation completed      Data saved with a previous flowsheet row definition    MEDICARE  RISK AT HOME:  Medicare Risk at Home Any stairs in or around the home?: Yes If so, are there any without handrails?: No Home free of loose throw rugs in walkways, pet beds, electrical cords, etc?: Yes Adequate lighting in your home to reduce risk of falls?: Yes Life alert?: No Use of a cane, walker or w/c?: No Grab bars in the bathroom?: No Shower chair or bench in shower?: No Elevated toilet seat or a handicapped toilet?: No  TIMED UP AND GO:  Was the test performed?  Yes  Length of time to ambulate 10 feet: 7 sec Gait steady and fast without use of assistive device  Cognitive Function: 6CIT completed    07/01/2016   11:22 AM  MMSE - Mini Mental State Exam  Orientation to time 5   Orientation to Place 5   Registration 3   Attention/ Calculation 5   Recall 3   Language- name 2 objects 2   Language- repeat 1  Language- follow 3 step command 3   Language- read & follow direction 1   Write a sentence 1   Copy design 1   Total score 30      Data saved with a previous flowsheet row definition        12/23/2023    3:20 PM 12/19/2022    3:15 PM 12/03/2021    9:20 AM  6CIT Screen  What Year? 0 points 0 points 0 points  What month? 0 points 0 points 0 points  What time? 0 points 0 points 0 points  Count back from  20 0 points 0 points 0 points  Months in reverse 0 points 0 points 0 points  Repeat phrase 0 points 0 points 0 points  Total Score 0 points 0 points 0 points    Immunizations Immunization History  Administered Date(s) Administered   Fluad Quad(high Dose 65+) 12/01/2018, 12/09/2019, 03/25/2022   INFLUENZA, HIGH DOSE SEASONAL PF 01/22/2016, 12/26/2016, 12/30/2017, 12/25/2022, 12/23/2023   Influenza Whole 12/10/2011   Influenza,inj,Quad PF,6+ Mos 12/21/2013, 11/15/2014   PFIZER(Purple Top)SARS-COV-2 Vaccination 03/27/2019, 04/17/2019   Pneumococcal Conjugate-13 10/25/2013   Pneumococcal Polysaccharide-23 10/15/2017   Pneumococcal-Unspecified 03/12/2003   Td  08/10/2020   Tdap 03/12/2007   Zoster Recombinant(Shingrix ) 01/10/2022, 04/07/2022   Zoster, Live 03/11/2008    Screening Tests Health Maintenance  Topic Date Due   Medicare Annual Wellness (AWV)  12/19/2023   COVID-19 Vaccine (3 - Pfizer risk series) 12/22/2024 (Originally 05/15/2019)   DTaP/Tdap/Td (3 - Td or Tdap) 08/11/2030   Pneumococcal Vaccine: 50+ Years  Completed   Influenza Vaccine  Completed   DEXA SCAN  Completed   Zoster Vaccines- Shingrix   Completed   Meningococcal B Vaccine  Aged Out   Mammogram  Discontinued   Hepatitis C Screening  Discontinued    Health Maintenance Items Addressed: Flu vaccine given today. Bone Density ordered.  Additional Screening:  Vision Screening: Recommended annual ophthalmology exams for early detection of glaucoma and other disorders of the eye. Is the patient up to date with their annual eye exam?  Yes  Who is the provider or what is the name of the office in which the patient attends annual eye exams? [facility]  Dental Screening: Recommended annual dental exams for proper oral hygiene  Community Resource Referral / Chronic Care Management: CRR required this visit?  No   CCM required this visit?  No   Plan:    I have personally reviewed and noted the following in the patient's chart:   Medical and social history Use of alcohol, tobacco or illicit drugs  Current medications and supplements including opioid prescriptions. Patient is not currently taking opioid prescriptions. Functional ability and status Nutritional status Physical activity Advanced directives List of other physicians Hospitalizations, surgeries, and ER visits in previous 12 months Vitals Screenings to include cognitive, depression, and falls Referrals and appointments  In addition, I have reviewed and discussed with patient certain preventive protocols, quality metrics, and best practice recommendations. A written personalized care plan for preventive  services as well as general preventive health recommendations were provided to patient.   Lolita Libra, CMA   12/23/2023   After Visit Summary: (In Person-Printed) AVS printed and given to the patient  Notes: Nothing significant to report at this time.

## 2024-01-05 ENCOUNTER — Telehealth (HOSPITAL_BASED_OUTPATIENT_CLINIC_OR_DEPARTMENT_OTHER): Payer: Self-pay

## 2024-01-06 ENCOUNTER — Telehealth (HOSPITAL_BASED_OUTPATIENT_CLINIC_OR_DEPARTMENT_OTHER): Payer: Self-pay

## 2024-01-19 ENCOUNTER — Telehealth: Payer: Self-pay | Admitting: Family Medicine

## 2024-01-19 NOTE — Telephone Encounter (Signed)
 Copied from CRM [id number]. Topic: General - Other >> Jan 19, 2024  9:34 AM Drema MATSU wrote: Reason for CRM: Patient wants to know if her bone density test is covered under insurance.

## 2024-01-20 ENCOUNTER — Telehealth: Payer: Self-pay | Admitting: *Deleted

## 2024-01-20 NOTE — Telephone Encounter (Signed)
 Pt called stating her insurance told her she may be responsible to pay anywhere up to $300 for the bone density and she is not able to afford that at this time. Pt will let us  know if insurance / circumstances change.

## 2024-01-21 NOTE — Telephone Encounter (Signed)
 Mychart message sent to pt.

## 2024-02-16 ENCOUNTER — Other Ambulatory Visit (HOSPITAL_BASED_OUTPATIENT_CLINIC_OR_DEPARTMENT_OTHER): Payer: Self-pay

## 2024-02-16 ENCOUNTER — Other Ambulatory Visit: Payer: Self-pay

## 2024-04-15 NOTE — Patient Instructions (Addendum)
 It was great to see you again today as always.  I will be in touch with your lab work Recommend 1 dose of RSV vaccine at your pharmacy if not done already If all is well please see me in about 6 months

## 2024-04-15 NOTE — Progress Notes (Unsigned)
 [facility] [address], [address] [place], [place] [address] [phone]  Date:  04/19/2024   Name:  Nicole Brennan   DOB:  Aug 25, 1940   MRN:  [id number]  PCP:  Watt Harlene BROCKS, MD    Chief Complaint: No chief complaint on file.   History of Present Illness:  Nicole Brennan is a 84 y.o. very pleasant female patient who presents with the following:  Patient seen today for periodic follow-up.  I saw her most recently in August  Married to [place], they both continue to work. She is a professional pianist    History of hypothyroidism, osteoporosis, endometrial cancer dx 07/2018, prediabetes  She is seen annually by radiation oncology at this time-visit with Dr. Shannon was in August 2025.  She is now about 5.5 years out from her last radiation treatment She is also followed by gynecologic oncology, Dr. Demetrio believe from recent visit was in May 2025  Mammogram 7/25 Bone density 9/23, can update-osteopenia She completed a Cologuard in 7/24 Some blood work on chart from August, BMP, ferritin, B12, CBC, A1c 6.1%, TSH  Her only current medication is levothyroxine  76 and an eyedrop  Discussed the use of AI scribe software for clinical note transcription with the patient, who gave verbal consent to proceed.  History of Present Illness     Patient Active Problem List   Diagnosis Date Noted   Prediabetes 09/19/2020   Lichen sclerosus 08/03/2019   Endometrial carcinoma (HCC) 08/05/2018   Obesity (BMI 30.0-34.9) 08/05/2018   Seasonal allergies 04/21/2012   Cataract 04/21/2012   Osteoporosis 05/01/2011   Hypothyroidism 04/30/2011   Osteoarthritis 04/30/2011    Past Medical History:  Diagnosis Date   Allergy    Arthritis    Cataract    History of radiation therapy 09/30/2018-10/28/2018   Vaginal cuff HDR; Dr. Lynwood Shannon   Thyroid  disease     Past Surgical History:  Procedure Laterality Date   DG  BONE DENSITY  ([facility] HX)     KNEE SURGERY     ROBOTIC ASSISTED TOTAL HYSTERECTOMY WITH BILATERAL SALPINGO OOPHERECTOMY Bilateral 08/20/2018   Procedure: XI ROBOTIC ASSISTED TOTAL HYSTERECTOMY WITH BILATERAL SALPINGO OOPHORECTOMY;  Surgeon: Eloy Herring, MD;  Location: [facility];  Service: Gynecology;  Laterality: Bilateral;   SENTINEL NODE BIOPSY N/A 08/20/2018   Procedure: SENTINEL LYMPH  NODE BIOPSY;  Surgeon: Eloy Herring, MD;  Location: [facility];  Service: Gynecology;  Laterality: N/A;   TUBAL LIGATION      Social History[1]  Family History  Problem Relation Age of Onset   Vascular Disease Mother    Colon cancer Mother    Dementia Father     Allergies[2]  Medication list has been reviewed and updated.  Medications Ordered Prior to Encounter[3]  Review of Systems:  As per HPI- otherwise negative.   Physical Examination: There were no vitals filed for this visit. There were no vitals filed for this visit. There is no height or weight on file to calculate BMI. Ideal Body Weight:    GEN: no acute distress. HEENT: Atraumatic, Normocephalic.  Ears and Nose: No external deformity. CV: RRR, No M/G/R. No JVD. No thrill. No extra heart sounds. PULM: CTA B, no wheezes, crackles, rhonchi. No retractions. No resp. distress. No accessory muscle use. ABD: S, NT, ND, +BS. No rebound. No HSM. EXTR: No c/c/e PSYCH: Normally interactive. Conversant.    Assessment and Plan: No diagnosis found.  Assessment & Plan   Signed Harlene Schroeder, MD    [1]  Social History Tobacco Use   Smoking status: Never   Smokeless tobacco: Never  Vaping Use   Vaping status: Never Used  Substance Use Topics   Alcohol use: No   Drug use: No  [2]  Allergies Allergen Reactions   Penicillins Hives    Did it involve swelling of the face/tongue/throat, SOB, or low BP? No Did it involve sudden or severe rash/hives, skin peeling, or any reaction on the inside of your mouth or nose? Unknown Did you need to seek  medical attention at a hospital or doctor's office? Yes When did it last happen?Childhood reaction If all above answers are NO, may proceed with cephalosporin use.   [3]  Current Outpatient Medications on File Prior to Visit  Medication Sig Dispense Refill   levothyroxine  (SYNTHROID ) 75 MCG tablet Take 1 tablet (75 mcg total) by mouth daily before breakfast. 90 tablet 1   VEVYE 0.1 % SOLN Apply 1 drop to eye 2 (two) times daily.     No current facility-administered medications on file prior to visit.   "

## 2024-04-19 ENCOUNTER — Ambulatory Visit: Admitting: Family Medicine

## 2024-04-19 DIAGNOSIS — Z130 Encounter for screening for diseases of the blood and blood-forming organs and certain disorders involving the immune mechanism: Secondary | ICD-10-CM

## 2024-04-19 DIAGNOSIS — Z13220 Encounter for screening for lipoid disorders: Secondary | ICD-10-CM

## 2024-04-19 DIAGNOSIS — E038 Other specified hypothyroidism: Secondary | ICD-10-CM

## 2024-04-19 DIAGNOSIS — R7303 Prediabetes: Secondary | ICD-10-CM

## 2024-11-02 ENCOUNTER — Inpatient Hospital Stay: Admitting: Gynecologic Oncology
